# Patient Record
Sex: Male | Born: 1945
Health system: Southern US, Community
[De-identification: ages and names within clinical notes are randomized; demographics above are authoritative.]

## PROBLEM LIST (undated history)

## (undated) DIAGNOSIS — E119 Type 2 diabetes mellitus without complications: Secondary | ICD-10-CM

## (undated) DIAGNOSIS — I639 Cerebral infarction, unspecified: Secondary | ICD-10-CM

## (undated) DIAGNOSIS — C4490 Unspecified malignant neoplasm of skin, unspecified: Secondary | ICD-10-CM

## (undated) DIAGNOSIS — H269 Unspecified cataract: Secondary | ICD-10-CM

## (undated) DIAGNOSIS — H3320 Serous retinal detachment, unspecified eye: Secondary | ICD-10-CM

## (undated) HISTORY — DX: Cerebral infarction, unspecified: I63.9

## (undated) HISTORY — DX: Serous retinal detachment, unspecified eye: H33.20

## (undated) HISTORY — PX: TONSILLECTOMY: SUR1361

## (undated) HISTORY — DX: Unspecified cataract: H26.9

## (undated) HISTORY — PX: OTHER SURGICAL HISTORY: SHX169

## (undated) HISTORY — DX: Unspecified malignant neoplasm of skin, unspecified: C44.90

## (undated) HISTORY — PX: EYE SURGERY: SHX253

## (undated) HISTORY — DX: Type 2 diabetes mellitus without complications: E11.9

## (undated) SURGERY — Surgical Case
Anesthesia: *Unknown

---

## 1998-01-15 ENCOUNTER — Encounter: Admission: RE | Admit: 1998-01-15 | Discharge: 1998-04-15 | Payer: Self-pay | Admitting: Pulmonary Disease

## 2000-09-08 HISTORY — PX: COLONOSCOPY: SHX174

## 2001-01-21 ENCOUNTER — Other Ambulatory Visit: Admission: RE | Admit: 2001-01-21 | Discharge: 2001-01-21 | Payer: Self-pay | Admitting: Gastroenterology

## 2001-01-21 ENCOUNTER — Encounter (INDEPENDENT_AMBULATORY_CARE_PROVIDER_SITE_OTHER): Payer: Self-pay | Admitting: Specialist

## 2001-04-14 ENCOUNTER — Encounter: Payer: Self-pay | Admitting: Emergency Medicine

## 2001-04-14 ENCOUNTER — Emergency Department (HOSPITAL_COMMUNITY): Admission: EM | Admit: 2001-04-14 | Discharge: 2001-04-14 | Payer: Self-pay | Admitting: Emergency Medicine

## 2001-04-16 ENCOUNTER — Ambulatory Visit (HOSPITAL_COMMUNITY): Admission: RE | Admit: 2001-04-16 | Discharge: 2001-04-16 | Payer: Self-pay | Admitting: Urology

## 2001-04-16 ENCOUNTER — Encounter: Payer: Self-pay | Admitting: Urology

## 2001-04-22 ENCOUNTER — Encounter: Payer: Self-pay | Admitting: Urology

## 2001-04-22 ENCOUNTER — Ambulatory Visit (HOSPITAL_COMMUNITY): Admission: RE | Admit: 2001-04-22 | Discharge: 2001-04-22 | Payer: Self-pay | Admitting: Urology

## 2008-01-11 ENCOUNTER — Emergency Department (HOSPITAL_COMMUNITY): Admission: EM | Admit: 2008-01-11 | Discharge: 2008-01-11 | Payer: Self-pay | Admitting: Emergency Medicine

## 2009-09-27 ENCOUNTER — Encounter: Admission: RE | Admit: 2009-09-27 | Discharge: 2009-09-27 | Payer: Self-pay | Admitting: Internal Medicine

## 2009-11-01 ENCOUNTER — Encounter: Payer: Self-pay | Admitting: Internal Medicine

## 2010-09-29 ENCOUNTER — Encounter: Payer: Self-pay | Admitting: Internal Medicine

## 2010-10-08 NOTE — Letter (Signed)
Summary: Pre Visit No Show Letter  [facility]  [address] [place], [place] [address]   Phone: [phone]  Fax: [phone]        November 01, 2009 MRN: [id number]    [facility] [place] [address] [place], [place]  [address]    Dear Mr. Wangerin,   We have been unable to reach you by phone concerning the pre-procedure visit that you missed on 11/01/09. For this reason,your procedure scheduled on 11/15/09 has been cancelled. Our scheduling staff will gladly assist you with rescheduling your appointments at a more convenient time. Please call our office at [phone] between the hours of 8:00am and 5:00pm, press option #2 to reach an appointment scheduler. Please consider updating your contact numbers at this time so that we can reach you by phone in the future with schedule changes or results.    Thank you,    Wyona Almas RN [facility]

## 2011-01-24 NOTE — Op Note (Signed)
[facility]  Patient:    James Blevins, James Blevins                     MRN: [id number] Proc. Date: 04/16/01 Adm. Date:  [id number] Attending:  Ellwood Handler                           Operative Report  DATE OF BIRTH:  23-Mar-1946  REFERRING PHYSICIAN:  ______  Margarette AsalVerl Dicker, M.D.  PREOPERATIVE DIAGNOSES:  A 65 year old diabetic with 7 mm left UPJ stone, 5 mm left renal stone.  POSTOPERATIVE DIAGNOSES:  A 65 year old diabetic with 7 mm left UPJ stone, 5 mm left renal stone.  PROCEDURE:  Cystoscopy, retrograde, left double J stent placement.  ANESTHESIA:  General.  DRAINS:  A 6 French 26 cm double J stent.  DESCRIPTION OF PROCEDURE:  The patient was prepped and draped in the dorsal lithotomy position after institution of an adequate level of general anesthesia. A well lubricated 21 French panendoscope was gently inserted at the urethral meatus, normal urethra and sphincter, nonobstructive prostate. The bladder showed a normal trigone, orifices and urothelium. Right retrograde showed normal course and caliber of the ureter, pelvis, and calices with prompt drainage of 3-5 minutes. Left retrograde showed tightly impacted 7 mm calculus in the region of the left UPJ with smaller 5 mm calculus in the mid pole of the left kidney. Gentle injection of contrast displaced the left UPJ stone into the left mid pole with prompt efflux of concentrated urine at the left ureteral orifice. The guidewire was advanced into the upper pole calices. A 6 French 26 cm double J stent was then advanced over the indwelling guidewire with excellent pigtail formation on guidewire removal. The bladder was drained. The cystoscope was removed and the patient was returned to recovery in satisfactory condition. DD:  04/16/01 TD:  04/16/01 Job: [id number]

## 2011-10-13 ENCOUNTER — Other Ambulatory Visit: Payer: Self-pay | Admitting: Dermatology

## 2012-05-25 ENCOUNTER — Other Ambulatory Visit: Payer: Self-pay | Admitting: Dermatology

## 2013-10-10 ENCOUNTER — Encounter: Payer: Self-pay | Admitting: Internal Medicine

## 2013-10-24 ENCOUNTER — Other Ambulatory Visit: Payer: Self-pay | Admitting: Dermatology

## 2013-11-08 ENCOUNTER — Ambulatory Visit (AMBULATORY_SURGERY_CENTER): Payer: Self-pay | Admitting: *Deleted

## 2013-11-08 VITALS — Ht 72.0 in | Wt 187.8 lb

## 2013-11-08 DIAGNOSIS — Z1211 Encounter for screening for malignant neoplasm of colon: Secondary | ICD-10-CM

## 2013-11-08 MED ORDER — MOVIPREP 100 G PO SOLR: 1.0000 | Freq: Once | ORAL | Status: DC

## 2013-11-08 NOTE — Progress Notes (Signed)
Denies allergies to eggs or soy products. Denies complications with sedation or anesthesia. 

## 2013-11-11 ENCOUNTER — Encounter: Payer: Self-pay | Admitting: Internal Medicine

## 2013-11-17 ENCOUNTER — Encounter: Payer: Self-pay | Admitting: Internal Medicine

## 2013-11-17 ENCOUNTER — Ambulatory Visit (AMBULATORY_SURGERY_CENTER): Payer: Medicare Other | Admitting: Internal Medicine

## 2013-11-17 VITALS — BP 163/96 | HR 72 | Temp 97.9°F | Resp 17 | Ht 72.0 in | Wt 187.0 lb

## 2013-11-17 DIAGNOSIS — D126 Benign neoplasm of colon, unspecified: Secondary | ICD-10-CM

## 2013-11-17 DIAGNOSIS — Z86010 Personal history of colonic polyps: Secondary | ICD-10-CM

## 2013-11-17 DIAGNOSIS — Z1211 Encounter for screening for malignant neoplasm of colon: Secondary | ICD-10-CM

## 2013-11-17 LAB — GLUCOSE, CAPILLARY
GLUCOSE-CAPILLARY: 243 mg/dL — AB (ref 70–99)
GLUCOSE-CAPILLARY: 249 mg/dL — AB (ref 70–99)
Glucose-Capillary: 206 mg/dL — ABNORMAL HIGH (ref 70–99)

## 2013-11-17 MED ORDER — SODIUM CHLORIDE 0.9 % IV SOLN: 500.0000 mL | INTRAVENOUS | Status: DC

## 2013-11-17 NOTE — Patient Instructions (Signed)

## 2013-11-17 NOTE — Progress Notes (Signed)
Procedure ends. To recovery, report given and VSS. 

## 2013-11-17 NOTE — Op Note (Signed)
[facility]  [address] [employer] [address] [place], [address]   COLONOSCOPY PROCEDURE REPORT  PATIENT: James, Blevins  MR#: [id number] BIRTHDATE: 04/30/1946 , 67  yrs. old GENDER: Male ENDOSCOPIST: Eustace Quail, MD REFERRED DU:[id number] Avva, M.D. PROCEDURE DATE:  11/17/2013 PROCEDURE:   Colonoscopy with snare polypectomy x 1 First Screening Colonoscopy - Avg.  risk and is 50 yrs.  old or older - No.  Prior Negative Screening - Now for repeat screening. N/A  History of Adenoma - Now for follow-up colonoscopy & has been > or = to 3 yrs.  Yes hx of adenoma.  Has been 3 or more years since last colonoscopy.  Polyps Removed Today? Yes. ASA CLASS:   Class II INDICATIONS:Patient's personal history of colon polyps. Index 2002 [facility]) small adenomas MEDICATIONS: MAC sedation, administered by CRNA and propofol (Diprivan) 200mg  IV  DESCRIPTION OF PROCEDURE:   After the risks benefits and alternatives of the procedure were thoroughly explained, informed consent was obtained.  A digital rectal exam revealed no abnormalities of the rectum.   The LB [id number]  endoscope was introduced through the anus and advanced to the cecum, which was identified by both the appendix and ileocecal valve. No adverse events experienced.   The quality of the prep was adequate, using MoviPrep  The instrument was then slowly withdrawn as the colon was fully examined.   COLON FINDINGS: A sessile polyp measuring 7 mm in size was found in the ascending colon.  A polypectomy was performed with a cold snare.  The resection was complete and the polyp tissue was completely retrieved.   The colon mucosa was otherwise normal. Retroflexed views revealed internal hemorrhoids. The time to cecum=3 minutes 16 seconds.  Withdrawal time=12 minutes 43 seconds. The scope was withdrawn and the procedure completed. COMPLICATIONS: There were no complications.  ENDOSCOPIC IMPRESSION: 1.   Sessile polyp measuring 7  mm in size was found in the ascending colon; polypectomy was performed with a cold snare 2.   The colon mucosa was otherwise normal  RECOMMENDATIONS: 1. Follow up colonoscopy in 5 years   eSigned:  Eustace Quail, MD 11/17/2013 12:23 PM   cc: [name], MD and The Patient

## 2013-11-17 NOTE — Progress Notes (Signed)
Called to room to assist during endoscopic procedure.  Patient ID and intended procedure confirmed with present staff. Received instructions for my participation in the procedure from the performing physician.  

## 2013-11-18 ENCOUNTER — Telehealth: Payer: Self-pay | Admitting: *Deleted

## 2013-11-18 NOTE — Telephone Encounter (Signed)
Left message that we called for f/u 

## 2013-11-23 ENCOUNTER — Encounter: Payer: Self-pay | Admitting: Internal Medicine

## 2015-08-15 ENCOUNTER — Ambulatory Visit: Payer: Medicare Other | Attending: Neurology

## 2015-08-15 DIAGNOSIS — R41841 Cognitive communication deficit: Secondary | ICD-10-CM | POA: Diagnosis present

## 2015-08-15 NOTE — Therapy (Signed)
[facility] [address] [place], [place], [address] Phone: [phone]   Fax:  [phone]  Speech Language Pathology Evaluation  Patient Details  Name: James Blevins MRN: [id number] Date of Birth: 01/31/1946 Referring Provider: Joyce Copa M.D.  Encounter Date: 08/15/2015      End of Session - 08/15/15 1212    Visit Number 1   Number of Visits 17   Date for SLP Re-Evaluation 10/15/15   Authorization Type blue medicare   SLP Start Time 4   SLP Stop Time  1101   SLP Time Calculation (min) 42 min   Activity Tolerance Patient tolerated treatment well      Past Medical History  Diagnosis Date  . Diabetes ([place])   . Skin cancer   . Cataracts, bilateral   . Retinal detachment left eye, 08/2013  . Stroke [facility])     Past Surgical History  Procedure Laterality Date  . Tonsillectomy  childhood  . Colonoscopy  2002  . Skin cancer removal    . Eye surgery      There were no vitals filed for this visit.  Visit Diagnosis: Cognitive communication deficit      Subjective Assessment - 08/15/15 1029    Subjective "Not anything with my speech." "This last Friday - Friday before Thanksgiving."   Patient is accompained by: Family member            SLP Evaluation [facility] - 08/15/15 1028    SLP Visit Information   SLP Received On 08/15/15   Referring Provider Joyce Copa M.D.   Onset Date 08-03-15   Medical Diagnosis CVA   Prior Functional Status   Cognitive/Linguistic Baseline Within functional limits    Lives With Spouse   Available Support Family   Vocation Full time employment   Cognition   Overall Cognitive Status Impaired/Different from baseline   Area of Impairment Awareness;Memory;Problem solving   Memory Impaired   Memory Impairment Retrieval deficit;Decreased recall of new information;Decreased short term memory   Decreased Short Term Memory Verbal basic  SLP told pt he was SLP, pt recalled SLP  was OT 15 min later   Awareness Impaired   Awareness Impairment Intellectual impairment  intermittent emergent awareness   Problem Solving Impaired   Problem Solving Impairment Verbal basic;Verbal complex;Functional basic  difficulty solving necessary corrections with clock drawing   Behaviors Impulsive   Auditory Comprehension   Overall Auditory Comprehension Appears within functional limits for tasks assessed   Verbal Expression   Overall Verbal Expression Appears within functional limits for tasks assessed   Oral Motor/Sensory Function   Overall Oral Motor/Sensory Function Appears within functional limits for tasks assessed   Motor Speech   Overall Motor Speech Appears within functional limits for tasks assessed   Standardized Assessments   Standardized Assessments  Montreal Cognitive Assessment (MOCA)   Montreal Cognitive Assessment (MOCA)  21/30 (0/6 memory)                         SLP Education - 08/15/15 1211    Education provided Yes   Education Details deficit areas, therapy course, suggestion of OT referral   Person(s) Educated Patient;Spouse   Methods Explanation   Comprehension Verbalized understanding          SLP Short Term Goals - 08/15/15 1214    SLP SHORT TERM GOAL #1   Title pt will tell SLP of 4/4 memory strategies with modified independence  Time 4   Period Weeks   Status New   SLP SHORT TERM GOAL #2   Title pt will demo verbal problem solving skills adequate for 90% success in written simple deductive reasoning tasks   Time 4   Period Weeks   Status New   SLP SHORT TERM GOAL #3   Title pt will tell SLP 3 non-physical deficits   Time 4   Period Weeks   Status New          SLP Long Term Goals - 08/25/2015 1216    SLP LONG TERM GOAL #1   Title pt will demo awareness skills appropriate for anticipatory verbal safety awareness regarding deficit areas   Time 8   Period Weeks   Status New   SLP LONG TERM GOAL #2   Title pt  will utilize memory strategies/techniques twice in two sessions, or reported by famly/pt between three therapy sessions   Time 8   Period Weeks   Status New   SLP LONG TERM GOAL #3   Title pt will demo two weeks of error-free medication administration with modified independence   Time 8   Period Weeks   Status New          Plan - August 25, 2015 1210/12/19    Clinical Impression Statement Pt presents with mild-mod cognitive-linguistic deficits in the areas of memory, awareness, and problem solving. He would benefit from skilled ST to address these deficits to lessen caregiver burden and to return skills to a level he could return to work as a [employer].    Speech Therapy Frequency 2x / week   Duration --  8 weeks   Treatment/Interventions Cognitive reorganization;SLP instruction and feedback;Compensatory strategies;Internal/external aids;Patient/family education;Functional tasks;Cueing hierarchy   Potential to Achieve Goals Good          G-Codes - August 25, 2015 December 18, 1216    Functional Assessment Tool Used noms -5 (40-45% impaired)   Functional Limitations Memory   Memory Current Status [id number]) At least 40 percent but less than 60 percent impaired, limited or restricted   Memory Goal Status [id number]) At least 1 percent but less than 20 percent impaired, limited or restricted      Problem List There are no active problems to display for this patient.   [facility] , [place], [place]  2015/08/25, 12:20 PM  [facility] [address] [place], [place], [address] Phone: [phone]   Fax:  [phone]  Name: James Blevins MRN: [id number] Date of Birth: May 08, 1946

## 2015-08-15 NOTE — Patient Instructions (Signed)
Sit with James Blevins next time the medication box needs a refill and do it together

## 2015-08-17 NOTE — Addendum Note (Signed)
Addended by: Garald Balding B on: 08/17/2015 05:26 PM   Modules accepted: Orders

## 2015-08-21 ENCOUNTER — Ambulatory Visit: Payer: Medicare Other

## 2015-08-21 NOTE — Addendum Note (Signed)
Addended by: Garald Balding B on: 08/21/2015 11:49 AM   Modules accepted: Orders

## 2015-08-24 ENCOUNTER — Ambulatory Visit: Payer: Medicare Other

## 2015-08-28 ENCOUNTER — Ambulatory Visit: Payer: Medicare Other

## 2015-08-28 DIAGNOSIS — R41841 Cognitive communication deficit: Secondary | ICD-10-CM

## 2015-08-28 NOTE — Patient Instructions (Signed)
You will need to take some extra time on the front end, for tasks you have not done before, or with tasks you have not completed in a while.  You will need to double check your work as you are making mistakes you didn't make before. You will find any errors you make, just check your work.

## 2015-08-28 NOTE — Therapy (Signed)
[facility] [address] [place], [place], [address] Phone: [phone]   Fax:  [phone]  Speech Language Pathology Treatment  Patient Details  Name: James Blevins MRN: [id number] Date of Birth: [birth year] Referring Provider: Joyce Copa M.D.  Encounter Date: 08/28/2015      End of Session - 08/28/15 1152    Visit Number 2   Number of Visits 17   Date for SLP Re-Evaluation 10/15/15   Authorization Type blue medicare   Authorization - Visit Number 1   Authorization - Number of Visits 16   SLP Start Time [id number]   SLP Stop Time  1145   SLP Time Calculation (min) 42 min   Activity Tolerance Patient tolerated treatment well      Past Medical History  Diagnosis Date  . Diabetes ([place])   . Skin cancer   . Cataracts, bilateral   . Retinal detachment left eye, 08/2013  . Stroke [facility])     Past Surgical History  Procedure Laterality Date  . Tonsillectomy  childhood  . Colonoscopy  2002  . Skin cancer removal    . Eye surgery      There were no vitals filed for this visit.  Visit Diagnosis: Cognitive communication deficit      Subjective Assessment - 08/28/15 1108    Patient is accompained by: Family member  wife               ADULT SLP TREATMENT - 08/28/15 1108    General Information   Behavior/Cognition Cooperative;Pleasant mood;Alert   Treatment Provided   Treatment provided Cognitive-Linquistic   Pain Assessment   Pain Assessment No/denies pain   Cognitive-Linquistic Treatment   Treatment focused on Cognition   Skilled Treatment Simple organization task to work with pt on cognitive linguistics (categorizating words and holidays). With SLP providing pt 4-5 letter words, change combinations, and simple conversation he exhibited divided attention 80% of the time, however accuracy on holidays task was <50% due to an error in dates. Pt caught errors to improve success to 90% with cue to double  check his work. This task took pt 19 minutes to complete including corrections. Pt did not recall two details of conversation from 5 minutes earlier.   Assessment / Recommendations / Plan   Plan Continue with current plan of care   Progression Toward Goals   Progression toward goals Progressing toward goals          SLP Education - 08/28/15 1150    Education provided Yes   Education Details deficit areas, need to double check tasks   Person(s) Educated Patient;Spouse   Methods Explanation   Comprehension Verbalized understanding          SLP Short Term Goals - 08/28/15 1155    SLP SHORT TERM GOAL #1   Title pt will tell SLP of 4/4 memory strategies with modified independence   Time 4   Period Weeks   Status On-going   SLP SHORT TERM GOAL #2   Title pt will demo verbal problem solving skills adequate for 90% success in written simple deductive reasoning tasks   Time 4   Period Weeks   Status On-going   SLP SHORT TERM GOAL #3   Title pt will tell SLP 3 non-physical deficits   Time 4   Period Weeks   Status On-going          SLP Long Term Goals - 08/28/15 1156    SLP LONG TERM  GOAL #1   Title pt will demo awareness skills appropriate for anticipatory verbal safety awareness regarding deficit areas   Time 8   Period Weeks   Status On-going   SLP LONG TERM GOAL #2   Title pt will utilize memory strategies/techniques twice in two sessions, or reported by famly/pt between three therapy sessions   Time 8   Period Weeks   Status On-going   SLP LONG TERM GOAL #3   Title pt will demo two weeks of error-free medication administration with modified independence   Time 8   Period Weeks   Status On-going          Plan - 08/28/15 1152    Clinical Impression Statement Deficits continue in cognitive-linguistics and pt would continue to benefit from skilled ST in order to return pt to a level he may be able to return to work.   Speech Therapy Frequency 2x / week    Duration --  8 weeks   Treatment/Interventions Cognitive reorganization;SLP instruction and feedback;Compensatory strategies;Internal/external aids;Patient/family education;Functional tasks;Cueing hierarchy   Potential to Achieve Goals Good        Problem List There are no active problems to display for this patient.   [facility] , [place], [place]  08/28/2015, 11:57 AM  [facility] [address] [place], [place], [address] Phone: [phone]   Fax:  [phone]   Name: James Blevins MRN: [id number] Date of Birth: [birth year]

## 2015-08-30 ENCOUNTER — Ambulatory Visit: Payer: Medicare Other

## 2015-08-30 DIAGNOSIS — R41841 Cognitive communication deficit: Secondary | ICD-10-CM | POA: Diagnosis not present

## 2015-08-30 NOTE — Patient Instructions (Signed)
  Please complete the assigned speech therapy homework and return it to your next session.  

## 2015-08-30 NOTE — Therapy (Signed)
[facility] [address] [place], [place], [address] Phone: [phone]   Fax:  [phone]  Speech Language Pathology Treatment  Patient Details  Name: James Blevins MRN: [id number] Date of Birth: [birth year] Referring Provider: Joyce Copa M.D.  Encounter Date: 08/30/2015      End of Session - 08/30/15 1627    Visit Number 3   Number of Visits 17   Date for SLP Re-Evaluation 10/15/15   Authorization Type blue medicare   Authorization - Visit Number 2   Authorization - Number of Visits 16   SLP Start Time [id number]   SLP Stop Time  1615   SLP Time Calculation (min) 42 min   Activity Tolerance Patient tolerated treatment well      Past Medical History  Diagnosis Date  . Diabetes ([place])   . Skin cancer   . Cataracts, bilateral   . Retinal detachment left eye, 08/2013  . Stroke [facility])     Past Surgical History  Procedure Laterality Date  . Tonsillectomy  childhood  . Colonoscopy  2002  . Skin cancer removal    . Eye surgery      There were no vitals filed for this visit.  Visit Diagnosis: Cognitive communication deficit      Subjective Assessment - 08/30/15 1537    Subjective Pt is keeping track of own meds.   Patient is accompained by: --  wife               ADULT SLP TREATMENT - 08/30/15 1539    General Information   Behavior/Cognition Cooperative;Pleasant mood;Alert   Treatment Provided   Treatment provided Cognitive-Linquistic   Pain Assessment   Pain Assessment No/denies pain   Cognitive-Linquistic Treatment   Treatment focused on Cognition   Skilled Treatment Pt brought homework back to SLP, double-checked. Pt cont to have errors on simple task which req'd mod attention to detail. Pt found errors on one of two homework tasks within 60 seconds of checking again. In simple reasoning (garden plots) pt did not require cues. In an alternating attention task, pt was 83% successful with  emergent awareness at 100% with min SLP cues, and consistent extra time.    Assessment / Recommendations / Plan   Plan Continue with current plan of care   Progression Toward Goals   Progression toward goals Progressing toward goals            SLP Short Term Goals - 08/30/15 1628    SLP SHORT TERM GOAL #1   Title pt will tell SLP of 4/4 memory strategies with modified independence   Time 3   Period Weeks   Status On-going   SLP SHORT TERM GOAL #2   Title pt will demo verbal problem solving skills adequate for 90% success in written simple deductive reasoning tasks   Time 3   Period Weeks   Status On-going   SLP SHORT TERM GOAL #3   Title pt will tell SLP 3 non-physical deficits   Time 3   Period Weeks   Status On-going          SLP Long Term Goals - 08/30/15 1628    SLP LONG TERM GOAL #1   Title pt will demo awareness skills appropriate for anticipatory verbal safety awareness regarding deficit areas   Time 7   Period Weeks   Status On-going   SLP LONG TERM GOAL #2   Title pt will utilize memory strategies/techniques twice in two  sessions, or reported by famly/pt between three therapy sessions   Time 7   Period Weeks   Status On-going   SLP LONG TERM GOAL #3   Title pt will demo two weeks of error-free medication administration with modified independence   Time 7   Period Weeks   Status On-going          Plan - 08/30/15 1627    Clinical Impression Statement Deficits continue in cognitive-linguistics and pt would continue to benefit from skilled ST in order to return pt to a level he may be able to return to work.   Speech Therapy Frequency 2x / week   Duration --  7 weeks   Treatment/Interventions Cognitive reorganization;SLP instruction and feedback;Compensatory strategies;Internal/external aids;Patient/family education;Functional tasks;Cueing hierarchy   Potential to Achieve Goals Good        Problem List There are no active problems to display for  this patient.   [facility] , [place], [place] 08/30/2015, 4:29 PM  [facility] [address] [place], [place], [address] Phone: [phone]   Fax:  [phone]   Name: James Blevins MRN: [id number] Date of Birth: 25-Feb-1946

## 2015-09-05 ENCOUNTER — Ambulatory Visit: Payer: Medicare Other

## 2015-09-05 DIAGNOSIS — R41841 Cognitive communication deficit: Secondary | ICD-10-CM

## 2015-09-05 NOTE — Patient Instructions (Signed)
  Please complete the assigned speech therapy homework and return it to your next session.  

## 2015-09-05 NOTE — Therapy (Signed)
[facility] [address] [place], [place], [address] Phone: [phone]   Fax:  [phone]  Speech Language Pathology Treatment  Patient Details  Name: James Blevins MRN: [id number] Date of Birth: May 18, 1946 Referring Provider: Joyce Copa M.D.  Encounter Date: 09/05/2015      End of Session - 09/05/15 1655    Visit Number 4   Number of Visits 17   Date for SLP Re-Evaluation 10/15/15   Authorization Type blue medicare   Authorization Time Period until 10-25-15   Authorization - Visit Number 3   Authorization - Number of Visits 16   SLP Start Time [id number]   SLP Stop Time  1531   SLP Time Calculation (min) 33 min   Activity Tolerance Patient tolerated treatment well      Past Medical History  Diagnosis Date  . Diabetes ([place])   . Skin cancer   . Cataracts, bilateral   . Retinal detachment left eye, 08/2013  . Stroke [facility])     Past Surgical History  Procedure Laterality Date  . Tonsillectomy  childhood  . Colonoscopy  2002  . Skin cancer removal    . Eye surgery      There were no vitals filed for this visit.  Visit Diagnosis: Cognitive communication deficit             ADULT SLP TREATMENT - 09/05/15 1501    General Information   Behavior/Cognition Cooperative;Pleasant mood;Alert   Treatment Provided   Treatment provided Cognitive-Linquistic   Pain Assessment   Pain Assessment No/denies pain   Cognitive-Linquistic Treatment   Treatment focused on Cognition   Skilled Treatment Pt again brought the same corrected homework back (again) with only one error. In other detailed tasks, pt req'd min A rarely to decr. impulsivity/incr emergent awareness. SLP incr'd cognitive difficulty of the task by having pt copmlete deductive reasoning puzzle (simple) with min-mod SLP A occasionally, and then another (same format) that req'd mod SLP A consistently, with all deductive clues. SLP targeted verbal  cognitive skills by having pt tell similarities/differences between two concrete items. Pt was 90% successful but req'd cues occasionally to just state similarities or differences, not both at one time.   Assessment / Recommendations / Plan   Plan Continue with current plan of care   Progression Toward Goals   Progression toward goals Progressing toward goals            SLP Short Term Goals - 09/05/15 1656    SLP SHORT TERM GOAL #1   Title pt will tell SLP of 4/4 memory strategies with modified independence   Time 2   Period Weeks   Status On-going   SLP SHORT TERM GOAL #2   Title pt will demo verbal problem solving skills adequate for 90% success in written simple deductive reasoning tasks   Time 2   Period Weeks   Status On-going   SLP SHORT TERM GOAL #3   Title pt will tell SLP 3 non-physical deficits   Time 2   Period Weeks   Status On-going          SLP Long Term Goals - 09/05/15 1657    SLP LONG TERM GOAL #1   Title pt will demo awareness skills appropriate for anticipatory verbal safety awareness regarding deficit areas   Time 6   Period Weeks   Status On-going   SLP LONG TERM GOAL #2   Title pt will utilize memory strategies/techniques twice in  two sessions, or reported by famly/pt between three therapy sessions   Time 6   Period Weeks   Status On-going   SLP LONG TERM GOAL #3   Title pt will demo two weeks of error-free medication administration with modified independence   Time 6   Period Weeks   Status On-going          Plan - 09/05/15 1656    Clinical Impression Statement Deficits continue in cognitive-linguistics and pt would continue to benefit from skilled ST in order to return pt to a level he may be able to return to work.   Speech Therapy Frequency 2x / week   Duration --  6 weeks   Treatment/Interventions Cognitive reorganization;SLP instruction and feedback;Compensatory strategies;Internal/external aids;Patient/family education;Functional  tasks;Cueing hierarchy   Potential to Achieve Goals Good        Problem List There are no active problems to display for this patient.   [facility] , [place], [place] 09/05/2015, 4:58 PM  [facility] [address] [place], [place], [address] Phone: [phone]   Fax:  [phone]   Name: James Blevins MRN: [id number] Date of Birth: [birth year]

## 2015-09-07 ENCOUNTER — Ambulatory Visit: Payer: Medicare Other

## 2015-09-07 ENCOUNTER — Telehealth: Payer: Self-pay

## 2015-09-07 NOTE — Therapy (Signed)
[facility] [address] [place], [place], [address] Phone: [phone]   Fax:  [phone]  Patient Details  Name: James Blevins MRN: [id number] Date of Birth: 03-Aug-1946 Referring Provider:  Jodi Marble, M.D.  Encounter Date: 09/07/2015   Pt's wife called this office this afternoon, informing office staff that pt wished to cancel remaining appointments. According to patient, his neurologist gave him the option whether or not to continue with ST, and the patient has chosen not to continue.   He will be discharged. Summary to follow.  [facility] , [place], [place] 09/07/2015, 5:30 PM  [facility] [address] [place], [place], [address] Phone: [phone]   Fax:  [phone]

## 2015-09-07 NOTE — Telephone Encounter (Signed)
Spoke with pt's wife who answered phone. Informed  her of pt's no-show. She informed SLP pt saw neurologist yesterday and he stated pt could not drive for 6 months. She told SLP pt would be at his next visit.

## 2015-09-11 ENCOUNTER — Ambulatory Visit: Payer: Medicare Other

## 2015-09-11 NOTE — Therapy (Signed)
[facility] [address] [place], [place], [address] Phone: [phone]   Fax:  [phone]  Patient Details  Name: James Blevins MRN: [id number] Date of Birth: Mar 24, 1946 Referring Provider:  No ref. provider found  Encounter Date: 09/11/2015   Pt's wife called last week and informed front office staff that pt told her that during pt's appointment with neurologist last week, neurologist gave pt the option whether to cont ST was up to the patient and pt has chosen not to continue with ST.  He was seen for three therapy visits. Verbal organization skills have improved over three sesions. His abilities in attention improved, but SLP does not believe higher level attention skills are yet at pt's premorid baseline level. Error awareness in cognitive-linguistic tasks cont to require SLP assistance throughout therapy course. Although pt increased his frequency of checking over his written responses, errors sometimes remained. It is believed that pt's awareness for deficit areas had increased over the three visits mainly due to intermittent "planned failures" by SLP during therapy sessions.  No short term or long term therapy goals were met. However, STGs 1 and 3 were not measured in therapy due to focus on attention and other cognitive-linguistic skills in order to incr pt's awareness of deficits, STG #3 (see above paragraph). Long term goals were not measured due to the patient not returning for therapy.   Pt was discharged due to his request. From a cognitive-linguistic standpoint, it is not recommended pt drive until he undergoes a driving evaluation with a specialist conducting [place] driving evaluations, or with a certified professional (possibly an occupational therapist) providing driving evaluations.   SLP SHORT TERM GOAL #1      Title  pt will tell SLP of 4/4 memory strategies with modified independence     Time  2     Period   Weeks     Status  On-going     SLP SHORT TERM GOAL #2     Title  pt will demo verbal problem solving skills adequate for 90% success in written simple deductive reasoning tasks     Time  2     Period  Weeks     Status  On-going     SLP SHORT TERM GOAL #3     Title  pt will tell SLP 3 non-physical deficits     Time  2     Period  Weeks     Status  On-going                SLP Long Term Goals - 09/05/15 1657     SLP LONG TERM GOAL #1     Title  pt will demo awareness skills appropriate for anticipatory verbal safety awareness regarding deficit areas     Time  6     Period  Weeks     Status  On-going     SLP LONG TERM GOAL #2     Title  pt will utilize memory strategies/techniques twice in two sessions, or reported by famly/pt between three therapy sessions     Time  6     Period  Weeks     Status  On-going     SLP LONG TERM GOAL #3     Title  pt will demo two weeks of error-free medication administration with modified independence     Time  6     Period  Weeks     Status  On-going       [facility] , [place], [place]   09/11/2015, 11:06 AM  [facility] [address] [place], [place], [address] Phone: [phone]   Fax:  [phone]

## 2015-10-24 DIAGNOSIS — H26492 Other secondary cataract, left eye: Secondary | ICD-10-CM | POA: Diagnosis not present

## 2015-10-24 DIAGNOSIS — E113412 Type 2 diabetes mellitus with severe nonproliferative diabetic retinopathy with macular edema, left eye: Secondary | ICD-10-CM | POA: Diagnosis not present

## 2015-10-24 DIAGNOSIS — H43812 Vitreous degeneration, left eye: Secondary | ICD-10-CM | POA: Diagnosis not present

## 2015-10-31 DIAGNOSIS — E11319 Type 2 diabetes mellitus with unspecified diabetic retinopathy without macular edema: Secondary | ICD-10-CM | POA: Diagnosis not present

## 2015-10-31 DIAGNOSIS — E784 Other hyperlipidemia: Secondary | ICD-10-CM | POA: Diagnosis not present

## 2015-10-31 DIAGNOSIS — Z125 Encounter for screening for malignant neoplasm of prostate: Secondary | ICD-10-CM | POA: Diagnosis not present

## 2015-10-31 DIAGNOSIS — I10 Essential (primary) hypertension: Secondary | ICD-10-CM | POA: Diagnosis not present

## 2015-11-21 DIAGNOSIS — R569 Unspecified convulsions: Secondary | ICD-10-CM | POA: Diagnosis not present

## 2015-11-21 DIAGNOSIS — Z1389 Encounter for screening for other disorder: Secondary | ICD-10-CM | POA: Diagnosis not present

## 2015-11-21 DIAGNOSIS — E11319 Type 2 diabetes mellitus with unspecified diabetic retinopathy without macular edema: Secondary | ICD-10-CM | POA: Diagnosis not present

## 2015-11-21 DIAGNOSIS — I638 Other cerebral infarction: Secondary | ICD-10-CM | POA: Diagnosis not present

## 2015-11-21 DIAGNOSIS — R808 Other proteinuria: Secondary | ICD-10-CM | POA: Diagnosis not present

## 2015-11-21 DIAGNOSIS — C439 Malignant melanoma of skin, unspecified: Secondary | ICD-10-CM | POA: Diagnosis not present

## 2015-11-21 DIAGNOSIS — Z1212 Encounter for screening for malignant neoplasm of rectum: Secondary | ICD-10-CM | POA: Diagnosis not present

## 2015-11-21 DIAGNOSIS — I10 Essential (primary) hypertension: Secondary | ICD-10-CM | POA: Diagnosis not present

## 2015-11-21 DIAGNOSIS — E784 Other hyperlipidemia: Secondary | ICD-10-CM | POA: Diagnosis not present

## 2015-11-21 DIAGNOSIS — Z0000 Encounter for general adult medical examination without abnormal findings: Secondary | ICD-10-CM | POA: Diagnosis not present

## 2015-11-21 DIAGNOSIS — H3500 Unspecified background retinopathy: Secondary | ICD-10-CM | POA: Diagnosis not present

## 2015-11-27 ENCOUNTER — Other Ambulatory Visit (HOSPITAL_COMMUNITY): Payer: Self-pay | Admitting: Interventional Radiology

## 2015-11-27 DIAGNOSIS — I651 Occlusion and stenosis of basilar artery: Secondary | ICD-10-CM

## 2015-12-03 DIAGNOSIS — R7989 Other specified abnormal findings of blood chemistry: Secondary | ICD-10-CM | POA: Diagnosis not present

## 2015-12-03 DIAGNOSIS — Z7982 Long term (current) use of aspirin: Secondary | ICD-10-CM | POA: Diagnosis not present

## 2015-12-03 DIAGNOSIS — E785 Hyperlipidemia, unspecified: Secondary | ICD-10-CM | POA: Diagnosis not present

## 2015-12-03 DIAGNOSIS — I10 Essential (primary) hypertension: Secondary | ICD-10-CM | POA: Diagnosis not present

## 2015-12-03 DIAGNOSIS — R930 Abnormal findings on diagnostic imaging of skull and head, not elsewhere classified: Secondary | ICD-10-CM | POA: Diagnosis not present

## 2015-12-03 DIAGNOSIS — Z7902 Long term (current) use of antithrombotics/antiplatelets: Secondary | ICD-10-CM | POA: Diagnosis not present

## 2015-12-03 DIAGNOSIS — E1165 Type 2 diabetes mellitus with hyperglycemia: Secondary | ICD-10-CM | POA: Diagnosis not present

## 2015-12-03 DIAGNOSIS — R739 Hyperglycemia, unspecified: Secondary | ICD-10-CM | POA: Diagnosis not present

## 2015-12-03 DIAGNOSIS — I639 Cerebral infarction, unspecified: Secondary | ICD-10-CM | POA: Diagnosis not present

## 2015-12-03 DIAGNOSIS — R030 Elevated blood-pressure reading, without diagnosis of hypertension: Secondary | ICD-10-CM | POA: Diagnosis not present

## 2015-12-03 DIAGNOSIS — R42 Dizziness and giddiness: Secondary | ICD-10-CM | POA: Diagnosis not present

## 2015-12-03 DIAGNOSIS — G459 Transient cerebral ischemic attack, unspecified: Secondary | ICD-10-CM | POA: Diagnosis not present

## 2015-12-03 DIAGNOSIS — Z8673 Personal history of transient ischemic attack (TIA), and cerebral infarction without residual deficits: Secondary | ICD-10-CM | POA: Diagnosis not present

## 2015-12-03 DIAGNOSIS — R9431 Abnormal electrocardiogram [ECG] [EKG]: Secondary | ICD-10-CM | POA: Diagnosis not present

## 2015-12-03 DIAGNOSIS — Z832 Family history of diseases of the blood and blood-forming organs and certain disorders involving the immune mechanism: Secondary | ICD-10-CM | POA: Diagnosis not present

## 2015-12-04 DIAGNOSIS — I651 Occlusion and stenosis of basilar artery: Secondary | ICD-10-CM | POA: Diagnosis not present

## 2015-12-04 DIAGNOSIS — Z8673 Personal history of transient ischemic attack (TIA), and cerebral infarction without residual deficits: Secondary | ICD-10-CM | POA: Diagnosis not present

## 2015-12-04 DIAGNOSIS — G459 Transient cerebral ischemic attack, unspecified: Secondary | ICD-10-CM | POA: Diagnosis not present

## 2015-12-04 DIAGNOSIS — Z7722 Contact with and (suspected) exposure to environmental tobacco smoke (acute) (chronic): Secondary | ICD-10-CM | POA: Diagnosis not present

## 2015-12-04 DIAGNOSIS — I672 Cerebral atherosclerosis: Secondary | ICD-10-CM | POA: Diagnosis not present

## 2015-12-04 DIAGNOSIS — Z7902 Long term (current) use of antithrombotics/antiplatelets: Secondary | ICD-10-CM | POA: Diagnosis not present

## 2015-12-04 DIAGNOSIS — I10 Essential (primary) hypertension: Secondary | ICD-10-CM | POA: Diagnosis not present

## 2015-12-04 DIAGNOSIS — R2689 Other abnormalities of gait and mobility: Secondary | ICD-10-CM | POA: Diagnosis not present

## 2015-12-04 DIAGNOSIS — R42 Dizziness and giddiness: Secondary | ICD-10-CM | POA: Diagnosis not present

## 2015-12-05 DIAGNOSIS — E113411 Type 2 diabetes mellitus with severe nonproliferative diabetic retinopathy with macular edema, right eye: Secondary | ICD-10-CM | POA: Diagnosis not present

## 2015-12-05 DIAGNOSIS — E113412 Type 2 diabetes mellitus with severe nonproliferative diabetic retinopathy with macular edema, left eye: Secondary | ICD-10-CM | POA: Diagnosis not present

## 2015-12-05 DIAGNOSIS — H26491 Other secondary cataract, right eye: Secondary | ICD-10-CM | POA: Diagnosis not present

## 2015-12-05 DIAGNOSIS — H26492 Other secondary cataract, left eye: Secondary | ICD-10-CM | POA: Diagnosis not present

## 2015-12-10 ENCOUNTER — Ambulatory Visit (HOSPITAL_COMMUNITY): Payer: Medicare Other

## 2015-12-10 DIAGNOSIS — I6990 Unspecified sequelae of unspecified cerebrovascular disease: Secondary | ICD-10-CM | POA: Diagnosis not present

## 2015-12-10 DIAGNOSIS — Z8673 Personal history of transient ischemic attack (TIA), and cerebral infarction without residual deficits: Secondary | ICD-10-CM | POA: Diagnosis not present

## 2015-12-19 DIAGNOSIS — E113412 Type 2 diabetes mellitus with severe nonproliferative diabetic retinopathy with macular edema, left eye: Secondary | ICD-10-CM | POA: Diagnosis not present

## 2015-12-28 ENCOUNTER — Encounter: Payer: Self-pay | Admitting: Hematology and Oncology

## 2015-12-28 ENCOUNTER — Telehealth: Payer: Self-pay | Admitting: Hematology and Oncology

## 2015-12-28 NOTE — Telephone Encounter (Signed)
Pt returned call regarding upcoming appt for 5/8 at 1:30pm, completed in take, verified address and insurance, mailed new packet, in basket referring provider

## 2016-01-03 ENCOUNTER — Other Ambulatory Visit: Payer: Self-pay | Admitting: Internal Medicine

## 2016-01-03 DIAGNOSIS — I639 Cerebral infarction, unspecified: Secondary | ICD-10-CM

## 2016-01-03 DIAGNOSIS — R42 Dizziness and giddiness: Secondary | ICD-10-CM

## 2016-01-03 DIAGNOSIS — I4891 Unspecified atrial fibrillation: Secondary | ICD-10-CM

## 2016-01-07 DIAGNOSIS — C44619 Basal cell carcinoma of skin of left upper limb, including shoulder: Secondary | ICD-10-CM | POA: Diagnosis not present

## 2016-01-07 DIAGNOSIS — D485 Neoplasm of uncertain behavior of skin: Secondary | ICD-10-CM | POA: Diagnosis not present

## 2016-01-07 DIAGNOSIS — Z85820 Personal history of malignant melanoma of skin: Secondary | ICD-10-CM | POA: Diagnosis not present

## 2016-01-07 DIAGNOSIS — C44519 Basal cell carcinoma of skin of other part of trunk: Secondary | ICD-10-CM | POA: Diagnosis not present

## 2016-01-07 DIAGNOSIS — C44319 Basal cell carcinoma of skin of other parts of face: Secondary | ICD-10-CM | POA: Diagnosis not present

## 2016-01-07 DIAGNOSIS — L821 Other seborrheic keratosis: Secondary | ICD-10-CM | POA: Diagnosis not present

## 2016-01-07 DIAGNOSIS — D1801 Hemangioma of skin and subcutaneous tissue: Secondary | ICD-10-CM | POA: Diagnosis not present

## 2016-01-07 DIAGNOSIS — L570 Actinic keratosis: Secondary | ICD-10-CM | POA: Diagnosis not present

## 2016-01-07 DIAGNOSIS — Z85828 Personal history of other malignant neoplasm of skin: Secondary | ICD-10-CM | POA: Diagnosis not present

## 2016-01-14 ENCOUNTER — Encounter: Payer: Self-pay | Admitting: Hematology and Oncology

## 2016-01-14 ENCOUNTER — Ambulatory Visit (HOSPITAL_BASED_OUTPATIENT_CLINIC_OR_DEPARTMENT_OTHER): Payer: Medicare Other | Admitting: Hematology and Oncology

## 2016-01-14 VITALS — BP 140/68 | HR 87 | Temp 98.0°F | Resp 18 | Ht 72.0 in | Wt 186.8 lb

## 2016-01-14 DIAGNOSIS — I10 Essential (primary) hypertension: Secondary | ICD-10-CM | POA: Insufficient documentation

## 2016-01-14 DIAGNOSIS — I639 Cerebral infarction, unspecified: Secondary | ICD-10-CM

## 2016-01-14 DIAGNOSIS — Q2112 Patent foramen ovale: Secondary | ICD-10-CM

## 2016-01-14 DIAGNOSIS — E11319 Type 2 diabetes mellitus with unspecified diabetic retinopathy without macular edema: Secondary | ICD-10-CM | POA: Diagnosis not present

## 2016-01-14 DIAGNOSIS — I63433 Cerebral infarction due to embolism of bilateral posterior cerebral arteries: Secondary | ICD-10-CM

## 2016-01-14 DIAGNOSIS — Z8673 Personal history of transient ischemic attack (TIA), and cerebral infarction without residual deficits: Secondary | ICD-10-CM | POA: Insufficient documentation

## 2016-01-14 DIAGNOSIS — Z794 Long term (current) use of insulin: Secondary | ICD-10-CM

## 2016-01-14 DIAGNOSIS — Q211 Atrial septal defect: Secondary | ICD-10-CM

## 2016-01-14 DIAGNOSIS — Z85828 Personal history of other malignant neoplasm of skin: Secondary | ICD-10-CM

## 2016-01-14 DIAGNOSIS — E113593 Type 2 diabetes mellitus with proliferative diabetic retinopathy without macular edema, bilateral: Secondary | ICD-10-CM

## 2016-01-14 HISTORY — DX: Cerebral infarction, unspecified: I63.9

## 2016-01-14 MED ORDER — APIXABAN 5 MG PO TABS: 5.0000 mg | ORAL_TABLET | Freq: Two times a day (BID) | ORAL | Status: DC

## 2016-01-14 NOTE — Assessment & Plan Note (Signed)
The patient has poorly controlled diabetes. We discussed aggressive risk factor modification with lifestyle modification, exercise and dietary modification. He will need to follow closely with his primary care doctor for diabetic management.

## 2016-01-14 NOTE — Assessment & Plan Note (Signed)
The patient has history of skin cancer. I advocate importance of skin protection, avoidance of excessive sun exposure, using sunscreen and close follow-up with dermatologist. I also recommended vitamin D supplement

## 2016-01-14 NOTE — Assessment & Plan Note (Signed)
The patient was found to have patent foreman ovale but was considered low risk for shunt. He does not need any surgical management now.

## 2016-01-14 NOTE — Assessment & Plan Note (Signed)
I have a long discussion with the patient and family members. His outside MRI report indicated bilateral cerebral involvement, suggestive of either small vessel disease versus chronic embolic stroke. Transesophageal echocardiogram from November 2016 also found patent foreman ovale, although considered low risk without evidence of shunt It is unclear whether the patient may have paroxysmal atrial fibrillation. Cardiology workup is pending. The patient continues to have recurrence of symptoms despite being on dual antiplatelet agents. His recurrent stroke risk is very high. Thrombophilia workup specifically for prothrombin gene mutation was negative. Overall, I do not feel it is strongly necessary to repeat thrombophilia panel. We discussed the risk, benefits, side effects of starting him on new oral anticoagulant agent such Eliquis or Xarelto. Ultimately, they're in agreement to proceed. I will start him Eliquis 5 mg twice a day along with 81 mg aspirin daily. He can discontinue Plavix while on the combination treatment. We discussed the importance of aggressive risk factor modifications. While on Eliquid, the patient would need CBC monitoring twice a year to monitor bleeding and renal function monitoring to ensure that he has adequate creatinine clearance of at least 35 ml/min to remain on Eliquis. I have not made return appointment for the patient to come back and I have addressed all their questions and concerns.

## 2016-01-14 NOTE — Assessment & Plan Note (Signed)
His blood pressure fluctuated widely. I would defer to his primary care for medical management.

## 2016-01-14 NOTE — Progress Notes (Signed)
[facility] CONSULT NOTE  Patient Care Team: [name], MD as PCP - General (Internal Medicine)  CHIEF COMPLAINTS/PURPOSE OF CONSULTATION:  Recurrent stroke, history of skin cancer  HISTORY OF PRESENTING ILLNESS:  James Blevins 70 y.o. male is here because of recurrent stroke. This patient has cardiovascular risk factors including poorly controlled diabetes, diabetic retinopathy, probable hypertension and strong family history of cardiovascular disease. I reviewed his outside records extensively and collaborated the history with the patient and his daughter. He is here today with his wife, his grandson and his daughter who is an excellent historian. According to the patient, he was admitted to the hospital last year after presentation with symptoms of visual disturbance over the left eye, altered mental status, slurring of speech and was admitted to an outside facility on 08/03/2015. At the time, the patient was noted to have bradycardia and significant hypertension He underwent extensive evaluation. I reviewed the outside records including imaging study. The patient also has seizure-like activity at that time. He had MRI angiogram, echocardiogram, transesophageal echocardiogram and EEG as well as multiple consultation. MRI brain dated 08/04/2015 showed multiple small areas of abnormal restricted perfusion involving both cerebral hemisphere and the left cerebellum. There are also probable involvement of the right frontal lobe. CT scan without contrast shows small cortical infarction on the lateral right occipital lobe. MRI angiogram showed no significant stenosis in carotid or vertebral arteries. Echocardiogram show preserved ejection fraction, mild sclerotic aortic valve without significant valvular heart disease. Transesophageal echocardiogram show patent foramen ovale but no high risk anatomy seen. Moderate arteriosclerosis is noted on the descending aorta. The patient  was subsequently discharged home with dual antiplatelet agents. Hemoglobin A1c was at 7.9%. He was started on Lipitor. The patient had no permanent neurological sequelae.  On 12/01/2015, he had recurrence of some of the symptoms including a sensation of dizziness, visual changes and facial drooping that lasted 10 minutes. He has recurrence of symptoms again on 12/03/2015 and he was subsequently admitted to the hospital for further evaluation. CT scan of the head show no significant evidence of stroke. CT angiogram show some stenosis in the basilar artery but not critical. MRI of the brain was unremarkable. MRI angiogram show moderate stenosis in the right supraclinoid ICA but not greater than 50%. Mid basilar stenosis is noted at 50-60%. Echocardiogram again is unremarkable. He was discharged home with medical management.  The patient is not vigilant monitoring his blood sugar. His blood sugar this morning is over 150. Recent repeat hemoglobin A1c was 8%. Blood pressure today is high. His blood pressure has been as high, as systolic blood pressure [id number]  The patient has history of skin cancer and follows closely with dermatologist. He is up-to-date with other screening programs.  His daughter had DVT while on birth control pill, subsequently found to have prothrombin gene mutation. The patient has coagulation study performed recently and prothrombin gene mutation study was negative. He had strong family history of cardiovascular disease in his mother and grandfather. He never smoked.  The patient has diabetic retinopathy and cataract surgery requiring some injection of medication into his eye. He is subsequently referred here to address the question whether he has thrombophilia disorder that would predispose to recurrence of stroke  MEDICAL HISTORY:  Past Medical History  Diagnosis Date  . Diabetes ([place])   . Skin cancer   . Cataracts, bilateral   . Retinal detachment left eye, 08/2013  . Stroke  ([place])   . Stroke due  to embolism ([place]) 01/14/2016    SURGICAL HISTORY: Past Surgical History  Procedure Laterality Date  . Tonsillectomy  childhood  . Colonoscopy  2002  . Skin cancer removal    . Eye surgery      SOCIAL HISTORY: Social History   Social History  . Marital Status: Married    Spouse Name: N/A  . Number of Children: N/A  . Years of Education: N/A   Occupational History  . Not on file.   Social History Main Topics  . Smoking status: Never Smoker   . Smokeless tobacco: Never Used  . Alcohol Use: Yes     Comment: rare  . Drug Use: No  . Sexual Activity: Not on file   Other Topics Concern  . Not on file   Social History Narrative    FAMILY HISTORY: Family History  Problem Relation Age of Onset  . Colon cancer Neg Hx   . Esophageal cancer Neg Hx   . Rectal cancer Neg Hx   . Stomach cancer Neg Hx   . Clotting disorder Mother     heart attack  . Stroke Maternal Grandfather     stroke    ALLERGIES:  has No Known Allergies.  MEDICATIONS:  Current Outpatient Prescriptions  Medication Sig Dispense Refill  . aspirin 81 MG tablet Take 81 mg by mouth daily.    [name] atorvastatin (LIPITOR) 80 MG tablet Take 80 mg by mouth daily.    . clopidogrel (PLAVIX) 75 MG tablet Take 75 mg by mouth daily.    . sitaGLIPtin-metformin (JANUMET) 50-500 MG per tablet Take 1 tablet by mouth 2 (two) times daily with a meal.    . apixaban (ELIQUIS) 5 MG TABS tablet Take 1 tablet (5 mg total) by mouth 2 (two) times daily. 60 tablet 11   No current facility-administered medications for this visit.    REVIEW OF SYSTEMS:   Constitutional: Denies fevers, chills or abnormal night sweats Eyes: Denies blurriness of vision, double vision or watery eyes Ears, nose, mouth, throat, and face: Denies mucositis or sore throat Respiratory: Denies cough, dyspnea or wheezes Cardiovascular: Denies palpitation, chest discomfort or lower extremity swelling Gastrointestinal:  Denies nausea,  heartburn or change in bowel habits Skin: Denies abnormal skin rashes Lymphatics: Denies new lymphadenopathy or easy bruising Neurological:Denies numbness, tingling or new weaknesses Behavioral/Psych: Mood is stable, no new changes  All other systems were reviewed with the patient and are negative.  PHYSICAL EXAMINATION: ECOG PERFORMANCE STATUS: 0 - Asymptomatic  Filed Vitals:   01/14/16 1331  BP: 140/68  Pulse: 87  Temp: 98 F (36.7 C)  Resp: 18   Filed Weights   01/14/16 1331  Weight: 186 lb 12.8 oz (84.732 kg)    GENERAL:alert, no distress and comfortable SKIN: skin color, texture, turgor are normal, no rashes or significant lesions. Noted solar keratosis EYES: normal, conjunctiva are pink and non-injected, sclera clear OROPHARYNX:no exudate, no erythema and lips, buccal mucosa, and tongue normal  NECK: supple, thyroid normal size, non-tender, without nodularity LYMPH:  no palpable lymphadenopathy in the cervical, axillary or inguinal LUNGS: clear to auscultation and percussion with normal breathing effort HEART: regular rate & rhythm and no murmurs and no lower extremity edema ABDOMEN:abdomen soft, non-tender and normal bowel sounds Musculoskeletal:no cyanosis of digits and no clubbing  PSYCH: alert & oriented x 3 with fluent speech NEURO: no focal motor/sensory deficits  LABORATORY DATA:  I have reviewed the data as listed in electronic records ASSESSMENT & PLAN:  Stroke  due to embolism [facility]) I have a long discussion with the patient and family members. His outside MRI report indicated bilateral cerebral involvement, suggestive of either small vessel disease versus chronic embolic stroke. Transesophageal echocardiogram from November 2016 also found patent foreman ovale, although considered low risk without evidence of shunt It is unclear whether the patient may have paroxysmal atrial fibrillation. Cardiology workup is pending. The patient continues to have recurrence  of symptoms despite being on dual antiplatelet agents. His recurrent stroke risk is very high. Thrombophilia workup specifically for prothrombin gene mutation was negative. Overall, I do not feel it is strongly necessary to repeat thrombophilia panel. We discussed the risk, benefits, side effects of starting him on new oral anticoagulant agent such Eliquis or Xarelto. Ultimately, they're in agreement to proceed. I will start him Eliquis 5 mg twice a day along with 81 mg aspirin daily. He can discontinue Plavix while on the combination treatment. We discussed the importance of aggressive risk factor modifications. While on Eliquid, the patient would need CBC monitoring twice a year to monitor bleeding and renal function monitoring to ensure that he has adequate creatinine clearance of at least 35 ml/min to remain on Eliquis. I have not made return appointment for the patient to come back and I have addressed all their questions and concerns.  Type 2 diabetes mellitus with retinopathy of both eyes, without long-term current use of insulin ([place]) The patient has poorly controlled diabetes. We discussed aggressive risk factor modification with lifestyle modification, exercise and dietary modification. He will need to follow closely with his primary care doctor for diabetic management.  Essential hypertension His blood pressure fluctuated widely. I would defer to his primary care for medical management.  Patent foramen ovale The patient was found to have patent foreman ovale but was considered low risk for shunt. He does not need any surgical management now.  History of skin cancer The patient has history of skin cancer. I advocate importance of skin protection, avoidance of excessive sun exposure, using sunscreen and close follow-up with dermatologist. I also recommended vitamin D supplement     All questions were answered. The patient knows to call the clinic with any problems, questions or  concerns. I spent 55 minutes counseling the patient face to face. The total time spent in the appointment was 60 minutes and more than 50% was on counseling.     [facility], [place], MD 01/14/2016 3:52 PM

## 2016-01-15 ENCOUNTER — Ambulatory Visit (INDEPENDENT_AMBULATORY_CARE_PROVIDER_SITE_OTHER): Payer: Medicare Other

## 2016-01-15 DIAGNOSIS — I4891 Unspecified atrial fibrillation: Secondary | ICD-10-CM | POA: Diagnosis not present

## 2016-01-15 DIAGNOSIS — R42 Dizziness and giddiness: Secondary | ICD-10-CM

## 2016-01-15 DIAGNOSIS — I639 Cerebral infarction, unspecified: Secondary | ICD-10-CM | POA: Diagnosis not present

## 2016-01-29 DIAGNOSIS — Z85820 Personal history of malignant melanoma of skin: Secondary | ICD-10-CM | POA: Diagnosis not present

## 2016-01-29 DIAGNOSIS — L821 Other seborrheic keratosis: Secondary | ICD-10-CM | POA: Diagnosis not present

## 2016-01-29 DIAGNOSIS — Z85828 Personal history of other malignant neoplasm of skin: Secondary | ICD-10-CM | POA: Diagnosis not present

## 2016-01-29 DIAGNOSIS — C44319 Basal cell carcinoma of skin of other parts of face: Secondary | ICD-10-CM | POA: Diagnosis not present

## 2016-03-20 DIAGNOSIS — H26491 Other secondary cataract, right eye: Secondary | ICD-10-CM | POA: Diagnosis not present

## 2016-03-20 DIAGNOSIS — E113411 Type 2 diabetes mellitus with severe nonproliferative diabetic retinopathy with macular edema, right eye: Secondary | ICD-10-CM | POA: Diagnosis not present

## 2016-03-20 DIAGNOSIS — E113412 Type 2 diabetes mellitus with severe nonproliferative diabetic retinopathy with macular edema, left eye: Secondary | ICD-10-CM | POA: Diagnosis not present

## 2016-03-20 DIAGNOSIS — H26492 Other secondary cataract, left eye: Secondary | ICD-10-CM | POA: Diagnosis not present

## 2016-03-26 ENCOUNTER — Other Ambulatory Visit: Payer: Self-pay | Admitting: Internal Medicine

## 2016-03-26 ENCOUNTER — Ambulatory Visit
Admission: RE | Admit: 2016-03-26 | Discharge: 2016-03-26 | Disposition: A | Discharge status: Home / Self Care | Payer: Medicare Other | Source: Ambulatory Visit | Attending: Internal Medicine | Admitting: Internal Medicine

## 2016-03-26 ENCOUNTER — Encounter (HOSPITAL_COMMUNITY): Payer: Self-pay | Admitting: *Deleted

## 2016-03-26 ENCOUNTER — Observation Stay (HOSPITAL_COMMUNITY)
Admission: EM | Admit: 2016-03-26 | Discharge: 2016-03-27 | Disposition: A | Discharge status: Home / Self Care | Payer: Medicare Other | Attending: Internal Medicine | Admitting: Internal Medicine

## 2016-03-26 DIAGNOSIS — I951 Orthostatic hypotension: Secondary | ICD-10-CM | POA: Diagnosis not present

## 2016-03-26 DIAGNOSIS — Z8673 Personal history of transient ischemic attack (TIA), and cerebral infarction without residual deficits: Secondary | ICD-10-CM | POA: Diagnosis not present

## 2016-03-26 DIAGNOSIS — E785 Hyperlipidemia, unspecified: Secondary | ICD-10-CM | POA: Diagnosis not present

## 2016-03-26 DIAGNOSIS — Z7901 Long term (current) use of anticoagulants: Secondary | ICD-10-CM | POA: Diagnosis not present

## 2016-03-26 DIAGNOSIS — R262 Difficulty in walking, not elsewhere classified: Secondary | ICD-10-CM | POA: Insufficient documentation

## 2016-03-26 DIAGNOSIS — Q211 Atrial septal defect: Secondary | ICD-10-CM | POA: Diagnosis not present

## 2016-03-26 DIAGNOSIS — I639 Cerebral infarction, unspecified: Secondary | ICD-10-CM | POA: Diagnosis not present

## 2016-03-26 DIAGNOSIS — R2681 Unsteadiness on feet: Secondary | ICD-10-CM | POA: Diagnosis not present

## 2016-03-26 DIAGNOSIS — R4182 Altered mental status, unspecified: Secondary | ICD-10-CM | POA: Diagnosis not present

## 2016-03-26 DIAGNOSIS — R269 Unspecified abnormalities of gait and mobility: Secondary | ICD-10-CM | POA: Diagnosis not present

## 2016-03-26 DIAGNOSIS — H3530 Unspecified macular degeneration: Secondary | ICD-10-CM | POA: Insufficient documentation

## 2016-03-26 DIAGNOSIS — R404 Transient alteration of awareness: Secondary | ICD-10-CM | POA: Diagnosis not present

## 2016-03-26 DIAGNOSIS — N179 Acute kidney failure, unspecified: Secondary | ICD-10-CM | POA: Insufficient documentation

## 2016-03-26 DIAGNOSIS — Z7982 Long term (current) use of aspirin: Secondary | ICD-10-CM | POA: Insufficient documentation

## 2016-03-26 DIAGNOSIS — R112 Nausea with vomiting, unspecified: Secondary | ICD-10-CM | POA: Diagnosis not present

## 2016-03-26 DIAGNOSIS — Q2112 Patent foramen ovale: Secondary | ICD-10-CM

## 2016-03-26 DIAGNOSIS — G451 Carotid artery syndrome (hemispheric): Secondary | ICD-10-CM | POA: Diagnosis not present

## 2016-03-26 DIAGNOSIS — I959 Hypotension, unspecified: Secondary | ICD-10-CM | POA: Insufficient documentation

## 2016-03-26 DIAGNOSIS — E113593 Type 2 diabetes mellitus with proliferative diabetic retinopathy without macular edema, bilateral: Secondary | ICD-10-CM

## 2016-03-26 DIAGNOSIS — H3500 Unspecified background retinopathy: Secondary | ICD-10-CM | POA: Diagnosis not present

## 2016-03-26 DIAGNOSIS — E1165 Type 2 diabetes mellitus with hyperglycemia: Secondary | ICD-10-CM | POA: Diagnosis not present

## 2016-03-26 DIAGNOSIS — I651 Occlusion and stenosis of basilar artery: Secondary | ICD-10-CM | POA: Insufficient documentation

## 2016-03-26 DIAGNOSIS — E860 Dehydration: Secondary | ICD-10-CM | POA: Insufficient documentation

## 2016-03-26 DIAGNOSIS — R55 Syncope and collapse: Secondary | ICD-10-CM | POA: Diagnosis not present

## 2016-03-26 DIAGNOSIS — R42 Dizziness and giddiness: Secondary | ICD-10-CM | POA: Diagnosis not present

## 2016-03-26 DIAGNOSIS — E1142 Type 2 diabetes mellitus with diabetic polyneuropathy: Secondary | ICD-10-CM | POA: Diagnosis not present

## 2016-03-26 DIAGNOSIS — E11319 Type 2 diabetes mellitus with unspecified diabetic retinopathy without macular edema: Secondary | ICD-10-CM | POA: Diagnosis not present

## 2016-03-26 DIAGNOSIS — Z6824 Body mass index (BMI) 24.0-24.9, adult: Secondary | ICD-10-CM | POA: Diagnosis not present

## 2016-03-26 DIAGNOSIS — Z7902 Long term (current) use of antithrombotics/antiplatelets: Secondary | ICD-10-CM | POA: Insufficient documentation

## 2016-03-26 DIAGNOSIS — Z85828 Personal history of other malignant neoplasm of skin: Secondary | ICD-10-CM | POA: Insufficient documentation

## 2016-03-26 DIAGNOSIS — Z7984 Long term (current) use of oral hypoglycemic drugs: Secondary | ICD-10-CM | POA: Diagnosis not present

## 2016-03-26 DIAGNOSIS — Z79899 Other long term (current) drug therapy: Secondary | ICD-10-CM | POA: Diagnosis not present

## 2016-03-26 LAB — COMPREHENSIVE METABOLIC PANEL
ALK PHOS: 93 U/L (ref 38–126)
ALT: 26 U/L (ref 17–63)
ANION GAP: 10 (ref 5–15)
AST: 23 U/L (ref 15–41)
Albumin: 4.2 g/dL (ref 3.5–5.0)
BUN: 21 mg/dL — ABNORMAL HIGH (ref 6–20)
CALCIUM: 9.3 mg/dL (ref 8.9–10.3)
CO2: 24 mmol/L (ref 22–32)
CREATININE: 1.39 mg/dL — AB (ref 0.61–1.24)
Chloride: 103 mmol/L (ref 101–111)
GFR, EST AFRICAN AMERICAN: 57 mL/min — AB (ref >60)
GFR, EST NON AFRICAN AMERICAN: 49 mL/min — AB (ref >60)
Glucose, Bld: 199 mg/dL — ABNORMAL HIGH (ref 65–99)
Potassium: 4.1 mmol/L (ref 3.5–5.1)
SODIUM: 137 mmol/L (ref 135–145)
TOTAL PROTEIN: 6.6 g/dL (ref 6.5–8.1)
Total Bilirubin: 1.2 mg/dL (ref 0.3–1.2)

## 2016-03-26 LAB — CBC
HCT: 46 % (ref 39.0–52.0)
HEMOGLOBIN: 15.9 g/dL (ref 13.0–17.0)
MCH: 30.9 pg (ref 26.0–34.0)
MCHC: 34.6 g/dL (ref 30.0–36.0)
MCV: 89.5 fL (ref 78.0–100.0)
PLATELETS: 187 10*3/uL (ref 150–400)
RBC: 5.14 MIL/uL (ref 4.22–5.81)
RDW: 12.7 % (ref 11.5–15.5)
WBC: 9.7 10*3/uL (ref 4.0–10.5)

## 2016-03-26 LAB — GLUCOSE, CAPILLARY: GLUCOSE-CAPILLARY: 228 mg/dL — AB (ref 65–99)

## 2016-03-26 LAB — TROPONIN I: Troponin I: <0.03 ng/mL (ref <0.03)

## 2016-03-26 LAB — CBG MONITORING, ED: GLUCOSE-CAPILLARY: 193 mg/dL — AB (ref 65–99)

## 2016-03-26 LAB — LIPASE, BLOOD: LIPASE: 27 U/L (ref 11–51)

## 2016-03-26 MED ORDER — ENOXAPARIN SODIUM 40 MG/0.4ML ~~LOC~~ SOLN
40.0000 mg | SUBCUTANEOUS | Status: DC
  Administered 2016-03-26: 40 mg via SUBCUTANEOUS
  Filled 2016-03-26: qty 0.4

## 2016-03-26 MED ORDER — ONDANSETRON 4 MG PO TBDP
ORAL_TABLET | ORAL | Status: AC
  Filled 2016-03-26: qty 1

## 2016-03-26 MED ORDER — ASPIRIN EC 81 MG PO TBEC
81.0000 mg | DELAYED_RELEASE_TABLET | Freq: Every day | ORAL | Status: DC
  Filled 2016-03-26: qty 1

## 2016-03-26 MED ORDER — ONDANSETRON 4 MG PO TBDP
4.0000 mg | ORAL_TABLET | Freq: Once | ORAL | Status: AC | PRN
  Administered 2016-03-26: 4 mg via ORAL

## 2016-03-26 MED ORDER — SODIUM CHLORIDE 0.9 % IV BOLUS (SEPSIS)
1000.0000 mL | INTRAVENOUS | Status: AC
  Administered 2016-03-26: 1000 mL via INTRAVENOUS

## 2016-03-26 MED ORDER — IBUPROFEN 200 MG PO TABS: 400.0000 mg | ORAL_TABLET | Freq: Four times a day (QID) | ORAL | Status: DC | PRN

## 2016-03-26 MED ORDER — ONDANSETRON HCL 4 MG PO TABS: 4.0000 mg | ORAL_TABLET | Freq: Four times a day (QID) | ORAL | Status: DC | PRN

## 2016-03-26 MED ORDER — PANTOPRAZOLE SODIUM 40 MG PO TBEC
40.0000 mg | DELAYED_RELEASE_TABLET | Freq: Every day | ORAL | Status: DC
  Filled 2016-03-26: qty 1

## 2016-03-26 MED ORDER — ATORVASTATIN CALCIUM 80 MG PO TABS
80.0000 mg | ORAL_TABLET | Freq: Every day | ORAL | Status: DC
  Filled 2016-03-26: qty 1

## 2016-03-26 MED ORDER — CLOPIDOGREL BISULFATE 75 MG PO TABS
75.0000 mg | ORAL_TABLET | Freq: Every day | ORAL | Status: DC
  Administered 2016-03-27: 75 mg via ORAL
  Filled 2016-03-26: qty 1

## 2016-03-26 MED ORDER — SODIUM CHLORIDE 0.9 % IV SOLN
INTRAVENOUS | Status: DC
  Administered 2016-03-26: 22:00:00 via INTRAVENOUS

## 2016-03-26 MED ORDER — INSULIN ASPART 100 UNIT/ML ~~LOC~~ SOLN
0.0000 [IU] | Freq: Three times a day (TID) | SUBCUTANEOUS | Status: DC
  Administered 2016-03-27: 3 [IU] via SUBCUTANEOUS
  Administered 2016-03-27: 1 [IU] via SUBCUTANEOUS

## 2016-03-26 MED ORDER — SODIUM CHLORIDE 0.9% FLUSH
3.0000 mL | Freq: Two times a day (BID) | INTRAVENOUS | Status: DC
  Administered 2016-03-26 – 2016-03-27 (×2): 3 mL via INTRAVENOUS

## 2016-03-26 MED ORDER — ALBUTEROL SULFATE (2.5 MG/3ML) 0.083% IN NEBU: 2.5000 mg | INHALATION_SOLUTION | RESPIRATORY_TRACT | Status: DC | PRN

## 2016-03-26 MED ORDER — ONDANSETRON HCL 4 MG/2ML IJ SOLN
4.0000 mg | Freq: Four times a day (QID) | INTRAMUSCULAR | Status: DC | PRN
Start: 2016-03-26 — End: 2016-03-27

## 2016-03-26 NOTE — ED Notes (Signed)
Per EMS: pt coming from [facility] for head CT which was negative. While there pt had two episode of n/v and near syncope. Pt was orthostatic, no fluids given. Whenever pt changes position pt has n/v. Pt denies pain

## 2016-03-26 NOTE — ED Provider Notes (Signed)
CSN: [id number]     Arrival date & time 03/26/16  1804 History   First MD Initiated Contact with Patient 03/26/16 1843     Chief Complaint  Patient presents with  . Altered Mental Status  . Nausea  . Emesis     (Consider location/radiation/quality/duration/timing/severity/associated sxs/prior Treatment) HPI Patient has had unsteady gait onset 3 days ago. Today he was sent for outpatient CT scan by ordered his primary care physician Dr. Jarold Song at [facility]. At 4 PM today he suffered near syncopal event which was preceded by lightheadedness. He still feels somewhat unsteady. No treatment prior to coming here he also vomited one time today although he is no longer nauseated.  makes symptoms better or worse. Past Medical History  Diagnosis Date  . Diabetes ([place])   . Skin cancer   . Cataracts, bilateral   . Retinal detachment left eye, 08/2013  . Stroke ([place])   . Stroke due to embolism ([place]) 01/14/2016   Past Surgical History  Procedure Laterality Date  . Tonsillectomy  childhood  . Colonoscopy  2002  . Skin cancer removal    . Eye surgery     Family History  Problem Relation Age of Onset  . Colon cancer Neg Hx   . Esophageal cancer Neg Hx   . Rectal cancer Neg Hx   . Stomach cancer Neg Hx   . Clotting disorder Mother     heart attack  . Stroke Maternal Grandfather     stroke   Social History  Substance Use Topics  . Smoking status: Never Smoker   . Smokeless tobacco: Never Used  . Alcohol Use: Yes     Comment: rare    Review of Systems  Musculoskeletal: Positive for gait problem.       Unsteady gait  Allergic/Immunologic: Positive for immunocompromised state.       Diabetic  All other systems reviewed and are negative.     Allergies  Review of patient's allergies indicates no known allergies.  Home Medications   Prior to Admission medications   Medication Sig Start Date End Date Taking? Authorizing Provider  apixaban (ELIQUIS) 5 MG TABS  tablet Take 1 tablet (5 mg total) by mouth 2 (two) times daily. 01/14/16   Heath Lark, MD  aspirin 81 MG tablet Take 81 mg by mouth daily.    Historical Provider, MD  atorvastatin (LIPITOR) 80 MG tablet Take 80 mg by mouth daily.    Historical Provider, MD  clopidogrel (PLAVIX) 75 MG tablet Take 75 mg by mouth daily.    Historical Provider, MD  sitaGLIPtin-metformin (JANUMET) 50-500 MG per tablet Take 1 tablet by mouth 2 (two) times daily with a meal.    Historical Provider, MD   BP 140/91 mmHg  Pulse 78  Temp(Src) 98.4 F (36.9 C) (Oral)  Resp 14  SpO2 99% Physical Exam  Constitutional: He is oriented to person, place, and time. He appears well-developed and well-nourished.  HENT:  Head: Normocephalic and atraumatic.  Eyes: Conjunctivae are normal. Pupils are equal, round, and reactive to light.  Neck: Neck supple. No tracheal deviation present. No thyromegaly present.  Cardiovascular: Normal rate and regular rhythm.   No murmur heard. Pulmonary/Chest: Effort normal and breath sounds normal.  Abdominal: Soft. Bowel sounds are normal. He exhibits no distension. There is no tenderness.  Musculoskeletal: Normal range of motion. He exhibits no edema or tenderness.  Neurological: He is alert and oriented to person, place, and time. He has normal reflexes.  Coordination normal.  Gait normal pronator drift normal. Finger to nose normal  Skin: Skin is warm and dry. No rash noted.  Psychiatric: He has a normal mood and affect.  Nursing note and vitals reviewed.   ED Course  Procedures (including critical care time) Labs Review Labs Reviewed  CBG MONITORING, ED - Abnormal; Notable for the following:    Glucose-Capillary 193 (*)    All other components within normal limits  CBC  COMPREHENSIVE METABOLIC PANEL  LIPASE, BLOOD  URINALYSIS, ROUTINE W REFLEX MICROSCOPIC (NOT AT [facility])    Imaging Review Ct Head Wo Contrast  03/26/2016  CLINICAL DATA:  Dizziness with unsteady gait.  History of  strokes. EXAM: CT HEAD WITHOUT CONTRAST TECHNIQUE: Contiguous axial images were obtained from the base of the skull through the vertex without intravenous contrast. COMPARISON:  None. FINDINGS: There is no evidence for acute hemorrhage, hydrocephalus, mass lesion, or abnormal extra-axial fluid collection. No definite CT evidence for acute infarction. Small focus of encephalomalacia the posterior right parietal lobe is compatible with old infarct. Diffuse loss of parenchymal volume is consistent with atrophy. The visualized paranasal sinuses and mastoid air cells are clear. IMPRESSION: 1. No acute intracranial abnormality. 2. Atrophy with chronic small vessel white matter ischemic demyelination. Electronically Signed   By: Misty Stanley M.D.   On: 03/26/2016 16:59   I have personally reviewed and evaluated these images and lab results as part of my medical decision-making.   EKG Interpretation   Date/Time:  Wednesday March 26 2016 18:12:54 EDT Ventricular Rate:  63 PR Interval:  128 QRS Duration: 86 QT Interval:  416 QTC Calculation: 425 R Axis:   77 Text Interpretation:  Normal sinus rhythm Nonspecific ST abnormality  Abnormal ECG No significant change since last tracing Confirmed by  Winfred Leeds  MD, Natalin Bible [phone]) on 03/26/2016 7:05:24 PM     Patient found to be orthostatic. With blood pressure going from 140s to 110's from lying supine to standing.] Results for orders placed or performed during the hospital encounter of 03/26/16  Comprehensive metabolic panel  Result Value Ref Range   Sodium 137 135 - 145 mmol/L   Potassium 4.1 3.5 - 5.1 mmol/L   Chloride 103 101 - 111 mmol/L   CO2 24 22 - 32 mmol/L   Glucose, Bld 199 (H) 65 - 99 mg/dL   BUN 21 (H) 6 - 20 mg/dL   Creatinine, Ser 1.39 (H) 0.61 - 1.24 mg/dL   Calcium 9.3 8.9 - 10.3 mg/dL   Total Protein 6.6 6.5 - 8.1 g/dL   Albumin 4.2 3.5 - 5.0 g/dL   AST 23 15 - 41 U/L   ALT 26 17 - 63 U/L   Alkaline Phosphatase 93 38 - 126 U/L    Total Bilirubin 1.2 0.3 - 1.2 mg/dL   GFR calc non Af Amer 49 (L) >60 mL/min   GFR calc Af Amer 57 (L) >60 mL/min   Anion gap 10 5 - 15  CBC  Result Value Ref Range   WBC 9.7 4.0 - 10.5 K/uL   RBC 5.14 4.22 - 5.81 MIL/uL   Hemoglobin 15.9 13.0 - 17.0 g/dL   HCT 46.0 39.0 - 52.0 %   MCV 89.5 78.0 - 100.0 fL   MCH 30.9 26.0 - 34.0 pg   MCHC 34.6 30.0 - 36.0 g/dL   RDW 12.7 11.5 - 15.5 %   Platelets 187 150 - 400 K/uL  Lipase, blood  Result Value Ref Range   Lipase 27  11 - 51 U/L  CBG monitoring, ED  Result Value Ref Range   Glucose-Capillary 193 (H) 65 - 99 mg/dL   Ct Head Wo Contrast  03/26/2016  CLINICAL DATA:  Dizziness with unsteady gait.  History of strokes. EXAM: CT HEAD WITHOUT CONTRAST TECHNIQUE: Contiguous axial images were obtained from the base of the skull through the vertex without intravenous contrast. COMPARISON:  None. FINDINGS: There is no evidence for acute hemorrhage, hydrocephalus, mass lesion, or abnormal extra-axial fluid collection. No definite CT evidence for acute infarction. Small focus of encephalomalacia the posterior right parietal lobe is compatible with old infarct. Diffuse loss of parenchymal volume is consistent with atrophy. The visualized paranasal sinuses and mastoid air cells are clear. IMPRESSION: 1. No acute intracranial abnormality. 2. Atrophy with chronic small vessel white matter ischemic demyelination. Electronically Signed   By: Misty Stanley M.D.   On: 03/26/2016 16:59    MDM  Lightheadedness and near syncope consistent with orthostasis. Intravenous fluids ordered. I consulted Dr. Tamala Julian who arrange for 23 hour observation for near syncope, vomiting and gait. I've also consulted neurology who will see patient in hospital to make further recommendations. Final diagnoses:  None   Diagnosis #1 near syncope #2 nausea and vomiting #3 orthostasis #4 gait abnormality #5 renal insufficiency     Orlie Dakin, MD 03/26/16 2044

## 2016-03-26 NOTE — Consult Note (Signed)
Neurology Consult Note  Reason for Consultation: recommendations for testing/management of known basilar artery stenosis  Requesting provider: Orlie Dakin, MD  CC: dizziness  HPI: This is a 70 year old right-handed man presented to the emergency department from an outside radiology center following a near syncopal event. History is obtained directly from the patient who is an excellent historian. His daughter is present at the bedside as well and she offers corroboration and additional information as necessary.  The patient has a history of spells of various semiologies. The unifying feature of the majority of the spells was a sense of unsteadiness. He describes feeling off balance and as if he would veer to one side. Some of the spells were associated with a sense of almost passing out. He had a couple of episodes where he had facial droop and/or other focal deficits. Full details regarding some of his episodes and prior admissions and workup are detailed below. In brief, he experienced ischemic infarctions in both occipital lobes and the cerebellum in November 2016. Workup at that time revealed stenosis of the midportion of the basilar artery. He has undergone extensive workup for his strokes with no embolic source ever identified. He continued to have episodes of unsteadiness and gait instability over the following months leading to additional admissions and evaluation. However, he states that he has not had any more episodes since April 2017 when he stopped receiving injections for his macular degeneration. He decided to stop these because he was told that they increase the risk of stroke. He feels that since he stopped the injections, he has been doing quite well and his spells have resolved. He has continued taking aspirin and Plavix. He was seen by a hematologist who suggested that he could start Eliquis due to recurrent events with family history of prothrombin gene mutation in his daughter. The  patient's daughter reports that they were given a prescription for Eliquis and were told to think about starting medication. However, they decided not to initiate this and have continued with antiplatelet therapies instead.  He continued to do well until 03/23/16. He reports that on that day, he slept much more than usual and just felt like he had no energy. This continued into 03/24/16. On 7/18, he recalls feeling unsteady while he was driving, noting that he had to concentrate much harder than usual to keep his car in his lane. On 03/26/16, he reports that he went to see his primary care provider and they ordered a CT scan for evaluation of his symptoms. He states it all day long, he was feeling poorly and describes feeling unsteady and somewhat dizzy. Mostly this was appreciated when he was upright and he noticed that if he laid down symptoms resolved. He states that when he was walking into the radiology Center to get his CT scan, he had to stop because he felt dizzy and almost as if he may pass out. He was able to get inside and had to sit down. He broke out into a sweat and continued to feel lightheaded. When he finally laid him down again symptoms improved. He was sent to the emergency department for further evaluation. In the emergency department, he was noted to be orthostatic. Blood pressure lying down was 154/69 but on standing dropped to 119/74. In addition, labs indicated an elevation in his creatinine. He was admitted for observation and rehydration. The patient admits that he has not been eating or drinking as much as he probably should be over the past few  days. He did not experience any focal deficits during his episodes today.  Review of outside records:  Dr. Heath Blevins, hematologist-oncologist, [facility], 01/13/26: He was referred to hematology because of his recurrent stroke and concern for possible hypercoagulable state. His is reported that his daughter had a DVT while taking  birth control pills and was later found to have a prothrombin gene mutation. The patient's prothrombin gene mutation study was negative. Dr. Alvy Blevins discussed the possibility of initiating anticoagulation with the patient. Eliquis was recommended and was to be taken in conjunction with aspirin 81 mg daily with discontinuation of clopidogrel. The note indicates that the patient and his family were agreeable to anticoagulation at the time.  Dr. Ernest Blevins, [facility] Neurology, 12/10/15: Patient reported to this neurologist that he was hospitalized in March 2017 reporting at least 6 episodes where he felt unsteady and lightheaded. He has known stenosis of the basilar artery and workup at the time did not indicate evidence of new infarction. He was evaluated by both interventional radiology and endovascular neurosurgery who felt that medical management was indicated in his case. Documented neurologic examination revealed no deficit. Dr. Owens Blevins recommended continuing aspirin 81 mg daily and Plavix 75 mg as well as Lipitor 80 mg daily. He recommended cardiology referral to evaluate for occult arrhythmia but the patient declined. Dr. Owens Blevins recommended that he maintain a blood pressure around 140/90 to hopefully help mitigate hypoperfusion of his brainstem in the setting of his basilar stenosis.  Review of discharge summary from [facility] in [place], [place], from 12/04/15 was reviewed. He was admitted from 12/03/15-12/04/15. He presented with after an episode of slurred speech, right facial droop, and ataxic gait that lasted about 45 minutes. He apparently had 6 similar episodes since his discharge from the hospital in November 2016. He was admitted for evaluation which included a transthoracic echocardiogram which showed normal ventricular function and no valvular pathology. MRA of the neck showed moderate stenosis of the mid-basilar artery. MRI of the brain showed no evidence of  acute ischemic infarction with "sequela of prior CVA." CT angiogram of the head and neck showed approximately 50% stenosis in the mid-basilar artery with mention that the distal basilar artery is "relatively small caliber." Also noted was "some degree of stenosis" in the right carotid siphon but this could not be quantified because of dense plaque calcification in that region. He was seen in consultation by endovascular neurosurgery. They felt that he was on appropriate antiplatelet therapy with both aspirin and Plavix. They recommended that if his episodes were considered to be true TIAs in the setting of aspirin and Plavix adherence, consideration would be given to full anticoagulation. No intervention was recommended. He was also seen in consultation by interventional radiology and they recommended continuation of medical management treatment as well. Specifically, they felt that the basilar artery did not appear to be severely stenotic and was very small, making it extremely high risk for intervention. He was to follow-up with an interventional radiologist in [place] after discharge home.  Review of discharge summary from [facility] in [place], date of admission 08/03/15 through 08/06/15: He was admitted after presenting with a syncopal episode associated with bradycardia. His [place] details report that he had been out with family and was sitting on a bench with his grandchildren when he stated he did not feel well and then stooped over and became poorly responsive. A healthcare professional was at the  scene and noted the patient had a slow irregular pulse. After a couple of minutes his level of consciousness reportedly improved though he seemed confused. He then apparently had several additional similar episodes lasting approximately 30 seconds each, some of which were associated with bradycardia. He was taken to the emergency department where he had a  witnessed episode this time associated with right arm weakness and slurred speech which then progressed to inability to speak. His heart rate was documented to be normal during this particular episode. The speech changes and weakness lasted several minutes and then rapidly improved back to his baseline. Neurologic consultation was obtained and they demonstrated no abnormalities on his neurologic exam. Imaging revealed a possible acute embolic stroke in the right occipital area and he was admitted for evaluation. He had an MRI scan of the brain which showed "multiple small areas of abnormal restricted diffusion involving the posterior aspect of both cerebral hemispheres predominantly involving occipital lobes; there is a small area in the superior left cerebellum." He then underwent MRA of the brain and neck was noted by the radiologist to be suboptimal though suggested stenosis of the mid basilar artery and the supraclinoid portion of the right internal carotid artery. CT angiogram of the neck and head from 08/06/15 was described as "stenosis of the mid basilar artery and stenosis of the supraclinoid right internal carotid artery." Transesophageal echocardiogram during that admission showed a PFO with mild LVH and moderate atheromatous debris in the descending aorta. An prolonged EEG recording (approximately 17 hours) was obtained during the visit and showed normal background with focal slowing in the left temporal region but no clinical or electrographic seizures. He was evaluated by neurology who recommended he will antiplatelet therapy with aspirin and Plavix due to his intracranial stenoses. He was to follow up with his outpatient neurologist as well as with neuropsychiatry and speech pathology. Cardiology consultation during that visit recommended an elective stress imaging study because of his risk factors for coronary artery disease and apparent abnormalities on EKG during that visit, to be done as an  outpatient. They considered a 30 day event monitor but this was deferred as they felt that his strokes were explained by his basilar artery stenosis. He was discharged on aspirin 81 mg daily, clopidogrel 75 mg daily, and atorvastatin 80 mg daily for secondary stroke prevention.  PMH:  Past Medical History  Diagnosis Date  . Diabetes ([place])   . Skin cancer   . Cataracts, bilateral   . Retinal detachment left eye, 08/2013  . Stroke ([place])   . Stroke due to embolism ([place]) 01/14/2016    PSH:  Past Surgical History  Procedure Laterality Date  . Tonsillectomy  childhood  . Colonoscopy  2002  . Skin cancer removal    . Eye surgery      Family history: Family History  Problem Relation Age of Onset  . Colon cancer Neg Hx   . Esophageal cancer Neg Hx   . Rectal cancer Neg Hx   . Stomach cancer Neg Hx   . Clotting disorder Mother     heart attack  . Stroke Maternal Grandfather     stroke    Social history:  Social History   Social History  . Marital Status: Married    Spouse Name: N/A  . Number of Children: N/A  . Years of Education: N/A   Occupational History  . Not on file.   Social History Main Topics  . Smoking status: Never Smoker   .  Smokeless tobacco: Never Used  . Alcohol Use: Yes     Comment: rare  . Drug Use: No  . Sexual Activity: Not on file   Other Topics Concern  . Not on file   Social History Narrative    Current outpatient meds: Current Meds  Medication Sig  . aspirin 81 MG tablet Take 81 mg by mouth daily.  [name] atorvastatin (LIPITOR) 80 MG tablet Take 80 mg by mouth daily.  . clopidogrel (PLAVIX) 75 MG tablet Take 75 mg by mouth daily.  [name] ibuprofen (ADVIL,MOTRIN) 200 MG tablet Take 400 mg by mouth every 6 (six) hours as needed for headache, mild pain or moderate pain.  . sitaGLIPtin-metformin (JANUMET) 50-500 MG per tablet Take 1 tablet by mouth 2 (two) times daily with a meal.    Current inpatient meds:  No current facility-administered  medications for this encounter.   Current Outpatient Prescriptions  Medication Sig Dispense Refill  . aspirin 81 MG tablet Take 81 mg by mouth daily.    [name] atorvastatin (LIPITOR) 80 MG tablet Take 80 mg by mouth daily.    . clopidogrel (PLAVIX) 75 MG tablet Take 75 mg by mouth daily.    [name] ibuprofen (ADVIL,MOTRIN) 200 MG tablet Take 400 mg by mouth every 6 (six) hours as needed for headache, mild pain or moderate pain.    . sitaGLIPtin-metformin (JANUMET) 50-500 MG per tablet Take 1 tablet by mouth 2 (two) times daily with a meal.    . apixaban (ELIQUIS) 5 MG TABS tablet Take 1 tablet (5 mg total) by mouth 2 (two) times daily. 60 tablet 11    Allergies: No Known Allergies  ROS: As per HPI. A full 14-point review of systems was performed and is otherwise unremarkable.  PE:  BP 161/72 mmHg  Pulse 69  Temp(Src) 98.4 F (36.9 C) (Oral)  Resp 13  SpO2 98%  General: WDWN in no acute distress. AAO x4. Speech clear, no dysarthria. No aphasia. Follows commands briskly. Affect is bright with congruent mood. Comportment is normal.  HEENT: Normocephalic.Sclerae anicteric. No conjunctival injection.  Neuro:  CN: Pupils are equal and round. They are symmetrically reactive from 3-->2 mm. Visual fields are full to confrontation. EOMI with some breakup of smooth pursuits, no nystagmus. No reported diplopia. Facial sensation is intact to light touch. Face is symmetric at rest with normal strength and mobility. Hearing is intact to conversational voice. Palate elevates symmetrically and uvula is midline. Voice is normal in tone, pitch and quality. Bilateral SCM and trapezii are 5/5. Tongue is midline with normal bulk and mobility.  Motor: Normal bulk, tone, and strength. No tremor or other abnormal movements. No drift.  Sensation: Intact to light touch. He has patchy decrease in pinprick that is non-specific and that does not clearly conform to an anatomic distribution. He has absent vibration in both great  toes with reduced vibration at both ankles.  Joint position is abnormal in both great toes and he requires large amplitude deflections of the toes to detect movement.  DTRs: 2+, symmetric at the biceps, 1+ triceps and knees, absent ankles. Toes downgoing bilaterally. No pathologic reflexes.  Coordination: Finger-to-nose and heel-to-shin are slightly unsteady on the left but he has no overt dysmetria. They are normal on the right. Finger taps are normal in amplitude and speed, no decrement.   Labs:  Lab Results  Component Value Date   WBC 9.7 03/26/2016   HGB 15.9 03/26/2016   HCT 46.0 03/26/2016   PLT 187  03/26/2016   GLUCOSE 199* 03/26/2016   ALT 26 03/26/2016   AST 23 03/26/2016   NA 137 03/26/2016   K 4.1 03/26/2016   CL 103 03/26/2016   CREATININE 1.39* 03/26/2016   BUN 21* 03/26/2016   CO2 24 03/26/2016    Imaging:  I have personally and independently reviewed the CT scan of the head without contrast from 03/26/16. This shows evidence of prior infarctions in both occipital lobes. No acute abnormality is noted.  Assessment and Plan:  1. Near syncope: He was orthostatic on presentation and appears to be dehydrated based on laboratory findings and history. I suspect this is the etiology of his symptoms leading up to this presentation. He may be at slightly greater risk of syncopal events from relatively smaller drops in blood pressure because of his pre-existing basilar artery stenosis. He is currently being rehydrated. I do not think there is any need for any additional neuroimaging or other testing at this time. He was encouraged to be certain that he remains adequately hydrated, particularly when he is working outside during these hot summer months. He has been told in the past that he should try to keep his systolic blood pressure around 140 to help mitigate against brainstem hypoperfusion in the setting of basilar artery stenosis. I agree with this recommendation.  2. Basilar artery  stenosis: This is a chronic problem. He has previously been evaluated by interventional vascular radiology as well as endovascular neurosurgery. All recommendations have been for continued medical management. Generally, intracranial stenoses are managed with antiplatelet therapy. Anticoagulation has been associated with increased risk of subarachnoid hemorrhage when used to treat intracranial arterial stenosis and is generally avoided and considered a last resort in patients who have continued TIAs and strokes despite antiplatelet therapy and aggressive risk factor control. I would therefore recommend continued antiplatelet therapy. In addition, current recommendations suggest using aspirin and clopidogrel as dual antiplatelet therapy in this setting for 90 days and then transitioning to monotherapy with aspirin due to the increased risk of symptomatic hemorrhages on dual antiplatelet therapy. It is interesting that his episodes seem to have stopped when he quit taking injections (presumably an anti-VEGF agent) for his macular degeneration, though cerebrovascular events are a known complication of this therapy. That said, risk versus benefit must be weighed in de-escalating his therapy and I will defer this to his outpatient providers. Continue with current management for now with aspirin and Plavix as weel as ongoing aggressive risk factor modification with tight control of blood glucose and lipids. As previously noted, his hypertension shouldn't be too aggressively controlled as this can predispose him to hyperperfusion of his brainstem because of his basilar artery stenosis. A systolic blood pressure [id number] should be targeted.  3. 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 He was not aware of these deficits and both he and his daughter report he has never been told  her has peripheral neuropathy. This is most likely related to his underlying diabetes. I discussed with him the importance of these findings, specifically the loss of proprioception in his feet. This places him at additional risk for falls, particularly in the setting of reduced ambient lighting or on uneven surfaces. This may be compounded by his vision impairment from macular degeneration. Ensure tight glucose control to minimize additional nerve injury. He was advised to use caution in high risk settings, specifically if he  closes his eyes while standing and moving, if he is trying to ambulate in low levels of light, or if he is ambulating on uneven surfaces.  This was discussed at length with the patient and his daughter. They are in agreement with the plan as stated. There were given the opportunity to ask any questions and these were addressed to their satisfaction. Thank you for the opportunity to participate in Mr. James Blevins care. Please don't hesitate to call with any questions or concerns. At this time, I have no additional recommendations and will sign off.

## 2016-03-26 NOTE — ED Notes (Signed)
Pt states that around 1345 he was leaving for his CT appt and noticed increased unsteadiness. Pt states he has had unsteadiness since Monday morning and made the PCP appt for today. States his PCP sent him for a CT brain. Upon arrival to room pt was initially very diaphoretic and lethargic. Pt now alert and states he is feeling much better.

## 2016-03-26 NOTE — ED Notes (Signed)
Pt reports n/v and dizziness. Pt states he did not have dizziness prior to going to [employer], appointment was 1600. Pt has hx of stroke. Pt has deficits from previous stroke. Pt denies any new symptoms.

## 2016-03-26 NOTE — H&P (Signed)
History and Physical    James Blevins [id number] DOB: 07-17-1946 DOA: 03/26/2016  Referring MD/NP/PA: Dr. Hoover Brunette PCP: Tivis Ringer, MD  Patient coming from: [facility] imaging   Chief Complaint: Unsteady gait  HPI: James Blevins is a 70 y.o. male with medical history significant of diabetes mellitus type 2(uncontrolled), CVA, and macular degeneration; who presents with complaints of unsteady gait for the last 3 days. History is provided in collaboration with the patient and family members at bedside including two daughters,  and wife He was sent by his primary to have outpatient CT scan at around 4 PM, but following the scan patient reported having a near syncopal episode. Denies having any loss of consciousness and symptoms were preceded by lightheadedness and feeling off balance. Changes in position seemed to make symptoms worse. Subsequently, he had one episode of nausea with vomiting and dry heaves. Family questioned if symptoms were secondary to possible stroke. They note that over the last few days but these have decreased appetite.    Patient's daughter explains that he had a stroke back in November of 2016, in which he suffered confusion and slurred speech. At that time he was seen at [facility] in [place], [place] and was noted that he had 50% stenosis of the basilar artery with good collaterals. He had been started on atorvastatin, Plavix, and aspirin.  She describes that they had initially possible seizure-like activity, but no seizure activity was seen on EEG. Patient was found to have signs of a patent foramen ovale. Patient also wore a 30 day heart monitor, but there was a question if the patient wore this properly as it was reported to be inconclusive.  Patient was noted to have a couple episodes with similar symptoms including confusion, slurred speech, and possible weakness up until about  February of 2017 when he patient receiving macular degeneration  injections because of an association with increase risks of stroke. He was noted to have subsequent symptoms of stroke at the end of March in 2017, where he underwent full workup including echocardiogram, MRA, MRI, and  CT angiogram of the head and neck. There are no new findings noted. He followed up with Dr. Elson Areas of hematology in May, and his symptoms were felt to be directly related to his uncontrolled diabetes. She gave him the option to be started on Eliquis, but patient never started this medication.  ED Course: Upon admission into the emergency department patient was evaluated and seen to be afebrile with positive orthostatic vital signs. Blood pressure laying flat was 154/69 with a pulse of 75, and  blood pressure standing was 119/74 with a pulse 82. Lab work was relatively unremarkable except for BUN 21, creatinine 1.39, and glucose 199. CT imaging of the head showed no acute abnormalities. Review of records from 12/03/2015 creatinine at that time was noted to be 0.68. Neurology was consulted by the ED physician to get recommendations on need of further workup and/or medication adjustments. [facility] called to admit.    Review of Systems: As per HPI otherwise 10 point review of systems negative.   Past Medical History  Diagnosis Date  . Diabetes ([place])   . Skin cancer   . Cataracts, bilateral   . Retinal detachment left eye, 08/2013  . Stroke ([place])   . Stroke due to embolism ([place]) 01/14/2016    Past Surgical History  Procedure Laterality Date  . Tonsillectomy  childhood  . Colonoscopy  2002  . Skin cancer removal    .  Eye surgery       reports that he has never smoked. He has never used smokeless tobacco. He reports that he drinks alcohol. He reports that he does not use illicit drugs.  No Known Allergies  Family History  Problem Relation Age of Onset  . Colon cancer Neg Hx   . Esophageal cancer Neg Hx   . Rectal cancer Neg Hx   . Stomach cancer Neg Hx   . Clotting disorder Mother      heart attack  . Stroke Maternal Grandfather     stroke    Prior to Admission medications   Medication Sig Start Date End Date Taking? Authorizing Provider  apixaban (ELIQUIS) 5 MG TABS tablet Take 1 tablet (5 mg total) by mouth 2 (two) times daily. 01/14/16   Heath Lark, MD  aspirin 81 MG tablet Take 81 mg by mouth daily.    Historical Provider, MD  atorvastatin (LIPITOR) 80 MG tablet Take 80 mg by mouth daily.    Historical Provider, MD  clopidogrel (PLAVIX) 75 MG tablet Take 75 mg by mouth daily.    Historical Provider, MD  sitaGLIPtin-metformin (JANUMET) 50-500 MG per tablet Take 1 tablet by mouth 2 (two) times daily with a meal.    Historical Provider, MD    Physical Exam: Constitutional: Elderly male who appears NAD, calm, comfortable Filed Vitals:   03/26/16 1837 03/26/16 1842 03/26/16 1845 03/26/16 1900  BP: 125/61 140/91  161/72  Pulse: 72 78  69  Temp:      TempSrc:      Resp: 18  14 13   SpO2: 99% 99%  98%   Eyes: PERRL, lids and conjunctivae normal ENMT: Mucous membranes are moist. Posterior pharynx clear of any exudate or lesions.Normal dentition.  Neck: normal, supple, no masses, no thyromegaly Respiratory: clear to auscultation bilaterally, no wheezing, no crackles. Normal respiratory effort. No accessory muscle use.  Cardiovascular: Regular rate and rhythm, no murmurs / rubs / gallops. No extremity edema. 2+ pedal pulses. No carotid bruits.  Abdomen: no tenderness, no masses palpated. No hepatosplenomegaly. Bowel sounds positive.  Musculoskeletal: no clubbing / cyanosis. No joint deformity upper and lower extremities. Good ROM, no contractures. Normal muscle tone.  Skin: no rashes, lesions, ulcers. No induration Neurologic: CN 2-12 grossly intact. Sensation abnormal of the bilateral lower extremity, DTR normal. Strength 4+/5 in all 4.  Psychiatric: Normal judgment and insight. Alert and oriented x 3. Normal mood.     Labs on Admission: I have personally reviewed  following labs and imaging studies  CBC:  Recent Labs Lab 03/26/16 1816  WBC 9.7  HGB 15.9  HCT 46.0  MCV 89.5  PLT 123XX123   Basic Metabolic Panel:  Recent Labs Lab 03/26/16 1816  NA 137  K 4.1  CL 103  CO2 24  GLUCOSE 199*  BUN 21*  CREATININE 1.39*  CALCIUM 9.3   GFR: CrCl cannot be calculated (Unknown ideal weight.). Liver Function Tests:  Recent Labs Lab 03/26/16 1816  AST 23  ALT 26  ALKPHOS 93  BILITOT 1.2  PROT 6.6  ALBUMIN 4.2    Recent Labs Lab 03/26/16 1816  LIPASE 27   No results for input(s): AMMONIA in the last 168 hours. Coagulation Profile: No results for input(s): INR, PROTIME in the last 168 hours. Cardiac Enzymes: No results for input(s): CKTOTAL, CKMB, CKMBINDEX, TROPONINI in the last 168 hours. BNP (last 3 results) No results for input(s): PROBNP in the last 8760 hours. HbA1C: No results for  input(s): HGBA1C in the last 72 hours. CBG:  Recent Labs Lab 03/26/16 1817  GLUCAP 193*   Lipid Profile: No results for input(s): CHOL, HDL, LDLCALC, TRIG, CHOLHDL, LDLDIRECT in the last 72 hours. Thyroid Function Tests: No results for input(s): TSH, T4TOTAL, FREET4, T3FREE, THYROIDAB in the last 72 hours. Anemia Panel: No results for input(s): VITAMINB12, FOLATE, FERRITIN, TIBC, IRON, RETICCTPCT in the last 72 hours. Urine analysis: No results found for: COLORURINE, APPEARANCEUR, LABSPEC, PHURINE, GLUCOSEU, HGBUR, BILIRUBINUR, KETONESUR, PROTEINUR, UROBILINOGEN, NITRITE, LEUKOCYTESUR Sepsis Labs: No results found for this or any previous visit (from the past 240 hour(s)).   Radiological Exams on Admission: Ct Head Wo Contrast  03/26/2016  CLINICAL DATA:  Dizziness with unsteady gait.  History of strokes. EXAM: CT HEAD WITHOUT CONTRAST TECHNIQUE: Contiguous axial images were obtained from the base of the skull through the vertex without intravenous contrast. COMPARISON:  None. FINDINGS: There is no evidence for acute hemorrhage,  hydrocephalus, mass lesion, or abnormal extra-axial fluid collection. No definite CT evidence for acute infarction. Small focus of encephalomalacia the posterior right parietal lobe is compatible with old infarct. Diffuse loss of parenchymal volume is consistent with atrophy. The visualized paranasal sinuses and mastoid air cells are clear. IMPRESSION: 1. No acute intracranial abnormality. 2. Atrophy with chronic small vessel white matter ischemic demyelination. Electronically Signed   By: Misty Stanley M.D.   On: 03/26/2016 16:59    EKG: Independently reviewed. Normal sinus rhythm with nonspecific T-wave abnormalities  Assessment/Plan Near syncope with orthostatic hypotension: Patient with near syncope after his CT scan today. Found to have positive orthostatic vital signs on admission. CT imaging of the brain negative for any acute abnormalities. Suspect secondary to dehydration given acute kidney injury. - Admit to a telemetry bed - Recheck orthostatics in a.m.  Acute kidney injury secondary to dehydration:Acute.  Patient's baseline creatinine was noted to be 0.68 back on 12/03/2015. On presentation creatinine elevated at 1.39 with a BUN of 21. Patient endorses decreased po intake. - IV fluids normal saline  - recheck BMP in am  Nausea and vomiting: Now resolved patient had one episode following near syncopal episode. - Follow-up urinalysis - Zofran prn N/V - Check troponin  Abnormal gait: Acutely worsen over the last 3 days. - Physical therapy to eval and treat - Check vitamin B12 in am   History of CVA/basilar artery stenosis: Patient was noted to have a stroke back in November 2016 without residual symptoms. Patient subsequently had strokelike symptoms for which he was again worked up in 11/2015. - Plavix and ASA - Question if patient was evaluated for need of closure of PFO - Follow up neurology recommendations  Diabetes mellitus type 2, Uncontrolled with peripheral neuropathy: Last  hemoglobin A1c was noted to be 8.4 today per family. - Held Janumet - CBGs every before meals and at bedtime with sensitive sliding scale insulin - Patient may benefit from diabetic education in a.m.   Hyperlipidemia -  continue Liptor  DVT prophylaxis: lovenox Code Status: Full  Family Communication:  Discussed plan with the patient and family members present at bedside. Disposition Plan: Possible discharge home in 1-2 days Consults called: Neurology Admission status: Telemetry observation  Norval Morton MD [facility] Pager [phone]  If 7PM-7AM, please contact night-coverage [email or web address] Password [facility]  03/26/2016, 7:17 PM

## 2016-03-27 ENCOUNTER — Encounter (HOSPITAL_COMMUNITY): Payer: Self-pay | Admitting: *Deleted

## 2016-03-27 DIAGNOSIS — I951 Orthostatic hypotension: Secondary | ICD-10-CM | POA: Diagnosis not present

## 2016-03-27 DIAGNOSIS — E860 Dehydration: Secondary | ICD-10-CM | POA: Diagnosis present

## 2016-03-27 DIAGNOSIS — N179 Acute kidney failure, unspecified: Secondary | ICD-10-CM | POA: Diagnosis not present

## 2016-03-27 DIAGNOSIS — R55 Syncope and collapse: Secondary | ICD-10-CM | POA: Diagnosis not present

## 2016-03-27 LAB — URINALYSIS, ROUTINE W REFLEX MICROSCOPIC
Bilirubin Urine: NEGATIVE
Hgb urine dipstick: NEGATIVE
KETONES UR: 15 mg/dL — AB
LEUKOCYTES UA: NEGATIVE
NITRITE: NEGATIVE
PROTEIN: 30 mg/dL — AB
Specific Gravity, Urine: 1.028 (ref 1.005–1.030)
pH: 5 (ref 5.0–8.0)

## 2016-03-27 LAB — BASIC METABOLIC PANEL
ANION GAP: 6 (ref 5–15)
BUN: 25 mg/dL — ABNORMAL HIGH (ref 6–20)
CALCIUM: 8.7 mg/dL — AB (ref 8.9–10.3)
CO2: 28 mmol/L (ref 22–32)
Chloride: 103 mmol/L (ref 101–111)
Creatinine, Ser: 1.1 mg/dL (ref 0.61–1.24)
GLUCOSE: 178 mg/dL — AB (ref 65–99)
POTASSIUM: 4.1 mmol/L (ref 3.5–5.1)
Sodium: 137 mmol/L (ref 135–145)

## 2016-03-27 LAB — VITAMIN B12: VITAMIN B 12: 119 pg/mL — AB (ref 180–914)

## 2016-03-27 LAB — CBC
HEMATOCRIT: 38.7 % — AB (ref 39.0–52.0)
HEMOGLOBIN: 13.3 g/dL (ref 13.0–17.0)
MCH: 30.5 pg (ref 26.0–34.0)
MCHC: 34.4 g/dL (ref 30.0–36.0)
MCV: 88.8 fL (ref 78.0–100.0)
Platelets: 143 10*3/uL — ABNORMAL LOW (ref 150–400)
RBC: 4.36 MIL/uL (ref 4.22–5.81)
RDW: 12.8 % (ref 11.5–15.5)
WBC: 5.9 10*3/uL (ref 4.0–10.5)

## 2016-03-27 LAB — URINE MICROSCOPIC-ADD ON: RBC / HPF: NONE SEEN RBC/hpf (ref 0–5)

## 2016-03-27 LAB — GLUCOSE, CAPILLARY
GLUCOSE-CAPILLARY: 182 mg/dL — AB (ref 65–99)
Glucose-Capillary: 146 mg/dL — ABNORMAL HIGH (ref 65–99)
Glucose-Capillary: 233 mg/dL — ABNORMAL HIGH (ref 65–99)

## 2016-03-27 LAB — TSH: TSH: 3.112 u[IU]/mL (ref 0.350–4.500)

## 2016-03-27 NOTE — Care Management Obs Status (Signed)
MEDICARE OBSERVATION STATUS NOTIFICATION   Patient Details  Name: James Blevins MRN: [id number] Date of Birth: 1946/03/21   Medicare Observation Status Notification Given:  Yes    Zenon Mayo, RN 03/27/2016, 4:18 PM

## 2016-03-27 NOTE — Care Management Note (Signed)
Case Management Note  Patient Details  Name: LEMAR LYGA MRN: [id number] Date of Birth: Sep 25, 1945  Subjective/Objective:      Patient is from home with spouse, indep pta, no needs.              Action/Plan:   Expected Discharge Date:                  Expected Discharge Plan:  Home/Self Care  In-House Referral:     Discharge planning Services  CM Consult  Post Acute Care Choice:    Choice offered to:     DME Arranged:    DME Agency:     HH Arranged:    HH Agency:     Status of Service:  Completed, signed off  If discussed at [employer] of Stay Meetings, dates discussed:    Additional Comments:  Zenon Mayo, RN 03/27/2016, 4:27 PM

## 2016-03-27 NOTE — Progress Notes (Signed)
Inpatient Diabetes Program Recommendations  AACE/ADA: New Consensus Statement on Inpatient Glycemic Control (2015)  Target Ranges:  Prepandial:   less than 140 mg/dL      Peak postprandial:   less than 180 mg/dL (1-2 hours)      Critically ill patients:  140 - 180 mg/dL   Results for CROWLEY, KRASNER (MRN [id number]) as of 03/27/2016 13:48  Ref. Range 03/26/2016 18:17 03/26/2016 21:47 03/27/2016 06:39 03/27/2016 11:32  Glucose-Capillary Latest Ref Range: 65-99 mg/dL 193 (H) 228 (H) 146 (H) 233 (H)    Review of Glycemic Control  Diabetes history: DM 2 Outpatient Diabetes medications: Janumet 50-500 qd Current orders for Inpatient glycemic control: Novolog correction 0-9 units tid with meals  Inpatient Diabetes Program Recommendations:  Noted elevated CBGs. Please consider A1c to assess prehospital glycemic control and add hs Novolog correction of 0-5 units. Ordered Living Well with Diabetes book for patient and family per request noted.  Thank you, Nani Gasser. Kem Hensen, RN, MSN, CDE Inpatient Glycemic Control Team Team Pager [phone] (8am-5pm) 03/27/2016 1:51 PM

## 2016-03-27 NOTE — Discharge Summary (Signed)
Physician Discharge Summary  James Blevins [id number] DOB: October 01, 1945 DOA: 03/26/2016  PCP: Tivis Ringer, MD  Admit date: 03/26/2016 Discharge date: 03/27/2016  Admitted From: (Home, ) Disposition:  (Home )  Recommendations for Outpatient Follow-up:  1. Follow up with PCP in 1-2 weeks 2. Please obtain BMP/CBC in one week 3. Please follow up with neurology as needed.     Discharge Condition:stable. Diet recommendation: Heart Healthy   Brief/Interim Summary:  James Blevins is a 70 y.o. male with medical history significant of diabetes mellitus type 2(uncontrolled), CVA, and macular degeneration; who presents with complaints of unsteady gait for the last 3 days.  Discharge Diagnoses:  Principal Problem:   Near syncope Active Problems:   Type 2 diabetes mellitus with retinopathy of both eyes, without long-term current use of insulin (HCC)   Patent foramen ovale   Orthostatic hypotension   Dehydration   AKI (acute kidney injury) ([place]) Near syncope with orthostatic hypotension: Patient with near syncope after his CT scan today. Found to have positive orthostatic vital signs on admission. CT imaging of the brain negative for any acute abnormalities. Suspect secondary to dehydration given acute kidney injury. Repeat orthostatics improved.  Discharge home with pt education   Acute kidney injury secondary to dehydration:Acute.  Patient's baseline creatinine was noted to be 0.68 back on 12/03/2015. On presentation creatinine elevated at 1.39 with a BUN of 21. Patient endorses decreased po intake. IV hydration and repeat renal parameters normalized.   Nausea and vomiting: Now resolved patient had one episode following near syncopal episode. Probably vagal respose from orthostatic hypotension.   Abnormal gait: Acutely worsen over the last 3 days. - Physical therapy to eval and treat   History of CVA/basilar artery stenosis: Patient was noted to have a stroke back in  November 2016 without residual symptoms. Patient subsequently had strokelike symptoms for which he was again worked up in 11/2015. - Plavix and ASA - neurology consulted and recommendations given.  Outpatient neurology follow upo.   Diabetes mellitus type 2, Uncontrolled with peripheral neuropathy: Last hemoglobin A1c was noted to be 8.4 today per family. Resume home meds.   Needs tighter control of cbg's.  Hyperlipidemia -  continue Liptor    Discharge Instructions  Discharge Instructions    Diet general    Complete by:  As directed      Discharge instructions    Complete by:  As directed   Follow up with PCP in one week.            Medication List    STOP taking these medications        apixaban 5 MG Tabs tablet  Commonly known as:  ELIQUIS      TAKE these medications        aspirin 81 MG tablet  Take 81 mg by mouth daily.     atorvastatin 80 MG tablet  Commonly known as:  LIPITOR  Take 80 mg by mouth daily.     clopidogrel 75 MG tablet  Commonly known as:  PLAVIX  Take 75 mg by mouth daily.     ibuprofen 200 MG tablet  Commonly known as:  ADVIL,MOTRIN  Take 400 mg by mouth every 6 (six) hours as needed for headache, mild pain or moderate pain.     sitaGLIPtin-metformin 50-500 MG tablet  Commonly known as:  JANUMET  Take 1 tablet by mouth 2 (two) times daily with a meal.  Follow-up Information    Follow up with Tivis Ringer, MD. Schedule an appointment as soon as possible for a visit in 1 week.   Specialty:  Internal Medicine   Contact information:   [address] [place] [address] [phone]      No Known Allergies  Consultations: none  Procedures/Studies: Ct Head Wo Contrast  03/26/2016  CLINICAL DATA:  Dizziness with unsteady gait.  History of strokes. EXAM: CT HEAD WITHOUT CONTRAST TECHNIQUE: Contiguous axial images were obtained from the base of the skull through the vertex without intravenous contrast.  COMPARISON:  None. FINDINGS: There is no evidence for acute hemorrhage, hydrocephalus, mass lesion, or abnormal extra-axial fluid collection. No definite CT evidence for acute infarction. Small focus of encephalomalacia the posterior right parietal lobe is compatible with old infarct. Diffuse loss of parenchymal volume is consistent with atrophy. The visualized paranasal sinuses and mastoid air cells are clear. IMPRESSION: 1. No acute intracranial abnormality. 2. Atrophy with chronic small vessel white matter ischemic demyelination. Electronically Signed   By: Misty Stanley M.D.   On: 03/26/2016 16:59    none   Subjective: No new complaints.   Discharge Exam: Filed Vitals:   03/27/16 1152 03/27/16 1555  BP: 139/70 141/76  Pulse: 68 68  Temp: 98.4 F (36.9 C) 97.9 F (36.6 C)  Resp: 18 18   Filed Vitals:   03/27/16 0422 03/27/16 0640 03/27/16 1152 03/27/16 1555  BP:  157/78 139/70 141/76  Pulse:  75 68 68  Temp:  98 F (36.7 C) 98.4 F (36.9 C) 97.9 F (36.6 C)  TempSrc:  Oral Oral Oral  Resp:  16 18 18   Height:      Weight: 82.555 kg (182 lb)     SpO2:  95% 96% 98%    General: Pt is alert, awake, not in acute distress Cardiovascular: RRR, S1/S2 +, no rubs, no gallops Respiratory: CTA bilaterally, no wheezing, no rhonchi Abdominal: Soft, NT, ND, bowel sounds + Extremities: no edema, no cyanosis    The results of significant diagnostics from this hospitalization (including imaging, microbiology, ancillary and laboratory) are listed below for reference.     Microbiology: No results found for this or any previous visit (from the past 240 hour(s)).   Labs: BNP (last 3 results) No results for input(s): BNP in the last 8760 hours. Basic Metabolic Panel:  Recent Labs Lab 03/26/16 1816 03/27/16 0338  NA 137 137  K 4.1 4.1  CL 103 103  CO2 24 28  GLUCOSE 199* 178*  BUN 21* 25*  CREATININE 1.39* 1.10  CALCIUM 9.3 8.7*   Liver Function Tests:  Recent Labs Lab  03/26/16 1816  AST 23  ALT 26  ALKPHOS 93  BILITOT 1.2  PROT 6.6  ALBUMIN 4.2    Recent Labs Lab 03/26/16 1816  LIPASE 27   No results for input(s): AMMONIA in the last 168 hours. CBC:  Recent Labs Lab 03/26/16 1816 03/27/16 0338  WBC 9.7 5.9  HGB 15.9 13.3  HCT 46.0 38.7*  MCV 89.5 88.8  PLT 187 143*   Cardiac Enzymes:  Recent Labs Lab 03/26/16 2118  TROPONINI <0.03   BNP: Invalid input(s): POCBNP CBG:  Recent Labs Lab 03/26/16 1817 03/26/16 2147 03/27/16 0639 03/27/16 1132  GLUCAP 193* 228* 146* 233*   D-Dimer No results for input(s): DDIMER in the last 72 hours. Hgb A1c No results for input(s): HGBA1C in the last 72 hours. Lipid Profile No results for input(s): CHOL, HDL, LDLCALC, TRIG,  CHOLHDL, LDLDIRECT in the last 72 hours. Thyroid function studies  Recent Labs  03/27/16 0338  TSH 3.112   Anemia work up  Recent Labs  03/27/16 0338  VITAMINB12 119*   Urinalysis    Component Value Date/Time   COLORURINE YELLOW 03/27/2016 0428   APPEARANCEUR CLOUDY* 03/27/2016 0428   LABSPEC 1.028 03/27/2016 0428   PHURINE 5.0 03/27/2016 0428   GLUCOSEU >1000* 03/27/2016 0428   HGBUR NEGATIVE 03/27/2016 0428   BILIRUBINUR NEGATIVE 03/27/2016 0428   KETONESUR 15* 03/27/2016 0428   PROTEINUR 30* 03/27/2016 0428   NITRITE NEGATIVE 03/27/2016 0428   LEUKOCYTESUR NEGATIVE 03/27/2016 0428   Sepsis Labs Invalid input(s): PROCALCITONIN,  WBC,  LACTICIDVEN Microbiology No results found for this or any previous visit (from the past 240 hour(s)).   Time coordinating discharge: Over 30 minutes  SIGNED:   Hosie Poisson, MD  [facility] 03/27/2016, 4:09 PM Pager [phone]   If 7PM-7AM, please contact night-coverage [email or web address] Password [email or web address]

## 2016-03-27 NOTE — Evaluation (Addendum)
Physical Therapy One Time Evaluation Patient Details Name: James Blevins MRN: [id number] DOB: 1946/05/01 Today's Date: 03/27/2016   History of Present Illness  70 y.o. male with medical history significant of diabetes mellitus type 2(uncontrolled), CVA, and macular degeneration and was sent by his primary to have outpatient CT scan but following the scan patient reported having a near syncopal episode; found to have positive orthostatic vitals  Clinical Impression  Patient evaluated by Physical Therapy with no further acute PT needs identified. All education has been completed and the patient has no further questions.  Pt mobilizing well and denies any symptoms during ambulation.  Pt educated to cease activity if dizzy occurs until resolved.  Per chart review, pt with some peripheral neuropathy so also reviewed pt checking his feet daily.  No unsteadiness or LOB observed with mobility today. See below for any follow-up Physical Therapy or equipment needs. PT is signing off. Thank you for this referral.     Follow Up Recommendations No PT follow up    Equipment Recommendations  None recommended by PT    Recommendations for Other Services       Precautions / Restrictions Precautions Precautions: Fall      Mobility  Bed Mobility Overal bed mobility: Modified Independent             General bed mobility comments: HOB elevated  Transfers Overall transfer level: Needs assistance Equipment used: None Transfers: Sit to/from Stand Sit to Stand: Min guard;Supervision            Ambulation/Gait Ambulation/Gait assistance: Min guard;Supervision Ambulation Distance (Feet): 400 Feet Assistive device: None Gait Pattern/deviations: WFL(Within Functional Limits)     General Gait Details: pushed IV pole but not required for support, no unsteadiness or LOB observed, pt denies dizziness  Stairs            Wheelchair Mobility    Modified Rankin (Stroke Patients Only)       Balance Overall balance assessment:  (denies fall hx)                                           Pertinent Vitals/Pain Pain Assessment: No/denies pain    Home Living Family/patient expects to be discharged to:: Private residence Living Arrangements: Spouse/significant other   Type of Home: House       Home Layout: Able to live on main level with bedroom/bathroom Home Equipment: None      Prior Function Level of Independence: Independent               Hand Dominance        Extremity/Trunk Assessment               Lower Extremity Assessment: Overall WFL for tasks assessed         Communication   Communication: No difficulties  Cognition Arousal/Alertness: Awake/alert Behavior During Therapy: WFL for tasks assessed/performed Overall Cognitive Status: Within Functional Limits for tasks assessed                      General Comments      Exercises        Assessment/Plan    PT Assessment Patent does not need any further PT services  PT Diagnosis Difficulty walking   PT Problem List    PT Treatment Interventions     PT Goals (Current goals can  be found in the Care Plan section) Acute Rehab PT Goals PT Goal Formulation: All assessment and education complete, DC therapy    Frequency     Barriers to discharge        Co-evaluation               End of Session Equipment Utilized During Treatment: Gait belt Activity Tolerance: Patient tolerated treatment well Patient left: in bed;with call bell/phone within reach;with family/visitor present      Functional Assessment Tool Used: clinical judgement Functional Limitation: Mobility: Walking and moving around Mobility: Walking and Moving Around Current Status [id number]): At least 1 percent but less than 20 percent impaired, limited or restricted Mobility: Walking and Moving Around Goal Status [phone]): 0 percent impaired, limited or restricted Mobility:  Walking and Moving Around Discharge Status [phone]): 0 percent impaired, limited or restricted    Time: 1325-1336 PT Time Calculation (min) (ACUTE ONLY): 11 min   Charges:   PT Evaluation $PT Eval Low Complexity: 1 Procedure     PT G Codes:   PT G-Codes **NOT FOR INPATIENT CLASS** Functional Assessment Tool Used: clinical judgement Functional Limitation: Mobility: Walking and moving around Mobility: Walking and Moving Around Current Status [id number]): At least 1 percent but less than 20 percent impaired, limited or restricted Mobility: Walking and Moving Around Goal Status [phone]): 0 percent impaired, limited or restricted Mobility: Walking and Moving Around Discharge Status [phone]): 0 percent impaired, limited or restricted    Oswald Pott,KATHrine E 03/27/2016, 2:31 PM Carmelia Bake, PT, DPT 03/27/2016 Pager: [phone]

## 2016-03-27 NOTE — Progress Notes (Signed)
Orders received for pt discharge.  Discharge summary printed and reviewed with pt.  Explained medication regimen, and pt had no further questions at this time.  IV removed and site remains clean, dry, intact.  Telemetry removed.  Pt in stable condition and awaiting transport. 

## 2016-03-27 NOTE — Progress Notes (Signed)
Per physician instructions, spent 30-45 min explaining orthostatic hypotension and dehydration to pt and wife.  Explained the causes and treatments for both orthostatic hypotension and dehydration, and when to seek medical advice, and when to come to the ER.  Educated pt and wife on the importance of checking his blood sugar a minimum of 2x per day.  They had no further questions at this time.

## 2016-04-01 DIAGNOSIS — I639 Cerebral infarction, unspecified: Secondary | ICD-10-CM | POA: Diagnosis not present

## 2016-04-01 DIAGNOSIS — R2681 Unsteadiness on feet: Secondary | ICD-10-CM | POA: Diagnosis not present

## 2016-04-01 DIAGNOSIS — N289 Disorder of kidney and ureter, unspecified: Secondary | ICD-10-CM | POA: Diagnosis not present

## 2016-04-01 DIAGNOSIS — R55 Syncope and collapse: Secondary | ICD-10-CM | POA: Diagnosis not present

## 2016-04-01 DIAGNOSIS — E1165 Type 2 diabetes mellitus with hyperglycemia: Secondary | ICD-10-CM | POA: Diagnosis not present

## 2016-04-07 DIAGNOSIS — N2889 Other specified disorders of kidney and ureter: Secondary | ICD-10-CM | POA: Diagnosis not present

## 2016-04-07 DIAGNOSIS — E1165 Type 2 diabetes mellitus with hyperglycemia: Secondary | ICD-10-CM | POA: Diagnosis not present

## 2016-04-12 ENCOUNTER — Observation Stay (HOSPITAL_COMMUNITY)
Admission: EM | Admit: 2016-04-12 | Discharge: 2016-04-14 | Disposition: A | Discharge status: Home / Self Care | Payer: Medicare Other | Attending: Internal Medicine | Admitting: Internal Medicine

## 2016-04-12 ENCOUNTER — Emergency Department (HOSPITAL_COMMUNITY): Payer: Medicare Other

## 2016-04-12 ENCOUNTER — Encounter (HOSPITAL_COMMUNITY): Payer: Self-pay | Admitting: *Deleted

## 2016-04-12 ENCOUNTER — Observation Stay (HOSPITAL_COMMUNITY): Payer: Medicare Other

## 2016-04-12 DIAGNOSIS — Z79899 Other long term (current) drug therapy: Secondary | ICD-10-CM | POA: Diagnosis not present

## 2016-04-12 DIAGNOSIS — I10 Essential (primary) hypertension: Secondary | ICD-10-CM | POA: Diagnosis not present

## 2016-04-12 DIAGNOSIS — R4781 Slurred speech: Secondary | ICD-10-CM

## 2016-04-12 DIAGNOSIS — R404 Transient alteration of awareness: Secondary | ICD-10-CM | POA: Diagnosis not present

## 2016-04-12 DIAGNOSIS — E113593 Type 2 diabetes mellitus with proliferative diabetic retinopathy without macular edema, bilateral: Secondary | ICD-10-CM | POA: Diagnosis not present

## 2016-04-12 DIAGNOSIS — D696 Thrombocytopenia, unspecified: Secondary | ICD-10-CM | POA: Diagnosis not present

## 2016-04-12 DIAGNOSIS — Z7982 Long term (current) use of aspirin: Secondary | ICD-10-CM | POA: Insufficient documentation

## 2016-04-12 DIAGNOSIS — E538 Deficiency of other specified B group vitamins: Secondary | ICD-10-CM | POA: Diagnosis not present

## 2016-04-12 DIAGNOSIS — Z8673 Personal history of transient ischemic attack (TIA), and cerebral infarction without residual deficits: Secondary | ICD-10-CM | POA: Insufficient documentation

## 2016-04-12 DIAGNOSIS — Z7984 Long term (current) use of oral hypoglycemic drugs: Secondary | ICD-10-CM | POA: Diagnosis not present

## 2016-04-12 DIAGNOSIS — R55 Syncope and collapse: Secondary | ICD-10-CM | POA: Diagnosis present

## 2016-04-12 DIAGNOSIS — Z7902 Long term (current) use of antithrombotics/antiplatelets: Secondary | ICD-10-CM | POA: Insufficient documentation

## 2016-04-12 DIAGNOSIS — G459 Transient cerebral ischemic attack, unspecified: Principal | ICD-10-CM | POA: Diagnosis present

## 2016-04-12 DIAGNOSIS — E785 Hyperlipidemia, unspecified: Secondary | ICD-10-CM | POA: Diagnosis not present

## 2016-04-12 DIAGNOSIS — E860 Dehydration: Secondary | ICD-10-CM | POA: Diagnosis not present

## 2016-04-12 DIAGNOSIS — E1136 Type 2 diabetes mellitus with diabetic cataract: Secondary | ICD-10-CM | POA: Insufficient documentation

## 2016-04-12 DIAGNOSIS — R29818 Other symptoms and signs involving the nervous system: Secondary | ICD-10-CM | POA: Diagnosis not present

## 2016-04-12 DIAGNOSIS — E11319 Type 2 diabetes mellitus with unspecified diabetic retinopathy without macular edema: Secondary | ICD-10-CM | POA: Insufficient documentation

## 2016-04-12 LAB — DIFFERENTIAL
BASOS PCT: 0 %
Basophils Absolute: 0 10*3/uL (ref 0.0–0.1)
EOS ABS: 0 10*3/uL (ref 0.0–0.7)
Eosinophils Relative: 1 %
Lymphocytes Relative: 12 %
Lymphs Abs: 0.9 10*3/uL (ref 0.7–4.0)
MONO ABS: 0.5 10*3/uL (ref 0.1–1.0)
MONOS PCT: 7 %
NEUTROS ABS: 6 10*3/uL (ref 1.7–7.7)
Neutrophils Relative %: 80 %

## 2016-04-12 LAB — CBC WITH DIFFERENTIAL/PLATELET
BASOS PCT: 0 %
Basophils Absolute: 0 10*3/uL (ref 0.0–0.1)
Eosinophils Absolute: 0.1 10*3/uL (ref 0.0–0.7)
Eosinophils Relative: 2 %
HEMATOCRIT: 42.9 % (ref 39.0–52.0)
Hemoglobin: 14.7 g/dL (ref 13.0–17.0)
Lymphocytes Relative: 12 %
Lymphs Abs: 0.8 10*3/uL (ref 0.7–4.0)
MCH: 30.8 pg (ref 26.0–34.0)
MCHC: 34.3 g/dL (ref 30.0–36.0)
MCV: 89.9 fL (ref 78.0–100.0)
MONO ABS: 0.5 10*3/uL (ref 0.1–1.0)
MONOS PCT: 8 %
NEUTROS PCT: 78 %
Neutro Abs: 5.4 10*3/uL (ref 1.7–7.7)
Platelets: 147 10*3/uL — ABNORMAL LOW (ref 150–400)
RBC: 4.77 MIL/uL (ref 4.22–5.81)
RDW: 12.7 % (ref 11.5–15.5)
WBC: 6.9 10*3/uL (ref 4.0–10.5)

## 2016-04-12 LAB — I-STAT CHEM 8, ED
BUN: 19 mg/dL (ref 6–20)
CALCIUM ION: 1.08 mmol/L — AB (ref 1.12–1.23)
CHLORIDE: 103 mmol/L (ref 101–111)
CREATININE: 0.8 mg/dL (ref 0.61–1.24)
GLUCOSE: 164 mg/dL — AB (ref 65–99)
HCT: 40 % (ref 39.0–52.0)
HEMOGLOBIN: 13.6 g/dL (ref 13.0–17.0)
POTASSIUM: 4.3 mmol/L (ref 3.5–5.1)
Sodium: 142 mmol/L (ref 135–145)
TCO2: 26 mmol/L (ref 0–100)

## 2016-04-12 LAB — COMPREHENSIVE METABOLIC PANEL
ALK PHOS: 73 U/L (ref 38–126)
ALT: 20 U/L (ref 17–63)
ALT: 21 U/L (ref 17–63)
ANION GAP: 6 (ref 5–15)
AST: 24 U/L (ref 15–41)
AST: 21 U/L (ref 15–41)
Albumin: 3.8 g/dL (ref 3.5–5.0)
Albumin: 3.6 g/dL (ref 3.5–5.0)
Alkaline Phosphatase: 70 U/L (ref 38–126)
Anion gap: 8 (ref 5–15)
BILIRUBIN TOTAL: 1 mg/dL (ref 0.3–1.2)
BUN: 17 mg/dL (ref 6–20)
BUN: 17 mg/dL (ref 6–20)
CALCIUM: 9.2 mg/dL (ref 8.9–10.3)
CALCIUM: 8.8 mg/dL — AB (ref 8.9–10.3)
CHLORIDE: 105 mmol/L (ref 101–111)
CHLORIDE: 107 mmol/L (ref 101–111)
CO2: 26 mmol/L (ref 22–32)
CO2: 27 mmol/L (ref 22–32)
CREATININE: 1.06 mg/dL (ref 0.61–1.24)
Creatinine, Ser: 0.9 mg/dL (ref 0.61–1.24)
Glucose, Bld: 209 mg/dL — ABNORMAL HIGH (ref 65–99)
Glucose, Bld: 166 mg/dL — ABNORMAL HIGH (ref 65–99)
POTASSIUM: 4.4 mmol/L (ref 3.5–5.1)
Potassium: 4.3 mmol/L (ref 3.5–5.1)
SODIUM: 140 mmol/L (ref 135–145)
Sodium: 139 mmol/L (ref 135–145)
TOTAL PROTEIN: 6.1 g/dL — AB (ref 6.5–8.1)
TOTAL PROTEIN: 6 g/dL — AB (ref 6.5–8.1)
Total Bilirubin: 0.9 mg/dL (ref 0.3–1.2)

## 2016-04-12 LAB — I-STAT TROPONIN, ED
Troponin i, poc: 0 ng/mL (ref 0.00–0.08)
Troponin i, poc: 0 ng/mL (ref 0.00–0.08)

## 2016-04-12 LAB — CBC
HEMATOCRIT: 42.3 % (ref 39.0–52.0)
Hemoglobin: 14.3 g/dL (ref 13.0–17.0)
MCH: 30.3 pg (ref 26.0–34.0)
MCHC: 33.8 g/dL (ref 30.0–36.0)
MCV: 89.6 fL (ref 78.0–100.0)
PLATELETS: 149 10*3/uL — AB (ref 150–400)
RBC: 4.72 MIL/uL (ref 4.22–5.81)
RDW: 12.8 % (ref 11.5–15.5)
WBC: 7.5 10*3/uL (ref 4.0–10.5)

## 2016-04-12 LAB — CK: Total CK: 90 U/L (ref 49–397)

## 2016-04-12 LAB — ETHANOL

## 2016-04-12 LAB — APTT: APTT: 26 s (ref 24–36)

## 2016-04-12 LAB — GLUCOSE, CAPILLARY: GLUCOSE-CAPILLARY: 144 mg/dL — AB (ref 65–99)

## 2016-04-12 LAB — PROTIME-INR
INR: 1.1
PROTHROMBIN TIME: 14.3 s (ref 11.4–15.2)

## 2016-04-12 IMAGING — CT CT HEAD CODE STROKE
3 of 4 series · 18 of 47 positions shown, 21 images · non-contrast
Comparison: [DATE]

CLINICAL DATA: Code stroke. New onset slurred speech and right arm
weakness.

EXAM:
CT HEAD WITHOUT CONTRAST
TECHNIQUE: Contiguous axial images were obtained from the base of the skull
through the vertex without intravenous contrast.

[Series 201: head w/o, idose (1) · axial · non-contrast · 0.41mm/px · z∈[+68,+193]mm · 12 of 31 slices shown, 15 images]
[im 3/31  brain]
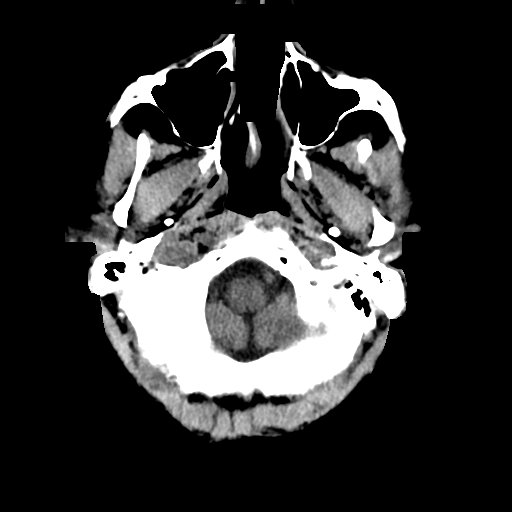
[im 3/31  bone]
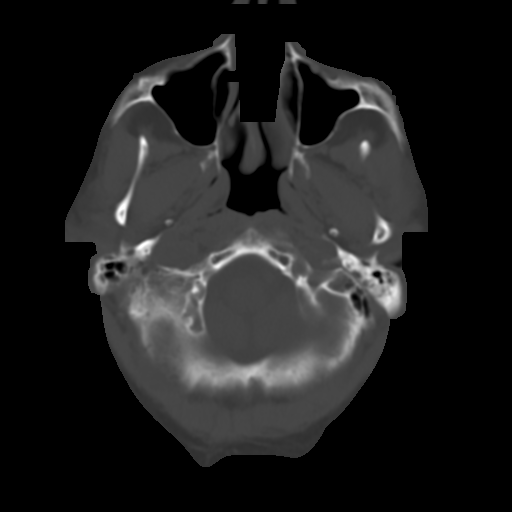
[im 5/31  brain]
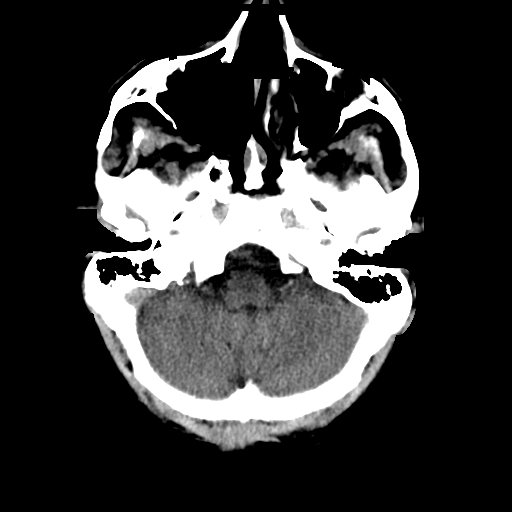
[im 7/31  brain]
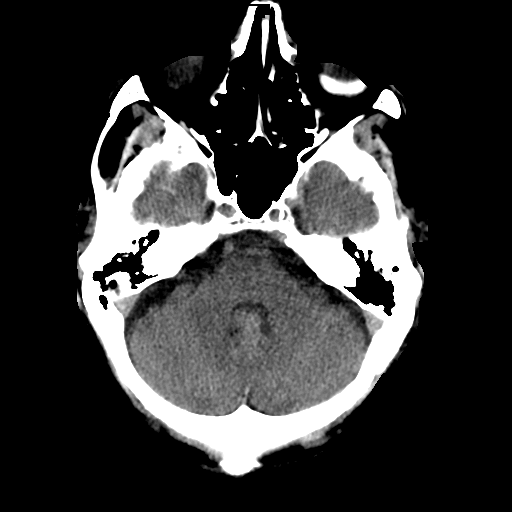
[im 9/31  brain]
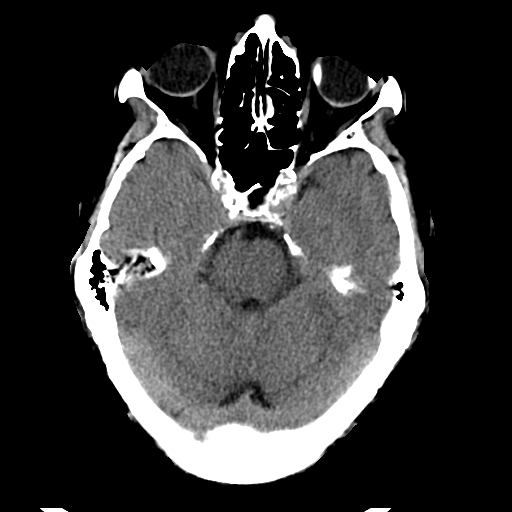
[im 11/31  brain]
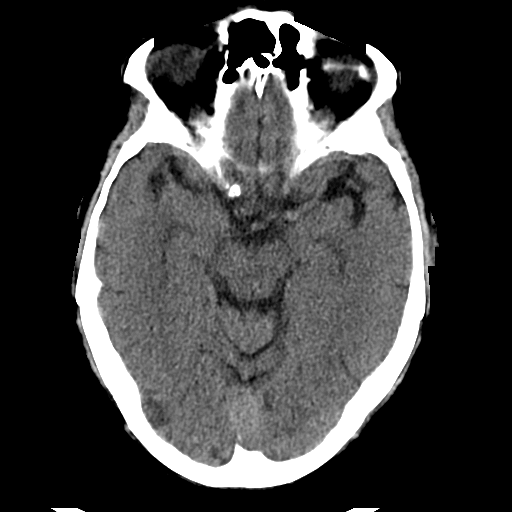
[im 11/31  bone]
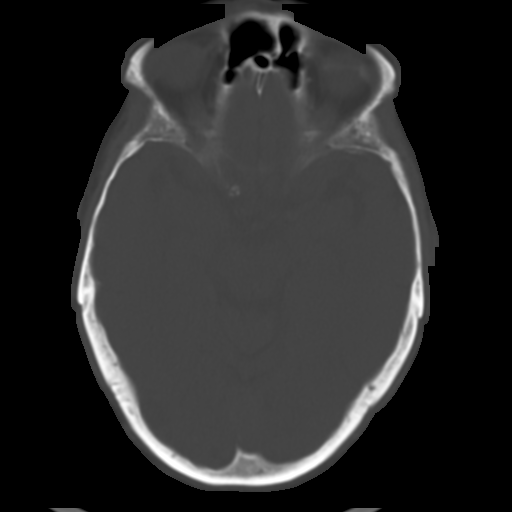
[im 13/31  brain]
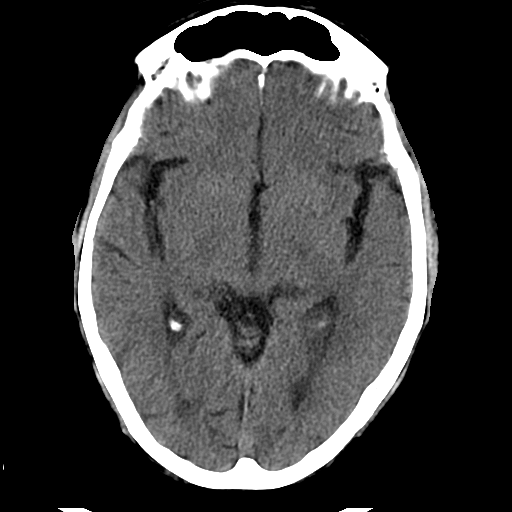
[im 18/31  brain]
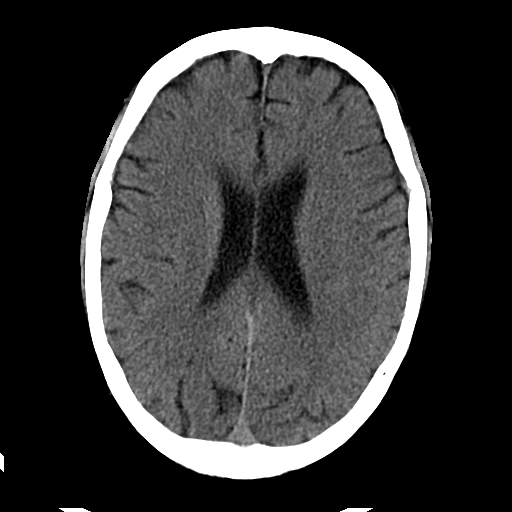
[im 20/31  brain]
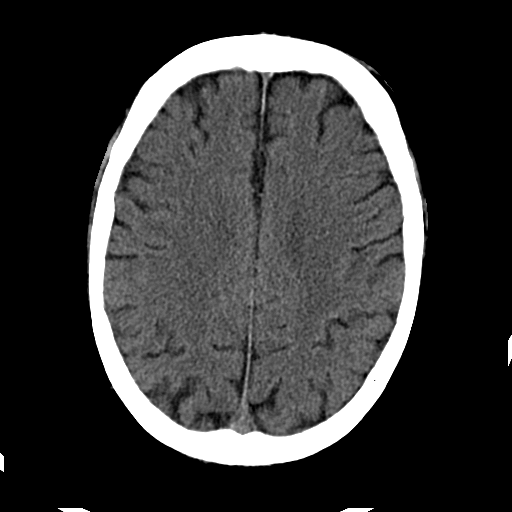
[im 22/31  brain]
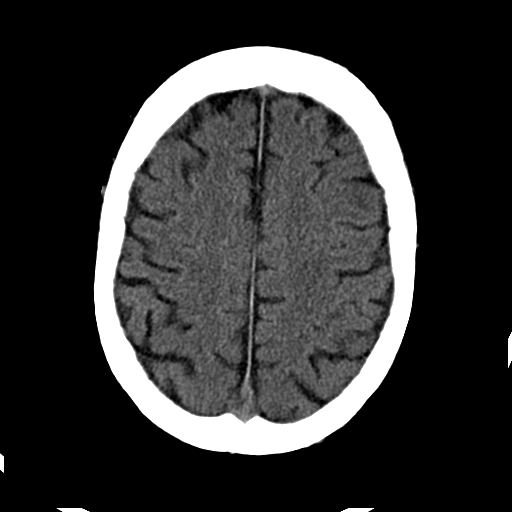
[im 22/31  bone]
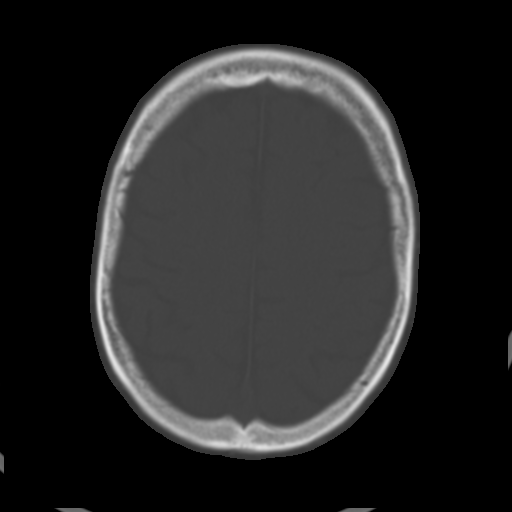
[im 24/31  brain]
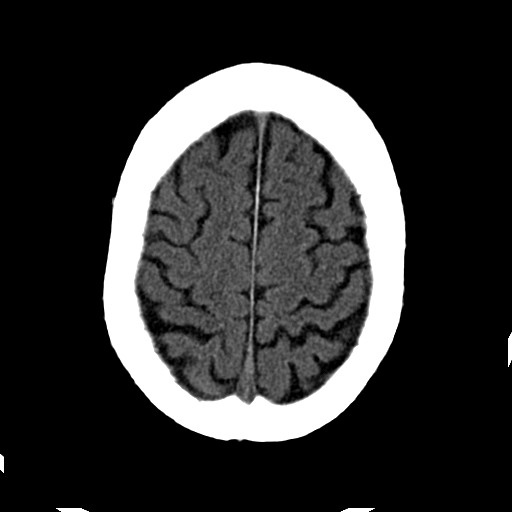
[im 26/31  brain]
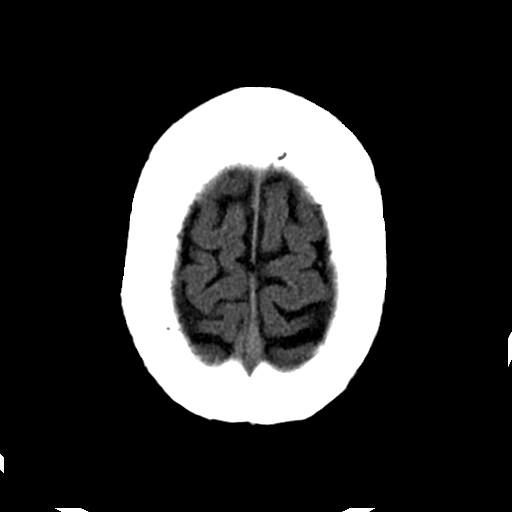
[im 28/31  brain]
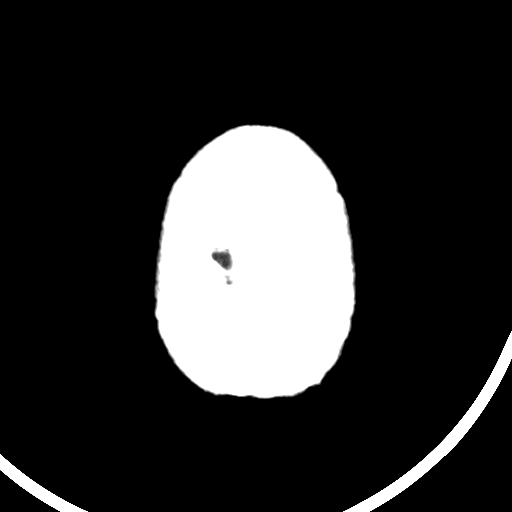

[Series 203: coronal st, idose (1) · coronal · 0.40mm/px · 3 of 68 slices shown]
[im 23/68  brain]
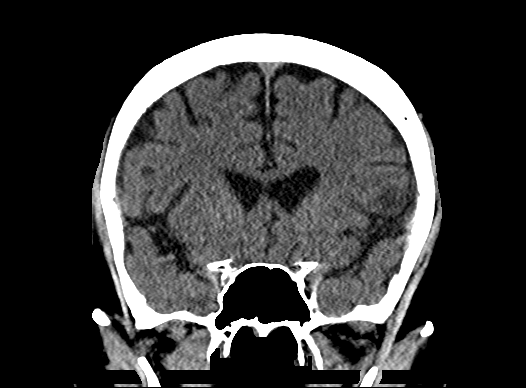
[im 30/68  brain]
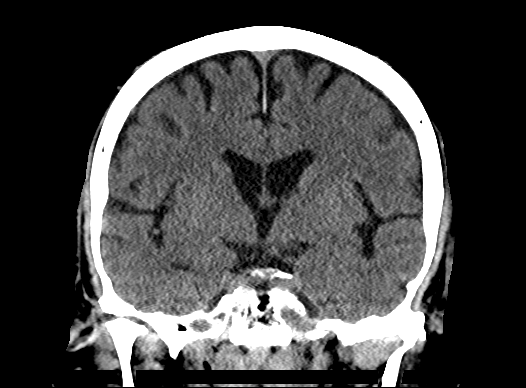
[im 38/68  brain]
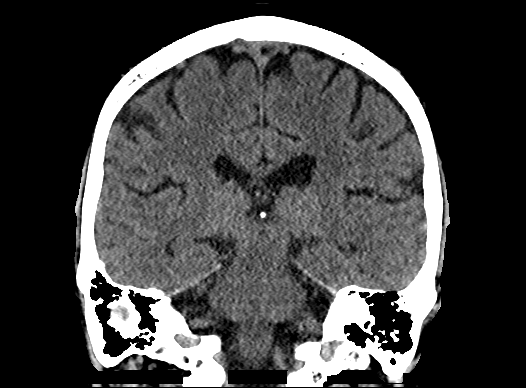

[Series 204: sagittal st, idose (1) · sagittal · 0.40mm/px · 3 of 67 slices shown]
[im 23/67  brain]
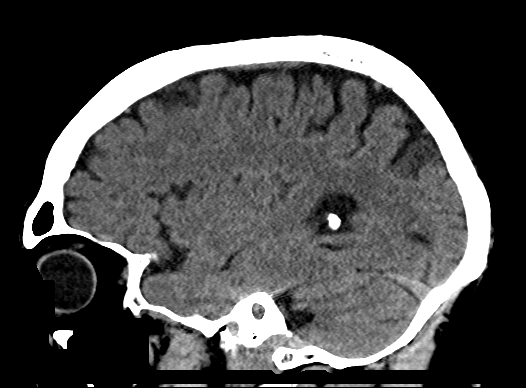
[im 34/67  brain]
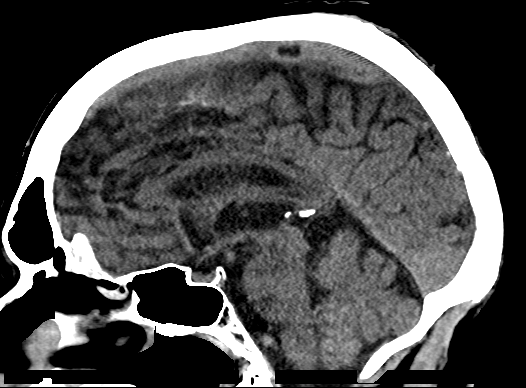
[im 45/67  brain]
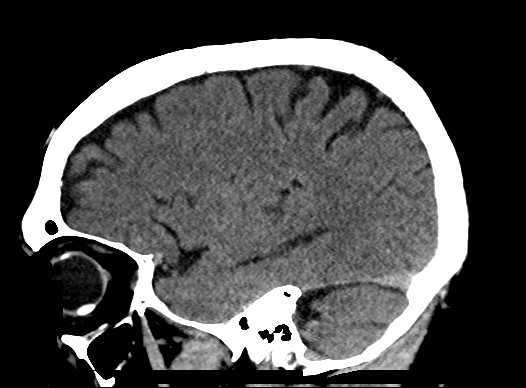

[18 of 47 positions shown; findings below may reference images not displayed]

FINDINGS: There is no evidence of acute cortical infarct, intracranial
hemorrhage, mass, midline shift, or extra-axial fluid collection.
Mild cerebral atrophy is within normal limits for age. A small
chronic right occipital cortical infarct is unchanged.

Prior bilateral cataract extraction and left scleral banding are
again noted. There is calcified atherosclerosis at the skullbase.
The visualized paranasal sinuses and mastoid air cells are clear. No
acute osseous abnormality is identified.

ASPECTS ([place] Stroke Program Early CT Score,
[URL]

- Ganglionic level infarction (caudate, lentiform nuclei, internal
capsule, insula, M1-M3 cortex): 7

- Supraganglionic infarction (M4-M6 cortex): 3

Total score (0-10 with 10 being normal): 10
IMPRESSION: 1. No evidence of acute intracranial abnormality.
2. ASPECTS score 10.
3. Chronic right occipital infarct.
These results were called by telephone at the time of interpretation
on [DATE] at [DATE] to Dr. GANAN, who verbally acknowledged these
results.

## 2016-04-12 MED ORDER — SODIUM CHLORIDE 0.9 % IV SOLN
INTRAVENOUS | Status: DC
  Administered 2016-04-12 – 2016-04-14 (×3): via INTRAVENOUS

## 2016-04-12 MED ORDER — INSULIN ASPART 100 UNIT/ML ~~LOC~~ SOLN: 0.0000 [IU] | Freq: Every day | SUBCUTANEOUS | Status: DC

## 2016-04-12 MED ORDER — ATORVASTATIN CALCIUM 80 MG PO TABS
80.0000 mg | ORAL_TABLET | Freq: Every day | ORAL | Status: DC
  Administered 2016-04-13 – 2016-04-14 (×2): 80 mg via ORAL
  Filled 2016-04-12 (×2): qty 1

## 2016-04-12 MED ORDER — ACETAMINOPHEN 325 MG PO TABS
650.0000 mg | ORAL_TABLET | ORAL | Status: DC | PRN
Start: 2016-04-12 — End: 2016-04-14

## 2016-04-12 MED ORDER — ASPIRIN 300 MG RE SUPP: 300.0000 mg | Freq: Every day | RECTAL | Status: DC

## 2016-04-12 MED ORDER — CLOPIDOGREL BISULFATE 75 MG PO TABS
75.0000 mg | ORAL_TABLET | Freq: Every day | ORAL | Status: DC
  Administered 2016-04-13 – 2016-04-14 (×2): 75 mg via ORAL
  Filled 2016-04-12 (×2): qty 1

## 2016-04-12 MED ORDER — SODIUM CHLORIDE 0.9 % IV BOLUS (SEPSIS)
1000.0000 mL | Freq: Once | INTRAVENOUS | Status: AC
  Administered 2016-04-12: 1000 mL via INTRAVENOUS

## 2016-04-12 MED ORDER — HEPARIN SODIUM (PORCINE) 5000 UNIT/ML IJ SOLN
5000.0000 [IU] | Freq: Three times a day (TID) | INTRAMUSCULAR | Status: DC
  Filled 2016-04-12: qty 1

## 2016-04-12 MED ORDER — INSULIN ASPART 100 UNIT/ML ~~LOC~~ SOLN
0.0000 [IU] | Freq: Three times a day (TID) | SUBCUTANEOUS | Status: DC
  Administered 2016-04-13 (×2): 2 [IU] via SUBCUTANEOUS
  Administered 2016-04-13: 1 [IU] via SUBCUTANEOUS
  Administered 2016-04-14 (×2): 2 [IU] via SUBCUTANEOUS
  Administered 2016-04-14: 1 [IU] via SUBCUTANEOUS

## 2016-04-12 MED ORDER — ACETAMINOPHEN 650 MG RE SUPP
650.0000 mg | RECTAL | Status: DC | PRN
Start: 2016-04-12 — End: 2016-04-14

## 2016-04-12 MED ORDER — ASPIRIN 81 MG PO TABS: 81.0000 mg | ORAL_TABLET | Freq: Every day | ORAL | Status: DC

## 2016-04-12 MED ORDER — SODIUM CHLORIDE 0.9 % IV SOLN
INTRAVENOUS | Status: DC
Start: 2016-04-12 — End: 2016-04-12
  Administered 2016-04-12: 19:00:00 via INTRAVENOUS

## 2016-04-12 MED ORDER — ASPIRIN EC 325 MG PO TBEC
325.0000 mg | DELAYED_RELEASE_TABLET | Freq: Once | ORAL | Status: AC
  Administered 2016-04-13: 325 mg via ORAL

## 2016-04-12 MED ORDER — STROKE: EARLY STAGES OF RECOVERY BOOK
Freq: Once | Status: AC
  Administered 2016-04-12: 20:00:00

## 2016-04-12 MED ORDER — ASPIRIN 325 MG PO TABS
325.0000 mg | ORAL_TABLET | Freq: Every day | ORAL | Status: DC
  Administered 2016-04-13 – 2016-04-14 (×2): 325 mg via ORAL
  Filled 2016-04-12 (×2): qty 1

## 2016-04-12 NOTE — ED Triage Notes (Signed)
PT reports he passed out after he mowed his yard today. Pt reports he went into his house and his wife had him sit down  And he then passed out. Pt wife called EMS . Pt passed out a second time that was seen by EMS staff.

## 2016-04-12 NOTE — Consult Note (Addendum)
Neurology Consultation Reason for Consult: Right-sided weakness Referring Physician: Rai, R  CC: Right-sided weakness  History is obtained from: Patient  HPI: James Blevins is a 70 y.o. male with a history of previous stroke as well as TIAs who presents with recurrent episodes of right-sided weakness and dysarthria. This morning around 10:30 AM, he passed out, his wife called EMS and subsequently passed on the second time. No clear shaking or seizure-like activity. He developed right-sided weakness and slurred speech while he was waiting in the emergency department. He was taken for a stat CT of his head, and by the time he returned he return to baseline. A code stroke was called, however since he had returned to baseline this was changed to TIA alert.  He was doing well until approximately 1 hour ago (around 10 PM) when he began having slurred speech again. He did not notice any right arm weakness this time. His daughter also felt like he was possibly having some trouble getting his words out, though he did not have any difficulty understanding people. This lasted for less than 10 minutes, then he returned to baseline and subsequently a few minutes later had another episode also lasting a little bit less than 10 minutes. This has currently completely resolved.  He also complains of pain mildly unsteady over the past week intermittently.  LKW: 10:30 AM, though currently completely back to baseline. tpa given?: no, resolve symptoms   ROS: A 14 point ROS was performed and is negative except as noted in the HPI.  Past Medical History:  Diagnosis Date  . Cataracts, bilateral   . Diabetes ([place])   . Retinal detachment left eye, 08/2013  . Skin cancer   . Stroke ([place])   . Stroke due to embolism ([place]) 01/14/2016     Family History  Problem Relation Age of Onset  . Clotting disorder Mother     heart attack  . Stroke Maternal Grandfather     stroke  . Colon cancer Neg Hx   . Esophageal  cancer Neg Hx   . Rectal cancer Neg Hx   . Stomach cancer Neg Hx      Social History:  reports that he has never smoked. He has never used smokeless tobacco. He reports that he drinks alcohol. He reports that he does not use drugs.   Exam: Current vital signs: BP (!) 156/74 (BP Location: Right Arm)   Pulse 64   Temp 98.2 F (36.8 C) (Oral)   Resp 18   Ht 5\' 11"  (1.803 m)   Wt 83.1 kg (183 lb 4.8 oz)   SpO2 96%   BMI 25.57 kg/m  Vital signs in last 24 hours: Temp:  [97.8 F (36.6 C)-98.2 F (36.8 C)] 98.2 F (36.8 C) (08/05 1930) Pulse Rate:  [61-79] 64 (08/05 2036) Resp:  [7-27] 18 (08/05 2036) BP: (91-174)/(62-96) 156/74 (08/05 2036) SpO2:  [96 %-100 %] 96 % (08/05 2036) Weight:  [80.3 kg (177 lb)-83.1 kg (183 lb 4.8 oz)] 83.1 kg (183 lb 4.8 oz) (08/05 1930)   Physical Exam  Constitutional: Appears well-developed and well-nourished.  Psych: Affect appropriate to situation Eyes: No scleral injection HENT: No OP obstrucion Head: Normocephalic.  Cardiovascular: Normal rate and regular rhythm.  Respiratory: Effort normal and breath sounds normal to anterior ascultation GI: Soft.  No distension. There is no tenderness.  Skin: WDI  Neuro: Mental Status: Patient is awake, alert, oriented to person, place, month, year, and situation. Patient is able to give  a clear and coherent history. No signs of aphasia or neglect Cranial Nerves: II: Visual Fields are full. Pupils are equal, round, and reactive to light.   III,IV, VI: EOMI without ptosis or diploplia.  V: Facial sensation is symmetric to temperature VII: Facial movement is symmetric.  VIII: hearing is intact to voice X: Uvula elevates symmetrically XI: Shoulder shrug is symmetric. XII: tongue is midline without atrophy or fasciculations.  Motor: Tone is normal. Bulk is normal. 5/5 strength was present in all four extremities.  Sensory: Sensation is symmetric to light touch and temperature in the arms and  legs. Cerebellar: FNF and HKS are intact bilaterally    I have reviewed labs in epic and the results pertinent to this consultation are: CMP-unremarkable other than mildly elevated glucose  I have reviewed the images obtained: MRI brain-no acute infarct, MRA head-multiple areas of intracranial stenosis, though these are much worse on the right than left.   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 One other consideration would be for more proximal stenosis as a cause of his deficits and therefore he will need some imaging of his neck vessels as well. The mid basilar artery also appears stenotic.   MRA can overestimate degree of stenosis, and since I wanted to be a CT angiogram of the neck, I would favor performing a CTA of the head as well to get a better look at the vessels.  This is a syndrome with a very high likelihood of converting to ischemic infarct. If his symptoms do become persistent, then he would be a candidate for IV TPA. It is unclear what the best choice of antithrombotic therapy is in this situation, but I would favor continuing dual antiplatelet therapy and assisting blood pressure with intravenous fluids at this time.  Recommendations: 1. HgbA1c, fasting lipid panel 2. CT angiogram head and neck 3. Frequent neuro checks 4. Echocardiogram 5. Carotid dopplers are not needed given CTA 6. Prophylactic therapy-dual antiplatelet therapy with aspirin and Plavix, he was on baby aspirin at home and so I will give him a stable dose of full dose aspirin now 7. Risk factor modification 8. Telemetry monitoring 9. please page stroke NP  Or  PA  Or MD  M-F from 8am -4 pm starting 8/6 as this patient will be followed by the stroke team at this point.   You can look them up on [email or web address]      Roland Rack,  MD [facility] [phone]  If 7pm- 7am, please page neurology on call as listed in [place].

## 2016-04-12 NOTE — ED Provider Notes (Signed)
[facility] DEPT Provider Note   CSN: [id number] Arrival date & time: 04/12/16  1237  First Provider Contact:  First MD Initiated Contact with Patient 04/12/16 1300        History   Chief Complaint Chief Complaint  Patient presents with  . Loss of Consciousness    HPI James Blevins is a 70 y.o. male.  HPI patient states this morning he walked his usual 1-1/4 miles that he normally does. He states he had to go to work at 2 PM today so he mowed the grass around 10 AM which is later than he normally does it. He rides a riding lawnmower and states he only been mowing about 30 minutes when he started feeling bad. He states he felt like he was "losing it and thought he might pass out.[name] He went back into the house states his gait is staggering. He was sitting on a bench in the house and his wife states he was sweating profusely and then his head started not eating and he passed out. She states he had some drooling and he got extremely pale. They live 4 houses down from the EMS station and they arrived shortly after he passed out. She thought he was unconscious for about 10 minutes. He denies headache, chest pain, shortness of breath. He states yesterday he had some dizziness but not today.  Patient states 08/03/2015 he had 4 strokes and was admitted to the hospital at [facility]. For 1 week. He was followed up by a neurologist in [place]. He states he had no focal deficit due to location of his strokes. He states in April he had an episode of dizziness and headache while watching the [employer] and had to leave the game and go to his family's house. He relates 2 days later he had difficulty speaking and he went back to the [facility] and was readmitted. He states since then he has felt fine until 3 weeks ago. He states he was seen in our ED on July 17 with complaints of headache and was admitted and was diagnosed with dehydration.  PCP Dr Dagmar Hait  Past  Medical History:  Diagnosis Date  . Cataracts, bilateral   . Diabetes ([place])   . Retinal detachment left eye, 08/2013  . Skin cancer   . Stroke ([place])   . Stroke due to embolism ([place]) 01/14/2016    Patient Active Problem List   Diagnosis Date Noted  . TIA (transient ischemic attack) 04/12/2016  . Orthostatic hypotension 03/27/2016  . Dehydration 03/27/2016  . AKI (acute kidney injury) ([place]) 03/27/2016  . Near syncope 03/26/2016  . Stroke due to embolism ([place]) 01/14/2016  . Type 2 diabetes mellitus with retinopathy of both eyes, without long-term current use of insulin ([place]) 01/14/2016  . Essential hypertension 01/14/2016  . Patent foramen ovale 01/14/2016  . History of skin cancer 01/14/2016    Past Surgical History:  Procedure Laterality Date  . COLONOSCOPY  2002  . EYE SURGERY    . skin cancer removal    . TONSILLECTOMY  childhood       Home Medications    Prior to Admission medications   Medication Sig Start Date End Date Taking? Authorizing Provider  aspirin 81 MG tablet Take 81 mg by mouth daily.    Historical Provider, MD  atorvastatin (LIPITOR) 80 MG tablet Take 80 mg by mouth daily.    Historical Provider, MD  clopidogrel (PLAVIX) 75 MG tablet Take 75 mg by  mouth daily.    Historical Provider, MD  ibuprofen (ADVIL,MOTRIN) 200 MG tablet Take 400 mg by mouth every 6 (six) hours as needed for headache, mild pain or moderate pain.    Historical Provider, MD  sitaGLIPtin-metformin (JANUMET) 50-500 MG per tablet Take 1 tablet by mouth 2 (two) times daily with a meal.    Historical Provider, MD    Family History Family History  Problem Relation Age of Onset  . Clotting disorder Mother     heart attack  . Stroke Maternal Grandfather     stroke  . Colon cancer Neg Hx   . Esophageal cancer Neg Hx   . Rectal cancer Neg Hx   . Stomach cancer Neg Hx     Social History Social History  Substance Use Topics  . Smoking status: Never Smoker  . Smokeless tobacco: Never  Used  . 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   Review of Systems Review of Systems  All other systems reviewed and are negative.    Physical Exam Updated Vital Signs BP 138/70 (BP Location: Right Arm)   Pulse 66   Temp 97.8 F (36.6 C) (Oral)   Resp 20   Ht 5\' 11"  (1.803 m)   Wt 177 lb (80.3 kg)   SpO2 98%   BMI 24.69 kg/m   Vital signs normal    Physical Exam  Constitutional: He is oriented to person, place, and time. He appears well-developed and well-nourished.  Non-toxic appearance. He does not appear ill. No distress.  HENT:  Head: Normocephalic and atraumatic.  Right Ear: External ear normal.  Left Ear: External ear normal.  Nose: Nose normal. No mucosal edema or rhinorrhea.  Mouth/Throat: Oropharynx is clear and moist and mucous membranes are normal. No dental abscesses or uvula swelling.  Eyes: Conjunctivae and EOM are normal. Pupils are equal, round, and reactive to light.  Neck: Normal range of motion and full passive range of motion without pain. Neck supple.  Cardiovascular: Normal rate, regular rhythm and normal heart sounds.  Exam reveals no gallop and no friction rub.   No murmur heard. Pulmonary/Chest: Effort normal and breath sounds normal. No respiratory distress. He has no wheezes. He has no rhonchi. He has no rales. He exhibits no tenderness and no crepitus.  Abdominal: Soft. Normal appearance and bowel sounds are normal. He exhibits no distension. There is no tenderness. There is no rebound and no guarding.  Musculoskeletal: Normal range of motion. He exhibits no edema or tenderness.  Moves all extremities well.   Neurological: He is alert and oriented to person, place, and time. He has normal strength. No cranial nerve deficit.  Grips equal, no focal motor deficit noted, finger to nose is intact bilaterally    Skin: Skin is warm, dry and intact. No rash noted. No erythema. No pallor.  Psychiatric: He has a normal mood and affect. His speech is normal and behavior is normal. His mood appears not anxious.  Nursing note and vitals reviewed.    ED Treatments / Results  Labs (all labs ordered are listed, but only abnormal results are displayed)  Results for orders placed or performed during the hospital encounter of 04/12/16  Comprehensive metabolic panel  Result Value Ref Range   Sodium 139 135 - 145 mmol/L   Potassium 4.3 3.5 - 5.1 mmol/L  Chloride 105 101 - 111 mmol/L   CO2 26 22 - 32 mmol/L   Glucose, Bld 209 (H) 65 - 99 mg/dL   BUN 17 6 - 20 mg/dL   Creatinine, Ser 1.06 0.61 - 1.24 mg/dL   Calcium 9.2 8.9 - 10.3 mg/dL   Total Protein 6.1 (L) 6.5 - 8.1 g/dL   Albumin 3.8 3.5 - 5.0 g/dL   AST 24 15 - 41 U/L   ALT 20 17 - 63 U/L   Alkaline Phosphatase 73 38 - 126 U/L   Total Bilirubin 1.0 0.3 - 1.2 mg/dL   GFR calc non Af Amer >60 >60 mL/min   GFR calc Af Amer >60 >60 mL/min   Anion gap 8 5 - 15  CBC with Differential  Result Value Ref Range   WBC 6.9 4.0 - 10.5 K/uL   RBC 4.77 4.22 - 5.81 MIL/uL   Hemoglobin 14.7 13.0 - 17.0 g/dL   HCT 42.9 39.0 - 52.0 %   MCV 89.9 78.0 - 100.0 fL   MCH 30.8 26.0 - 34.0 pg   MCHC 34.3 30.0 - 36.0 g/dL   RDW 12.7 11.5 - 15.5 %   Platelets 147 (L) 150 - 400 K/uL   Neutrophils Relative % 78 %   Neutro Abs 5.4 1.7 - 7.7 K/uL   Lymphocytes Relative 12 %   Lymphs Abs 0.8 0.7 - 4.0 K/uL   Monocytes Relative 8 %   Monocytes Absolute 0.5 0.1 - 1.0 K/uL   Eosinophils Relative 2 %   Eosinophils Absolute 0.1 0.0 - 0.7 K/uL   Basophils Relative 0 %   Basophils Absolute 0.0 0.0 - 0.1 K/uL  CK  Result Value Ref Range   Total CK 90 49 - 397 U/L  CBC  Result Value Ref Range   WBC 7.5 4.0 - 10.5 K/uL   RBC 4.72 4.22 - 5.81 MIL/uL   Hemoglobin 14.3 13.0 - 17.0 g/dL   HCT 42.3 39.0 - 52.0 %   MCV 89.6 78.0 - 100.0 fL   MCH 30.3 26.0 - 34.0 pg    MCHC 33.8 30.0 - 36.0 g/dL   RDW 12.8 11.5 - 15.5 %   Platelets 149 (L) 150 - 400 K/uL  Differential  Result Value Ref Range   Neutrophils Relative % 80 %   Neutro Abs 6.0 1.7 - 7.7 K/uL   Lymphocytes Relative 12 %   Lymphs Abs 0.9 0.7 - 4.0 K/uL   Monocytes Relative 7 %   Monocytes Absolute 0.5 0.1 - 1.0 K/uL   Eosinophils Relative 1 %   Eosinophils Absolute 0.0 0.0 - 0.7 K/uL   Basophils Relative 0 %   Basophils Absolute 0.0 0.0 - 0.1 K/uL  I-stat troponin, ED  Result Value Ref Range   Troponin i, poc 0.00 0.00 - 0.08 ng/mL   Comment 3          I-stat troponin, ED  Result Value Ref Range   Troponin i, poc 0.00 0.00 - 0.08 ng/mL   Comment 3          I-Stat Chem 8, ED  (not at [facility])  Result Value Ref Range   Sodium 142 135 - 145 mmol/L   Potassium 4.3 3.5 - 5.1 mmol/L   Chloride 103 101 - 111 mmol/L   BUN 19 6 - 20 mg/dL   Creatinine, Ser 0.80 0.61 - 1.24 mg/dL   Glucose, Bld 164 (H) 65 - 99 mg/dL   Calcium, Ion 1.08 (L)  1.12 - 1.23 mmol/L   TCO2 26 0 - 100 mmol/L   Hemoglobin 13.6 13.0 - 17.0 g/dL   HCT 40.0 39.0 - 52.0 %   Laboratory interpretation all normal except for hyperglycemia      EKG  EKG Interpretation  Date/Time:  Saturday April 12 2016 12:40:06 EDT Ventricular Rate:  67 PR Interval:    QRS Duration: 101 QT Interval:  392 QTC Calculation: 414 R Axis:   71 Text Interpretation:  Sinus rhythm Minimal ST depression, inferior leads No significant change since last tracing 26 Mar 2016 Confirmed by Laurenashley Viar  MD-I, Connell Bognar ([id number]) on 04/12/2016 12:57:57 PM       Radiology Ct Head Code Stroke W/o Cm  Result Date: 04/12/2016 CLINICAL DATA:  Code stroke. New onset slurred speech and right arm weakness. EXAM: CT HEAD WITHOUT CONTRAST TECHNIQUE: Contiguous axial images were obtained from the base of the skull through the vertex without intravenous contrast. COMPARISON:  03/26/2016 FINDINGS: There is no evidence of acute cortical infarct, intracranial  hemorrhage, mass, midline shift, or extra-axial fluid collection. Mild cerebral atrophy is within normal limits for age. A small chronic right occipital cortical infarct is unchanged. Prior bilateral cataract extraction and left scleral banding are again noted. There is calcified atherosclerosis at the skullbase. The visualized paranasal sinuses and mastoid air cells are clear. No acute osseous abnormality is identified. ASPECTS [facility] Stroke Program Early CT Score, http://www.aspectsinstroke.com) - Ganglionic level infarction (caudate, lentiform nuclei, internal capsule, insula, M1-M3 cortex): 7 - Supraganglionic infarction (M4-M6 cortex): 3 Total score (0-10 with 10 being normal): 10 IMPRESSION: 1. No evidence of acute intracranial abnormality. 2. ASPECTS score 10. 3. Chronic right occipital infarct. These results were called by telephone at the time of interpretation on 04/12/2016 at 4:13 pm to Dr. Shon Hale, who verbally acknowledged these results. Electronically Signed   By: Logan Bores M.D.   On: 04/12/2016 16:15    Procedures Procedures (including critical care time)  Medications Ordered in ED Medications  sodium chloride 0.9 % bolus 1,000 mL (0 mLs Intravenous Stopped 04/12/16 1514)     Initial Impression / Assessment and Plan / ED Course  I have reviewed the triage vital signs and the nursing notes.  Pertinent labs & imaging results that were available during my care of the patient were reviewed by me and considered in my medical decision making (see chart for details).  Clinical Course   Patient was given IV fluids due to his diaphoresis and syncope at home.  Patient had delta troponins done due to his syncope.  15:50 PM pt now has pronator drift on the RUE, slurred speech,, right facial droop Code Stroke called. CT of the head was ordered.  16:00 patient returned from CT of head, he is now back to normal.   16:44 PM Dr Tana Coast, hospitalist will admit to tele, observation, she does not  want him to go to MRI yet. Still waiting For the neurologist to evaluate the patient for the code stroke, the rapid response nurse has evaluated the patient.  16:54 Per rapid response nurse, Dr Shon Hale did not get the Code Stroke page. He will see patient.   Final Clinical Impressions(s) / ED Diagnoses   Final diagnoses:  Syncope, unspecified syncope type  Transient cerebral ischemia, unspecified transient cerebral ischemia type   Plan admission  Rolland Porter, MD, Barbette Or, MD 04/12/16 [phone]

## 2016-04-12 NOTE — ED Notes (Signed)
Pt to MRI at this time.

## 2016-04-12 NOTE — ED Notes (Signed)
Pt back from CT at this time 

## 2016-04-12 NOTE — H&P (Signed)
History and Physical        Hospital Admission Note Date: 04/12/2016  Patient name: James Blevins Medical record number: [id number] Date of birth: Sep 17, 1945 Age: 70 y.o. Gender: male  PCP: Tivis Ringer, MD   Referring physician: Dr Tomi Bamberger  Patient coming from: home    Chief Complaint:  Pased out at home, subsequently had slurred speech and right-sided weakness in the ER  HPI: Patient is a 70 year old male with history of type 2 diabetes mellitus, recurrent TIA hypertension, hyperlipidemia presented to ED after he passed out at home. History was obtained from the patient and his wife, son in the room. Patient reported that he was mowing his yard today around 10:30 AM, went back into the house when his wife found him staggering, clammy and diaphoretic. He sat down and then subsequently passed out. Patient's wife called the EMS, subsequently he passed out a second time that was seen by EMS staff. Patient's BP was in 130s (which per patient is lower for him). He denied any chest pain, palpitations prior to the episode. No seizure-like activity was seen by the wife or EMS staff. While waiting in ED, around 3:50 PM, patient was noticed to have right upper extremity weakness, right facial droop and slurred speech. This was witnessed by the ED staff. Code stroke was called and patient underwent CT head. Once he returned from the CT head after 10 minutes, he was back to his baseline. Rapid response was called and neurology was consulted. [facility] service was requested for admission. Per patient he had similar presentation in November 2016 when he had multiple strokes and syncopal episode and was admitted in [place] for one week. Patient was recently discharged from the hospital on 7/20 with near syncopal episode and was found to be orthostatic, dehydration and acute kidney  injury. ED work-up/course:  CT head negative for acute CVA, CBC, BMET unremarkable. Troponin less than 0.0, EKG showed normal sinus rhythm, no acute ST-T wave changes suggestive of ischemia.  Review of Systems: Positives marked in 'bold' Constitutional: Denies fever, chills, diaphoresis, poor appetite and fatigue.  HEENT: Denies photophobia, eye pain, redness, hearing loss, ear pain, congestion, sore throat, rhinorrhea, sneezing, mouth sores, trouble swallowing, neck pain, neck stiffness and tinnitus.   Respiratory: Denies SOB, DOE, cough, chest tightness,  and wheezing.   Cardiovascular: Denies chest pain, palpitations and leg swelling.  Gastrointestinal: Denies nausea, vomiting, abdominal pain, diarrhea, constipation, blood in stool and abdominal distention.  Genitourinary: Denies dysuria, urgency, frequency, hematuria, flank pain and difficulty urinating.  Musculoskeletal: Denies myalgias, back pain, joint swelling, arthralgias and gait problem.  Skin: Denies pallor, rash and wound.  Neurological: Please see history of present illness   Hematological: Denies adenopathy. Easy bruising, personal or family bleeding history  Psychiatric/Behavioral: Denies suicidal ideation, mood changes, confusion, nervousness, sleep disturbance and agitation  Past Medical History: Past Medical History:  Diagnosis Date  . Cataracts, bilateral   . Diabetes ([place])   . Retinal detachment left eye, 08/2013  . Skin cancer   . Stroke ([place])   . Stroke due to embolism ([place]) 01/14/2016    Past Surgical History:  Procedure Laterality Date  . COLONOSCOPY  2002  . EYE SURGERY    . skin cancer removal    . TONSILLECTOMY  childhood    Medications: Prior to Admission medications   Medication Sig Start Date End Date Taking? Authorizing Provider  aspirin 81 MG tablet Take 81 mg by mouth daily.   Yes Historical Provider, MD  atorvastatin (LIPITOR) 80 MG tablet Take 80 mg by mouth daily.   Yes Historical Provider,  MD  clopidogrel (PLAVIX) 75 MG tablet Take 75 mg by mouth daily.   Yes Historical Provider, MD  sitaGLIPtin-metformin (JANUMET) 50-1000 MG tablet Take 1 tablet by mouth 2 (two) times daily with a meal.   Yes Historical Provider, MD    Allergies:  No Known Allergies  Social History:  reports that he has never smoked. He has never used smokeless tobacco. He reports that he drinks alcohol. He reports that he does not use drugs.He currently lives at home with his wife and is functional with his ADLs.  Family History: Family History  Problem Relation Age of Onset  . Clotting disorder Mother     heart attack  . Stroke Maternal Grandfather     stroke  . Colon cancer Neg Hx   . Esophageal cancer Neg Hx   . Rectal cancer Neg Hx   . Stomach cancer Neg Hx     Physical Exam: Blood pressure 91/73, pulse 72, temperature 97.8 F (36.6 C), temperature source Oral, resp. rate 19, height 5\' 11"  (1.803 m), weight 80.3 kg (177 lb), SpO2 99 %. General: Alert, awake, oriented x3, in no acute distress. HEENT: normocephalic, atraumatic, anicteric sclera, pink conjunctiva, pupils equal and reactive to light and accomodation, oropharynx clear Neck: supple, no masses or lymphadenopathy, no goiter, no bruits  Heart: Regular rate and rhythm, without murmurs, rubs or gallops. Lungs: Clear to auscultation bilaterally, no wheezing, rales or rhonchi. Abdomen: Soft, nontender, nondistended, positive bowel sounds, no masses. Extremities: No clubbing, cyanosis or edema with positive pedal pulses. Neuro: Grossly intact, no focal neurological deficits, strength 5/5 upper and lower extremities bilaterally Psych: alert and oriented x 3, normal mood and affect Skin: no rashes or lesions, warm and dry   LABS on Admission:  Basic Metabolic Panel:  Recent Labs Lab 04/12/16 1319 04/12/16 1602 04/12/16 1614  NA 139 140 142  K 4.3 4.4 4.3  CL 105 107 103  CO2 26 27  --   GLUCOSE 209* 166* 164*  BUN 17 17 19     CREATININE 1.06 0.90 0.80  CALCIUM 9.2 8.8*  --    Liver Function Tests:  Recent Labs Lab 04/12/16 1319 04/12/16 1602  AST 24 21  ALT 20 21  ALKPHOS 73 70  BILITOT 1.0 0.9  PROT 6.1* 6.0*  ALBUMIN 3.8 3.6   No results for input(s): LIPASE, AMYLASE in the last 168 hours. No results for input(s): AMMONIA in the last 168 hours. CBC:  Recent Labs Lab 04/12/16 1319 04/12/16 1602 04/12/16 1614  WBC 6.9 7.5  --   NEUTROABS 5.4 6.0  --   HGB 14.7 14.3 13.6  HCT 42.9 42.3 40.0  MCV 89.9 89.6  --   PLT 147* 149*  --    Cardiac Enzymes:  Recent Labs Lab 04/12/16 1319  CKTOTAL 90   BNP: Invalid input(s): POCBNP CBG: No results for input(s): GLUCAP in the last 168 hours.  Radiological Exams on Admission:  No results found.  *I have personally reviewed the images above*  EKG: Independently reviewed.Rate 67, QTC 414 Normal sinus rhythm,  no acute ST-T wave changes just above ischemia   Assessment/Plan Principal Problem:   TIA (transient ischemic attack) With prior history of CVA/basal artery stenosis, CVA in November 2016 without any residual deficits. Symptoms have resolved, not a TPA candidate - neurological deficits were witnessed in ED, code stroke was called, awaiting neurology recommendations - Continue IV fluids, continue aspirin and Plavix, MRI of the brain, MRA ordered - Obtain 2-D echo, carotid Dopplers, hemoglobin A1c, lipid panel - NPO, swallow evaluation  Active Problems:   Type 2 diabetes mellitus with retinopathy of both eyes, without long-term current use of insulin (HCC) - Obtain hemoglobin A1c, place on sliding scale insulin while inpatient. Hold Janumet    Essential hypertension -  will place on IV fluids    Dehydration, Syncope - Admit on telemetry, no arrhythmias noted on the EKG, obtain 2-D echo for further workup - Serial troponins x3  Hyperlipidemia - Obtain lipid panel, continue statin  DVT prophylaxis: Heparin subcutaneous  CODE  STATUS: Full code discussed with the patient  Consults called: Neurology has been consulted by EDP  Family Communication: Admission, patients condition and plan of care including tests being ordered have been discussed with the patient and  Son, wife who indicates understanding and agree with the plan and Code Status  Admission status: Observation telemetry  Disposition plan: Further plan will depend as patient's clinical course evolves and further radiologic and laboratory data become available. Likely home when workup is complete   Time Spent on Admission: 4mins   Ahnesti Townsend M.D. [facility] 04/12/2016, 5:45 PM Pager: [phone]  If 7PM-7AM, please contact night-coverage [email or web address] Password [email or web address]

## 2016-04-13 ENCOUNTER — Encounter (HOSPITAL_COMMUNITY): Payer: Medicare Other

## 2016-04-13 ENCOUNTER — Observation Stay (HOSPITAL_BASED_OUTPATIENT_CLINIC_OR_DEPARTMENT_OTHER): Payer: Medicare Other

## 2016-04-13 ENCOUNTER — Encounter (HOSPITAL_COMMUNITY): Payer: Self-pay | Admitting: Internal Medicine

## 2016-04-13 ENCOUNTER — Observation Stay (HOSPITAL_COMMUNITY): Payer: Medicare Other

## 2016-04-13 DIAGNOSIS — I6521 Occlusion and stenosis of right carotid artery: Secondary | ICD-10-CM | POA: Diagnosis not present

## 2016-04-13 DIAGNOSIS — G459 Transient cerebral ischemic attack, unspecified: Secondary | ICD-10-CM

## 2016-04-13 DIAGNOSIS — E113593 Type 2 diabetes mellitus with proliferative diabetic retinopathy without macular edema, bilateral: Secondary | ICD-10-CM | POA: Diagnosis not present

## 2016-04-13 DIAGNOSIS — I10 Essential (primary) hypertension: Secondary | ICD-10-CM | POA: Diagnosis not present

## 2016-04-13 DIAGNOSIS — E860 Dehydration: Secondary | ICD-10-CM | POA: Diagnosis not present

## 2016-04-13 DIAGNOSIS — R55 Syncope and collapse: Secondary | ICD-10-CM | POA: Diagnosis not present

## 2016-04-13 LAB — URINALYSIS, ROUTINE W REFLEX MICROSCOPIC
Bilirubin Urine: NEGATIVE
Glucose, UA: 500 mg/dL — AB
Hgb urine dipstick: NEGATIVE
Ketones, ur: 15 mg/dL — AB
LEUKOCYTES UA: NEGATIVE
NITRITE: NEGATIVE
PH: 7 (ref 5.0–8.0)
Protein, ur: NEGATIVE mg/dL
SPECIFIC GRAVITY, URINE: 1.025 (ref 1.005–1.030)

## 2016-04-13 LAB — RAPID URINE DRUG SCREEN, HOSP PERFORMED
AMPHETAMINES: NOT DETECTED
Barbiturates: NOT DETECTED
Benzodiazepines: NOT DETECTED
Cocaine: NOT DETECTED
OPIATES: NOT DETECTED
Tetrahydrocannabinol: NOT DETECTED

## 2016-04-13 LAB — ECHOCARDIOGRAM COMPLETE
HEIGHTINCHES: 71 in
WEIGHTICAEL: 2932.8 [oz_av]

## 2016-04-13 LAB — LIPID PANEL
Cholesterol: 97 mg/dL (ref 0–200)
HDL: 26 mg/dL — AB (ref >40)
LDL CALC: 55 mg/dL (ref 0–99)
Total CHOL/HDL Ratio: 3.7 RATIO
Triglycerides: 80 mg/dL (ref <150)
VLDL: 16 mg/dL (ref 0–40)

## 2016-04-13 LAB — GLUCOSE, CAPILLARY
GLUCOSE-CAPILLARY: 137 mg/dL — AB (ref 65–99)
GLUCOSE-CAPILLARY: 163 mg/dL — AB (ref 65–99)
Glucose-Capillary: 145 mg/dL — ABNORMAL HIGH (ref 65–99)
Glucose-Capillary: 154 mg/dL — ABNORMAL HIGH (ref 65–99)

## 2016-04-13 MED ORDER — PERFLUTREN LIPID MICROSPHERE
1.0000 mL | INTRAVENOUS | Status: AC | PRN
  Administered 2016-04-13: 2 mL via INTRAVENOUS
  Filled 2016-04-13: qty 10

## 2016-04-13 MED ORDER — SODIUM CHLORIDE 0.9 % IV BOLUS (SEPSIS)
500.0000 mL | Freq: Once | INTRAVENOUS | Status: AC
  Administered 2016-04-12: 500 mL via INTRAVENOUS

## 2016-04-13 MED ORDER — HEPARIN SODIUM (PORCINE) 5000 UNIT/ML IJ SOLN
5000.0000 [IU] | Freq: Three times a day (TID) | INTRAMUSCULAR | Status: DC
  Administered 2016-04-13 – 2016-04-14 (×3): 5000 [IU] via SUBCUTANEOUS
  Filled 2016-04-13 (×3): qty 1

## 2016-04-13 MED ORDER — IOPAMIDOL (ISOVUE-370) INJECTION 76%
INTRAVENOUS | Status: AC
  Administered 2016-04-13: 50 mL
  Filled 2016-04-13: qty 50

## 2016-04-13 NOTE — Progress Notes (Signed)
  Echocardiogram 2D Echocardiogram has been performed with definity.  Aggie Cosier 04/13/2016, 12:59 PM

## 2016-04-13 NOTE — Progress Notes (Signed)
Occupational Therapy Discharge Patient Details Name: James Blevins MRN: [id number] DOB: 06/22/1946 Today's Date: 04/13/2016 Time:  -     Patient discharged from OT services secondary to OT screen ( spoke with PT Ashly.  Please see latest therapy progress note for current level of functioning and progress toward goals.    Progress and discharge plan discussed with patient and/or caregiver: Patient/Caregiver agrees with plan  GO     James Blevins   OTR/L Pager: [phone] Office: [phone] .  04/13/2016, 8:58 AM

## 2016-04-13 NOTE — Progress Notes (Signed)
STROKE TEAM PROGRESS NOTE   HISTORY OF PRESENT ILLNESS (per record) James Blevins is a 70 y.o. male with a history of previous stroke as well as TIAs who presents with recurrent episodes of right-sided weakness and dysarthria. This morning around 10:30 AM, he passed out, his wife called EMS and subsequently passed on the second time. No clear shaking or seizure-like activity. He developed right-sided weakness and slurred speech while he was waiting in the emergency department. He was taken for a stat CT of his head, and by the time he returned he was back at baseline. A code stroke was called, however since he had returned to baseline this was changed to TIA alert.    SUBJECTIVE (INTERVAL HISTORY) No acute events overnight. Has not had any more attacks since last night    OBJECTIVE Temp:  [97.8 F (36.6 C)-98.2 F (36.8 C)] 98.2 F (36.8 C) (08/05 1930) Pulse Rate:  [61-79] 71 (08/06 0400) Cardiac Rhythm: Normal sinus rhythm (08/06 0700) Resp:  [7-27] 20 (08/06 0000) BP: (91-174)/(62-96) 158/85 (08/06 0400) SpO2:  [96 %-100 %] 96 % (08/05 2036) Weight:  [80.3 kg (177 lb)-83.1 kg (183 lb 4.8 oz)] 83.1 kg (183 lb 4.8 oz) (08/05 1930)  CBC:  Recent Labs Lab 04/12/16 1319 04/12/16 1602 04/12/16 1614  WBC 6.9 7.5  --   NEUTROABS 5.4 6.0  --   HGB 14.7 14.3 13.6  HCT 42.9 42.3 40.0  MCV 89.9 89.6  --   PLT 147* 149*  --     Basic Metabolic Panel:  Recent Labs Lab 04/12/16 1319 04/12/16 1602 04/12/16 1614  NA 139 140 142  K 4.3 4.4 4.3  CL 105 107 103  CO2 26 27  --   GLUCOSE 209* 166* 164*  BUN 17 17 19   CREATININE 1.06 0.90 0.80  CALCIUM 9.2 8.8*  --     Lipid Panel:    Component Value Date/Time   CHOL 97 04/13/2016 0735   TRIG 80 04/13/2016 0735   HDL 26 (L) 04/13/2016 0735   CHOLHDL 3.7 04/13/2016 0735   VLDL 16 04/13/2016 0735   LDLCALC 55 04/13/2016 0735   HgbA1c: No results found for: HGBA1C Urine Drug Screen:    Component Value Date/Time   LABOPIA  NONE DETECTED 04/13/2016 0027   COCAINSCRNUR NONE DETECTED 04/13/2016 0027   LABBENZ NONE DETECTED 04/13/2016 0027   AMPHETMU NONE DETECTED 04/13/2016 0027   THCU NONE DETECTED 04/13/2016 0027   LABBARB NONE DETECTED 04/13/2016 0027      IMAGING   Ct Angio Neck and Neck W Or Wo Contrast 04/13/2016  CTA NECK:  Mild atherosclerosis without hemodynamically significant stenosis or acute vascular process.   CTA HEAD:   No emergent large vessel occlusion. Multifocal moderate and severe stenosis of the anterior and posterior circulation including severe RIGHT supraclinoid internal carotid artery and severe mid basilar artery.    Mr Jodene Nam Head Wo Contrast 04/12/2016 1. No acute infarct.  2. Remote bilateral occipital lobe infarcts.  3. Mild atrophy and white matter disease is otherwise within normal limits for age.  4. Moderate to high-grade stenoses of the proximal M1 and A1 segments.  5. Severe mid basilar stenosis.  6. Moderate segmental narrowing within the posterior cerebral arteries bilaterally.    Ct Head Code Stroke W/o Cm 04/12/2016 1. No evidence of acute intracranial abnormality.  2. ASPECTS score 10.  3. Chronic right occipital infarct.      PHYSICAL EXAM  Physical Exam  Constitutional: Appears  well-developed and well-nourished.  Psych: Affect appropriate to situation Eyes: No scleral injection HENT: No OP obstrucion Head: Normocephalic.  Cardiovascular: Normal rate and regular rhythm.  Respiratory: Effort normal and breath sounds normal to anterior ascultation GI: Soft.  No distension. There is no tenderness.  Skin: WDI  Neuro: Mental Status: Patient is awake, alert, oriented to person, place, month, year, and situation. Patient is able to give a clear and coherent history. No signs of aphasia or neglect Cranial Nerves: II: Visual Fields are full. Pupils are equal, round, and reactive to light.   III,IV, VI: EOMI without ptosis or diploplia.  V: Facial  sensation is symmetric to temperature VII: Facial movement is symmetric.  VIII: hearing is intact to voice X: Uvula elevates symmetrically XI: Shoulder shrug is symmetric. XII: tongue is midline without atrophy or fasciculations.  Motor: Tone is normal. Bulk is normal. 5/5 strength was present in all four extremities.  Sensory: Sensation is symmetric to light touch and temperature in the arms and legs. Cerebellar: FNF and HKS are intact bilaterally     ASSESSMENT/PLAN James Blevins is a 70 y.o. male with history of previous strokes and diabetes mellitus presenting with right hemiparesis and dysarthria. He did not receive IV t-PA due to resolution of deficits.  Possible TIA:  Dominant   Resultant - resolution of deficits.  MRI  No acute infarct. Remote bilateral occipital lobe infarcts.  MRA  Moderate to high-grade stenoses of the proximal M1 and A1 segments. Severe mid basilar stenosis.   Carotid Doppler - refer to CTA of the neck.  CTA neck - Multifocal moderate and severe stenosis of the anterior and posterior circulation including  severe RIGHT supraclinoid internal carotid artery and severe mid basilar artery.   2D Echo - pending  LDL 55  HgbA1c pending  VTE prophylaxis - subcutaneous heparin  Diet Carb Modified Fluid consistency: Thin; Room service appropriate? Yes  aspirin 81 mg daily and clopidogrel 75 mg daily prior to admission, now on aspirin 325 mg daily and clopidogrel 75 mg daily  Patient counseled to be compliant with his antithrombotic medications  Ongoing aggressive stroke risk factor management  Therapy recommendations: No further physical or occupational therapy recommended  Disposition: Pending  Hypertension  Mildly high blood pressures  Permissive hypertension (OK if < 220/120) but gradually normalize in 5-7 days  Long-term BP goal normotensive  Hyperlipidemia  Home meds: Lipitor 80 mg daily resumed in hospital  LDL 55, goal <  70  Continue statin at discharge  Diabetes  HgbA1c pending, goal < 7.0  Uncontrolled  Other Stroke Risk Factors  Advanced age  ETOH use, advised to drink no more than 1 - 2 drink(s) a day  Hx stroke/TIA  Family hx stroke (maternal grandmother)   Other Active Problems  Mild thrombocytopenia   PLAN  Continue dual antiplatelet therapy for 3 months then Plavix alone.  ? Candidate for R ICA stent. Discuss with Dr Leonie Man.  Hospital day # 0  - Continue dual antiplatelet therapy - may need to be discussed with interventional     To contact Stroke Continuity provider, please refer to [email or web address] After hours, contact General Neurology

## 2016-04-13 NOTE — Progress Notes (Signed)
PROGRESS NOTE    James Blevins  [id number] DOB: [birth year] DOA: 04/12/2016 PCP: Tivis Ringer, MD   Outpatient Specialists:     Brief Narrative:  70 year old male with history of type 2 diabetes mellitus, recurrent TIA hypertension, hyperlipidemia presented to ED after he passed out at home. History was obtained from the patient and his wife, son in the room. Patient reported that he was mowing his yard today around 10:30 AM, went back into the house when his wife found him staggering, clammy and diaphoretic. He sat down and then subsequently passed out. Patient's wife called the EMS, subsequently he passed out a second time that was seen by EMS staff. Patient's BP was in 130s (which per patient is lower for him). In the ED, around 3:50 PM, patient was noticed to have right upper extremity weakness, right facial droop and slurred speech. This was witnessed by the ED staff. Code stroke was called and patient underwent CT head. Once he returned from the CT head after 10 minutes, he was back to his baseline.  Assessment & Plan:   Principal Problem:   TIA (transient ischemic attack) Active Problems:   Type 2 diabetes mellitus with retinopathy of both eyes, without long-term current use of insulin (HCC)   Essential hypertension   Dehydration   Syncope   TIA (transient ischemic attack)  Symptoms have resolved so not a TPA candidate - neurology consult -ASA (full) and plavix -MRI negative for new CVA -CTA of neck/head shows: Multifocal moderate and severe stenosis of the anterior and posterior circulation including severe RIGHT supraclinoid internal carotid artery and severe mid basilar artery. -HgbA1C pending -FLP: LDL 55- on statin    Type 2 diabetes mellitus with retinopathy of both eyes, without long-term current use of insulin (HCC) - Obtain hemoglobin A1c, place on sliding scale insulin while inpatient. Hold Janumet     Dehydration, Syncope - tele, echo - CE  negative -IVF  Hyperlipidemia -LDL: 55   DVT prophylaxis:  SQ Heparin  Code Status: Full Code   Family Communication: patient  Disposition Plan:     Consultants:   neurology  Procedures:        Subjective: No current issues Did have several overnight periods of word finding difficulties  Objective: Vitals:   04/12/16 2200 04/13/16 0000 04/13/16 0200 04/13/16 0400  BP: (!) 159/76 (!) 155/84 (!) 154/68 (!) 158/85  Pulse: 72 69 72 71  Resp: 16 20    Temp:      TempSrc:      SpO2:      Weight:      Height:        Intake/Output Summary (Last 24 hours) at 04/13/16 1208 Last data filed at 04/13/16 0100  Gross per 24 hour  Intake              500 ml  Output              450 ml  Net               50 ml   Filed Weights   04/12/16 1243 04/12/16 1930  Weight: 80.3 kg (177 lb) 83.1 kg (183 lb 4.8 oz)    Examination:  General exam: Appears calm and comfortable  Respiratory system: Clear to auscultation. Respiratory effort normal. Cardiovascular system: S1 & S2 heard, RRR. No JVD, murmurs, rubs, gallops or clicks. No pedal edema. Gastrointestinal system: Abdomen is nondistended, soft and nontender. No organomegaly or masses felt. Normal bowel sounds  heard. Central nervous system: Alert and oriented. No focal neurological deficits. Extremities: Symmetric 5 x 5 power. Skin: No rashes, lesions or ulcers Psychiatry: Judgement and insight appear normal. Mood & affect appropriate.     Data Reviewed: I have personally reviewed following labs and imaging studies  CBC:  Recent Labs Lab 04/12/16 1319 04/12/16 1602 04/12/16 1614  WBC 6.9 7.5  --   NEUTROABS 5.4 6.0  --   HGB 14.7 14.3 13.6  HCT 42.9 42.3 40.0  MCV 89.9 89.6  --   PLT 147* 149*  --    Basic Metabolic Panel:  Recent Labs Lab 04/12/16 1319 04/12/16 1602 04/12/16 1614  NA 139 140 142  K 4.3 4.4 4.3  CL 105 107 103  CO2 26 27  --   GLUCOSE 209* 166* 164*  BUN 17 17 19     CREATININE 1.06 0.90 0.80  CALCIUM 9.2 8.8*  --    GFR: Estimated Creatinine Clearance: 88.9 mL/min (by C-G formula based on SCr of 0.8 mg/dL). Liver Function Tests:  Recent Labs Lab 04/12/16 1319 04/12/16 1602  AST 24 21  ALT 20 21  ALKPHOS 73 70  BILITOT 1.0 0.9  PROT 6.1* 6.0*  ALBUMIN 3.8 3.6   No results for input(s): LIPASE, AMYLASE in the last 168 hours. No results for input(s): AMMONIA in the last 168 hours. Coagulation Profile:  Recent Labs Lab 04/12/16 1605  INR 1.10   Cardiac Enzymes:  Recent Labs Lab 04/12/16 1319  CKTOTAL 90   BNP (last 3 results) No results for input(s): PROBNP in the last 8760 hours. HbA1C: No results for input(s): HGBA1C in the last 72 hours. CBG:  Recent Labs Lab 04/12/16 2210 04/13/16 0712 04/13/16 1105  GLUCAP 144* 137* 163*   Lipid Profile:  Recent Labs  04/13/16 0735  CHOL 97  HDL 26*  LDLCALC 55  TRIG 80  CHOLHDL 3.7   Thyroid Function Tests: No results for input(s): TSH, T4TOTAL, FREET4, T3FREE, THYROIDAB in the last 72 hours. Anemia Panel: No results for input(s): VITAMINB12, FOLATE, FERRITIN, TIBC, IRON, RETICCTPCT in the last 72 hours. Urine analysis:    Component Value Date/Time   COLORURINE YELLOW 04/13/2016 0027   APPEARANCEUR CLEAR 04/13/2016 0027   LABSPEC 1.025 04/13/2016 0027   PHURINE 7.0 04/13/2016 0027   GLUCOSEU 500 (A) 04/13/2016 0027   HGBUR NEGATIVE 04/13/2016 0027   BILIRUBINUR NEGATIVE 04/13/2016 0027   KETONESUR 15 (A) 04/13/2016 0027   PROTEINUR NEGATIVE 04/13/2016 0027   NITRITE NEGATIVE 04/13/2016 0027   LEUKOCYTESUR NEGATIVE 04/13/2016 0027     )No results found for this or any previous visit (from the past 240 hour(s)).    Anti-infectives    None       Radiology Studies: Ct Angio Head W Or Wo Contrast  Result Date: 04/13/2016 CLINICAL DATA:  Feeling unwell, diaphoretic, syncopal episode, 10 minutes loss of consciousness. Intermittent slurred speech. Assess  stroke, follow-up abnormal MRA. History of diabetes. EXAM: CT ANGIOGRAPHY HEAD AND NECK TECHNIQUE: Multidetector CT imaging of the head and neck was performed using the standard protocol during bolus administration of intravenous contrast. Multiplanar CT image reconstructions and MIPs were obtained to evaluate the vascular anatomy. Carotid stenosis measurements (when applicable) are obtained utilizing NASCET criteria, using the distal internal carotid diameter as the denominator. CONTRAST:  50 cc Isovue 370 COMPARISON:  MRI and MRA head August 5th 2017. FINDINGS: CTA NECK AORTIC ARCH: Normal appearance of the thoracic arch, Common origin of the innominate and LEFT  Common carotid artery. The origins of the innominate, left Common carotid artery and subclavian artery are widely patent. RIGHT CAROTID SYSTEM: Common carotid artery is widely patent, coursing in a straight line fashion. Normal appearance of the carotid bifurcation without hemodynamically significant stenosis by NASCET criteria. Mild eccentric calcific atherosclerosis Normal appearance of the included internal carotid artery. LEFT CAROTID SYSTEM: Common carotid artery is widely patent, coursing in a straight line fashion. Normal appearance of the carotid bifurcation without hemodynamically significant stenosis by NASCET criteria. Mild eccentric intimal thickening and calcific atherosclerosis. Normal appearance of the included internal carotid artery. VERTEBRAL ARTERIES:Left vertebral artery is dominant. Normal appearance of the vertebral arteries, which appear widely patent. SKELETON: No acute osseous process though bone windows have not been submitted. Patient is edentulous. Moderate C5-6 and C6-7 canal stenosis. OTHER NECK: Soft tissues of the neck are non-acute though, not tailored for evaluation. CTA HEAD ANTERIOR CIRCULATION: Patent bilateral internal carotid arteries. Calcific atherosclerosis results in severe stenosis RIGHT supraclinoid internal  carotid artery, mild stenosis on the LEFT. Developmentally diminutive RIGHT A1 segment, the bilateral anterior cerebral arteries predominately arise from LEFT A1-2 junction appear widely patent. Mild stenosis proximal LEFT M1 segment. Moderate stenosis LEFT superior M2 segment. Severe stenosis RIGHT M2 inferior segment origin. No large vessel occlusion,  contrast extravasation or aneurysm. POSTERIOR CIRCULATION: RIGHT vertebral artery terminates in the posterior inferior cerebellar artery with severe stenosis RIGHT height origin. LEFT vertebral artery demonstrates mild stenosis. Moderate stenosis proximal basilar artery. Severe stenosis mid basilar artery with thready contrast opacification distally. Small bilateral posterior communicating arteries are present. Moderate tandem stenosis bilateral posterior cerebral arteries. No  contrast extravasation or aneurysm. VENOUS SINUSES: Major dural venous sinuses are patent though not tailored for evaluation on this angiographic examination. ANATOMIC VARIANTS: None. DELAYED PHASE: No abnormal intracranial enhancement. IMPRESSION: CTA NECK: Mild atherosclerosis without hemodynamically significant stenosis or acute vascular process. CTA HEAD:  No emergent large vessel occlusion. Multifocal moderate and severe stenosis of the anterior and posterior circulation including severe RIGHT supraclinoid internal carotid artery and severe mid basilar artery. Electronically Signed   By: Elon Alas M.D.   On: 04/13/2016 03:18   Ct Angio Neck W Or Wo Contrast  Result Date: 04/13/2016 CLINICAL DATA:  Feeling unwell, diaphoretic, syncopal episode, 10 minutes loss of consciousness. Intermittent slurred speech. Assess stroke, follow-up abnormal MRA. History of diabetes. EXAM: CT ANGIOGRAPHY HEAD AND NECK TECHNIQUE: Multidetector CT imaging of the head and neck was performed using the standard protocol during bolus administration of intravenous contrast. Multiplanar CT image  reconstructions and MIPs were obtained to evaluate the vascular anatomy. Carotid stenosis measurements (when applicable) are obtained utilizing NASCET criteria, using the distal internal carotid diameter as the denominator. CONTRAST:  50 cc Isovue 370 COMPARISON:  MRI and MRA head August 5th 2017. FINDINGS: CTA NECK AORTIC ARCH: Normal appearance of the thoracic arch, Common origin of the innominate and LEFT Common carotid artery. The origins of the innominate, left Common carotid artery and subclavian artery are widely patent. RIGHT CAROTID SYSTEM: Common carotid artery is widely patent, coursing in a straight line fashion. Normal appearance of the carotid bifurcation without hemodynamically significant stenosis by NASCET criteria. Mild eccentric calcific atherosclerosis Normal appearance of the included internal carotid artery. LEFT CAROTID SYSTEM: Common carotid artery is widely patent, coursing in a straight line fashion. Normal appearance of the carotid bifurcation without hemodynamically significant stenosis by NASCET criteria. Mild eccentric intimal thickening and calcific atherosclerosis. Normal appearance of the included internal carotid artery. VERTEBRAL  ARTERIES:Left vertebral artery is dominant. Normal appearance of the vertebral arteries, which appear widely patent. SKELETON: No acute osseous process though bone windows have not been submitted. Patient is edentulous. Moderate C5-6 and C6-7 canal stenosis. OTHER NECK: Soft tissues of the neck are non-acute though, not tailored for evaluation. CTA HEAD ANTERIOR CIRCULATION: Patent bilateral internal carotid arteries. Calcific atherosclerosis results in severe stenosis RIGHT supraclinoid internal carotid artery, mild stenosis on the LEFT. Developmentally diminutive RIGHT A1 segment, the bilateral anterior cerebral arteries predominately arise from LEFT A1-2 junction appear widely patent. Mild stenosis proximal LEFT M1 segment. Moderate stenosis LEFT  superior M2 segment. Severe stenosis RIGHT M2 inferior segment origin. No large vessel occlusion,  contrast extravasation or aneurysm. POSTERIOR CIRCULATION: RIGHT vertebral artery terminates in the posterior inferior cerebellar artery with severe stenosis RIGHT height origin. LEFT vertebral artery demonstrates mild stenosis. Moderate stenosis proximal basilar artery. Severe stenosis mid basilar artery with thready contrast opacification distally. Small bilateral posterior communicating arteries are present. Moderate tandem stenosis bilateral posterior cerebral arteries. No  contrast extravasation or aneurysm. VENOUS SINUSES: Major dural venous sinuses are patent though not tailored for evaluation on this angiographic examination. ANATOMIC VARIANTS: None. DELAYED PHASE: No abnormal intracranial enhancement. IMPRESSION: CTA NECK: Mild atherosclerosis without hemodynamically significant stenosis or acute vascular process. CTA HEAD:  No emergent large vessel occlusion. Multifocal moderate and severe stenosis of the anterior and posterior circulation including severe RIGHT supraclinoid internal carotid artery and severe mid basilar artery. Electronically Signed   By: Elon Alas M.D.   On: 04/13/2016 03:18   Mr Jodene Nam Head Wo Contrast  Result Date: 04/12/2016 CLINICAL DATA:  Syncopal episodes today. First episode began after mowing the lawn. Patient then had right upper extremity weakness, right facial droop, and slurred speech. The symptoms have since resolved. EXAM: MRI HEAD WITHOUT CONTRAST MRA HEAD WITHOUT CONTRAST TECHNIQUE: Multiplanar, multiecho pulse sequences of the brain and surrounding structures were obtained without intravenous contrast. Angiographic images of the head were obtained using MRA technique without contrast. COMPARISON:  None. FINDINGS: MRI HEAD FINDINGS The diffusion-weighted images demonstrate no acute or subacute infarct. There are remote infarcts involving the medial occipital lobe  bilaterally. Mild generalized atrophy and periventricular white matter T2 hyperintensities are within normal limits for age. The ventricles are proportionate to the degree of atrophy. No significant extra-axial fluid collection is present. Internal auditory canals are within normal limits bilaterally. Flow is present in the major intracranial arteries. Bilateral lens replacements are present. There is scleral banding on the left. The globes and orbits are otherwise intact. The paranasal sinuses and mastoid air cells are clear. MRA HEAD FINDINGS Internal carotid arteries demonstrate mild tortuosity in the cervical segments bilaterally. There is mild narrowing of the distal right ICA just proximal to the ICA terminus. The left internal carotid artery is intact through the ICA terminus. Moderate proximal stenoses are present in the right A1 and M1 segment. The left A1 segment is dominant with epi and anterior communicating artery. There is mild narrowing of the left M1 segment proximally. The MCA bifurcations are intact bilaterally. ACA and MCA branch vessels are intact. There is mild distal narrowing, within normal limits for age. The right vertebral artery essentially terminates at the PICA. The left vertebral artery is the dominant vessel. There is a moderate to high-grade stenosis in the mid basilar artery. Both posterior cerebral arteries originate from the basilar tip. There is moderate attenuation of PCA branch vessels bilaterally IMPRESSION: 1. No acute infarct. 2. Remote bilateral occipital  lobe infarcts. 3. Mild atrophy and white matter disease is otherwise within normal limits for age. 4. Moderate to high-grade stenoses of the proximal M1 and A1 segments. 5. Severe mid basilar stenosis. 6. Moderate segmental narrowing within the posterior cerebral arteries bilaterally. Electronically Signed   By: San Morelle M.D.   On: 04/12/2016 18:55   Mr Brain Wo Contrast  Result Date: 04/12/2016 CLINICAL  DATA:  Syncopal episodes today. First episode began after mowing the lawn. Patient then had right upper extremity weakness, right facial droop, and slurred speech. The symptoms have since resolved. EXAM: MRI HEAD WITHOUT CONTRAST MRA HEAD WITHOUT CONTRAST TECHNIQUE: Multiplanar, multiecho pulse sequences of the brain and surrounding structures were obtained without intravenous contrast. Angiographic images of the head were obtained using MRA technique without contrast. COMPARISON:  None. FINDINGS: MRI HEAD FINDINGS The diffusion-weighted images demonstrate no acute or subacute infarct. There are remote infarcts involving the medial occipital lobe bilaterally. Mild generalized atrophy and periventricular white matter T2 hyperintensities are within normal limits for age. The ventricles are proportionate to the degree of atrophy. No significant extra-axial fluid collection is present. Internal auditory canals are within normal limits bilaterally. Flow is present in the major intracranial arteries. Bilateral lens replacements are present. There is scleral banding on the left. The globes and orbits are otherwise intact. The paranasal sinuses and mastoid air cells are clear. MRA HEAD FINDINGS Internal carotid arteries demonstrate mild tortuosity in the cervical segments bilaterally. There is mild narrowing of the distal right ICA just proximal to the ICA terminus. The left internal carotid artery is intact through the ICA terminus. Moderate proximal stenoses are present in the right A1 and M1 segment. The left A1 segment is dominant with epi and anterior communicating artery. There is mild narrowing of the left M1 segment proximally. The MCA bifurcations are intact bilaterally. ACA and MCA branch vessels are intact. There is mild distal narrowing, within normal limits for age. The right vertebral artery essentially terminates at the PICA. The left vertebral artery is the dominant vessel. There is a moderate to high-grade  stenosis in the mid basilar artery. Both posterior cerebral arteries originate from the basilar tip. There is moderate attenuation of PCA branch vessels bilaterally IMPRESSION: 1. No acute infarct. 2. Remote bilateral occipital lobe infarcts. 3. Mild atrophy and white matter disease is otherwise within normal limits for age. 4. Moderate to high-grade stenoses of the proximal M1 and A1 segments. 5. Severe mid basilar stenosis. 6. Moderate segmental narrowing within the posterior cerebral arteries bilaterally. Electronically Signed   By: San Morelle M.D.   On: 04/12/2016 18:55   Ct Head Code Stroke W/o Cm  Result Date: 04/12/2016 CLINICAL DATA:  Code stroke. New onset slurred speech and right arm weakness. EXAM: CT HEAD WITHOUT CONTRAST TECHNIQUE: Contiguous axial images were obtained from the base of the skull through the vertex without intravenous contrast. COMPARISON:  03/26/2016 FINDINGS: There is no evidence of acute cortical infarct, intracranial hemorrhage, mass, midline shift, or extra-axial fluid collection. Mild cerebral atrophy is within normal limits for age. A small chronic right occipital cortical infarct is unchanged. Prior bilateral cataract extraction and left scleral banding are again noted. There is calcified atherosclerosis at the skullbase. The visualized paranasal sinuses and mastoid air cells are clear. No acute osseous abnormality is identified. ASPECTS [facility] Stroke Program Early CT Score, [email or web address]) - Ganglionic level infarction (caudate, lentiform nuclei, internal capsule, insula, M1-M3 cortex): 7 - Supraganglionic infarction (M4-M6 cortex): 3 Total score (0-10 with  10 being normal): 10 IMPRESSION: 1. No evidence of acute intracranial abnormality. 2. ASPECTS score 10. 3. Chronic right occipital infarct. These results were called by telephone at the time of interpretation on 04/12/2016 at 4:13 pm to Dr. Shon Hale, who verbally acknowledged these results.  Electronically Signed   By: Logan Bores M.D.   On: 04/12/2016 16:15        Scheduled Meds: . aspirin  300 mg Rectal Daily   Or  . aspirin  325 mg Oral Daily  . atorvastatin  80 mg Oral Daily  . clopidogrel  75 mg Oral Daily  . heparin subcutaneous  5,000 Units Subcutaneous Q8H  . insulin aspart  0-5 Units Subcutaneous QHS  . insulin aspart  0-9 Units Subcutaneous TID WC   Continuous Infusions: . sodium chloride 100 mL/hr at 04/12/16 2233     LOS: 0 days    Time spent: 25 min    [place], DO [facility] Pager [phone]  If 7PM-7AM, please contact night-coverage [email or web address] Password [email or web address] 04/13/2016, 12:08 PM

## 2016-04-13 NOTE — Progress Notes (Signed)
Mr. Bajo had a 10-15 minute period of slurred speech which spontanously resolved.  No other neurological changes noted at time.  MD aware.  Vitals stable. Will continue to monitor and notify MD of changes.

## 2016-04-13 NOTE — Progress Notes (Signed)
VASCULAR LAB     Patient had normal CTA of the head and neck.  Please advise if carotid duplex still needed.  Thank you,  Martita Brumm, RVT 04/13/2016, 8:45 AM

## 2016-04-13 NOTE — Evaluation (Signed)
Physical Therapy Evaluation & Discharge Patient Details Name: James Blevins MRN: [id number] DOB: 11/23/1945 Today's Date: 04/13/2016   History of Present Illness  James Miera Gregoryis a 70 y.o.malewith a history of previous stroke and TIAs. PMH also includes diabete, HTN, an HLD. He was admitted with recurrent episodes of right-sided weakness and dysarthria. MRI negtive for acute infarct.  Clinical Impression  Pt admitted with above. Pt at a decreased fall risk as demonstrated by a DGI score of 22. Pt lives at home with wife who is available for any assistance needed. Pt is back to baseline and is safe to return home as soon as medically stable. No follow-up PT recommended, and all education is completed at this time. PT signing off. Please reconsult if necessary in the future.    Follow Up Recommendations No PT follow up    Equipment Recommendations  None recommended by PT    Recommendations for Other Services       Precautions / Restrictions Precautions Precautions: Fall Restrictions Weight Bearing Restrictions: No      Mobility  Bed Mobility Overal bed mobility: Modified Independent             General bed mobility comments: HOB elevated  Transfers Overall transfer level: Modified independent Equipment used: None Transfers: Sit to/from Stand Sit to Stand: Modified independent (Device/Increase time)            Ambulation/Gait Ambulation/Gait assistance: Independent Ambulation Distance (Feet): 300 Feet Assistive device: None Gait Pattern/deviations: WFL(Within Functional Limits);Step-through pattern   Gait velocity interpretation: at or above normal speed for age/gender General Gait Details: No LOB observed  Stairs Stairs: Yes Stairs assistance: Supervision Stair Management: One rail Right;Alternating pattern Number of Stairs: 3    Wheelchair Mobility    Modified Rankin (Stroke Patients Only) Modified Rankin (Stroke Patients Only) Pre-Morbid  Rankin Score: No symptoms Modified Rankin: No symptoms     Balance Overall balance assessment: Modified Independent                               Standardized Balance Assessment Standardized Balance Assessment : Dynamic Gait Index   Dynamic Gait Index Level Surface: Normal Change in Gait Speed: Normal Gait with Horizontal Head Turns: Normal Gait with Vertical Head Turns: Normal Gait and Pivot Turn: Normal Step Over Obstacle: Mild Impairment Step Around Obstacles: Normal Steps: Mild Impairment Total Score: 22       Pertinent Vitals/Pain Pain Assessment: No/denies pain    Home Living Family/patient expects to be discharged to:: Private residence Living Arrangements: Spouse/significant other Available Help at Discharge: Family Type of Home: House Home Access: Stairs to enter Entrance Stairs-Rails: Left Entrance Stairs-Number of Steps: 3 Home Layout: Two level;Able to live on main level with bedroom/bathroom Home Equipment: None      Prior Function Level of Independence: Independent         Comments: Pt walks 1-2 miles in the morning at home.      Hand Dominance   Dominant Hand: Right    Extremity/Trunk Assessment   Upper Extremity Assessment: Overall WFL for tasks assessed           Lower Extremity Assessment: Overall WFL for tasks assessed      Cervical / Trunk Assessment: Kyphotic  Communication   Communication: No difficulties  Cognition Arousal/Alertness: Awake/alert Behavior During Therapy: WFL for tasks assessed/performed Overall Cognitive Status: Within Functional Limits for tasks assessed  General Comments General comments (skin integrity, edema, etc.): Pt reports he feels like he is back to baseline. Pt educated on importance of staying active and continuing his daily walks.     Exercises        Assessment/Plan    PT Assessment Patent does not need any further PT services  PT Diagnosis  Difficulty walking   PT Problem List    PT Treatment Interventions     PT Goals (Current goals can be found in the Care Plan section) Acute Rehab PT Goals Patient Stated Goal: to go home PT Goal Formulation: All assessment and education complete, DC therapy    Frequency     Barriers to discharge        Co-evaluation               End of Session Equipment Utilized During Treatment: Gait belt Activity Tolerance: Patient tolerated treatment well Patient left: in chair;with family/visitor present;with call bell/phone within reach Nurse Communication: Mobility status    Functional Assessment Tool Used: clinical judgement Functional Limitation: Mobility: Walking and moving around Mobility: Walking and Moving Around Current Status [id number]): At least 1 percent but less than 20 percent impaired, limited or restricted Mobility: Walking and Moving Around Goal Status [phone]): At least 1 percent but less than 20 percent impaired, limited or restricted Mobility: Walking and Moving Around Discharge Status [phone]): At least 1 percent but less than 20 percent impaired, limited or restricted    Time: 0750-0814 PT Time Calculation (min) (ACUTE ONLY): 24 min   Charges:   PT Evaluation $PT Eval Moderate Complexity: 1 Procedure PT Treatments $Gait Training: 8-22 mins   PT G Codes:   PT G-Codes **NOT FOR INPATIENT CLASS** Functional Assessment Tool Used: clinical judgement Functional Limitation: Mobility: Walking and moving around Mobility: Walking and Moving Around Current Status [id number]): At least 1 percent but less than 20 percent impaired, limited or restricted Mobility: Walking and Moving Around Goal Status [phone]): At least 1 percent but less than 20 percent impaired, limited or restricted Mobility: Walking and Moving Around Discharge Status [phone]): At least 1 percent but less than 20 percent impaired, limited or restricted    [facility] 04/13/2016, 10:03 AM  Tawni Millers, SPT  (student physical therapist) [place] [phone]

## 2016-04-14 DIAGNOSIS — G451 Carotid artery syndrome (hemispheric): Secondary | ICD-10-CM | POA: Diagnosis not present

## 2016-04-14 LAB — GLUCOSE, CAPILLARY
GLUCOSE-CAPILLARY: 153 mg/dL — AB (ref 65–99)
GLUCOSE-CAPILLARY: 140 mg/dL — AB (ref 65–99)
Glucose-Capillary: 179 mg/dL — ABNORMAL HIGH (ref 65–99)

## 2016-04-14 LAB — HEMOGLOBIN A1C
HEMOGLOBIN A1C: 8.4 % — AB (ref 4.8–5.6)
Mean Plasma Glucose: 194 mg/dL

## 2016-04-14 LAB — CORTISOL: CORTISOL PLASMA: 6.7 ug/dL

## 2016-04-14 MED ORDER — SITAGLIPTIN PHOS-METFORMIN HCL 50-1000 MG PO TABS: 1.0000 | ORAL_TABLET | Freq: Two times a day (BID) | ORAL | Status: AC

## 2016-04-14 MED ORDER — ASPIRIN 325 MG PO TABS: 325.0000 mg | ORAL_TABLET | Freq: Every day | ORAL | 1 refills | Status: AC

## 2016-04-14 MED ORDER — CYANOCOBALAMIN 1000 MCG/ML IJ SOLN: INTRAMUSCULAR | 0 refills | Status: DC

## 2016-04-14 MED ORDER — GLIPIZIDE 5 MG PO TABS: 2.5000 mg | ORAL_TABLET | Freq: Two times a day (BID) | ORAL | 0 refills | Status: AC

## 2016-04-14 MED ORDER — CYANOCOBALAMIN 1000 MCG/ML IJ SOLN: INTRAMUSCULAR | 0 refills | Status: AC

## 2016-04-14 MED ORDER — GLIPIZIDE 5 MG PO TABS: 2.5000 mg | ORAL_TABLET | Freq: Two times a day (BID) | ORAL | 0 refills | Status: DC

## 2016-04-14 MED ORDER — CYANOCOBALAMIN 1000 MCG/ML IJ SOLN
1000.0000 ug | INTRAMUSCULAR | Status: DC
  Administered 2016-04-14: 1000 ug via INTRAMUSCULAR
  Filled 2016-04-14: qty 1

## 2016-04-14 MED ORDER — ASPIRIN 325 MG PO TABS: 325.0000 mg | ORAL_TABLET | Freq: Every day | ORAL | 1 refills | Status: DC

## 2016-04-14 NOTE — Progress Notes (Signed)
Patient ready for discharge, is waiting for lab to draw for labs added by MD.

## 2016-04-14 NOTE — Progress Notes (Signed)
STROKE TEAM PROGRESS NOTE   HISTORY OF PRESENT ILLNESS (per record) James Blevins is a 70 y.o. male with a history of previous stroke as well as TIAs who presents with recurrent episodes of right-sided weakness and dysarthria. This morning around 10:30 AM, he passed out, his wife called EMS and subsequently passed on the second time. No clear shaking or seizure-like activity. He developed right-sided weakness and slurred speech while he was waiting in the emergency department. He was taken for a stat CT of his head, and by the time he returned he was back at baseline. A code stroke was called, however since he had returned to baseline this was changed to TIA alert.    SUBJECTIVE (INTERVAL HISTORY) No acute events overnight. Has not had any more attacks since last night .Patient and family recounted history the patient had walked out in the sun for a while with his dog and then subsequently was working out in his yard work 30 minutes before he started feeling unwell and subsequently had an episode of passing out. He was slightly dehydrated when he was admitted.   OBJECTIVE Temp:  [98 F (36.7 C)-98.4 F (36.9 C)] 98.4 F (36.9 C) (08/07 1308) Pulse Rate:  [61-73] 70 (08/07 1308) Cardiac Rhythm: Normal sinus rhythm (08/07 0700) Resp:  [14-20] 14 (08/07 1308) BP: (149-168)/(64-86) 149/68 (08/07 1308) SpO2:  [94 %-98 %] 96 % (08/07 1308)  CBC:   Recent Labs Lab 04/12/16 1319 04/12/16 1602 04/12/16 1614  WBC 6.9 7.5  --   NEUTROABS 5.4 6.0  --   HGB 14.7 14.3 13.6  HCT 42.9 42.3 40.0  MCV 89.9 89.6  --   PLT 147* 149*  --     Basic Metabolic Panel:   Recent Labs Lab 04/12/16 1319 04/12/16 1602 04/12/16 1614  NA 139 140 142  K 4.3 4.4 4.3  CL 105 107 103  CO2 26 27  --   GLUCOSE 209* 166* 164*  BUN 17 17 19   CREATININE 1.06 0.90 0.80  CALCIUM 9.2 8.8*  --     Lipid Panel:     Component Value Date/Time   CHOL 97 04/13/2016 0735   TRIG 80 04/13/2016 0735   HDL  26 (L) 04/13/2016 0735   CHOLHDL 3.7 04/13/2016 0735   VLDL 16 04/13/2016 0735   LDLCALC 55 04/13/2016 0735   HgbA1c:  Lab Results  Component Value Date   HGBA1C 8.4 (H) 04/12/2016   Urine Drug Screen:     Component Value Date/Time   LABOPIA NONE DETECTED 04/13/2016 0027   COCAINSCRNUR NONE DETECTED 04/13/2016 0027   LABBENZ NONE DETECTED 04/13/2016 0027   AMPHETMU NONE DETECTED 04/13/2016 0027   THCU NONE DETECTED 04/13/2016 0027   LABBARB NONE DETECTED 04/13/2016 0027      IMAGING   Ct Angio Neck and Neck W Or Wo Contrast 04/13/2016  CTA NECK:  Mild atherosclerosis without hemodynamically significant stenosis or acute vascular process.   CTA HEAD:   No emergent large vessel occlusion. Multifocal moderate and severe stenosis of the anterior and posterior circulation including severe RIGHT supraclinoid internal carotid artery and severe mid basilar artery.    Mr Jodene Nam Head Wo Contrast 04/12/2016 1. No acute infarct.  2. Remote bilateral occipital lobe infarcts.  3. Mild atrophy and white matter disease is otherwise within normal limits for age.  4. Moderate to high-grade stenoses of the proximal M1 and A1 segments.  5. Severe mid basilar stenosis.  6. Moderate segmental narrowing within  the posterior cerebral arteries bilaterally.    Ct Head Code Stroke W/o Cm 04/12/2016 1. No evidence of acute intracranial abnormality.  2. ASPECTS score 10.  3. Chronic right occipital infarct.      PHYSICAL EXAM  Physical Exam  Constitutional: Appears well-developed and well-nourished.  Psych: Affect appropriate to situation Eyes: No scleral injection HENT: No OP obstrucion Head: Normocephalic.  Cardiovascular: Normal rate and regular rhythm.  Respiratory: Effort normal and breath sounds normal to anterior ascultation GI: Soft.  No distension. There is no tenderness.  Skin: WDI  Neuro: Mental Status: Patient is awake, alert, oriented to person, place, month, year, and  situation. Patient is able to give a clear and coherent history. No signs of aphasia or neglect Cranial Nerves: II: Visual Fields are full. Pupils are equal, round, and reactive to light.   III,IV, VI: EOMI without ptosis or diploplia.  V: Facial sensation is symmetric to temperature VII: Facial movement is symmetric.  VIII: hearing is intact to voice X: Uvula elevates symmetrically XI: Shoulder shrug is symmetric. XII: tongue is midline without atrophy or fasciculations.  Motor: Tone is normal. Bulk is normal. 5/5 strength was present in all four extremities.  Sensory: Sensation is symmetric to light touch and temperature in the arms and legs. Cerebellar: FNF and HKS are intact bilaterally     ASSESSMENT/PLAN Mr. James Blevins is a 70 y.o. male with history of previous strokes and diabetes mellitus presenting with right hemiparesis and dysarthria. He did not receive IV t-PA due to resolution of deficits.  Possible TIA:  Dominant   Resultant - resolution of deficits.  MRI  No acute infarct. Remote bilateral occipital lobe infarcts.  MRA  Moderate to high-grade stenoses of the proximal M1 and A1 segments. Severe mid basilar stenosis.   Carotid Doppler - refer to CTA of the neck.  CTA neck - Multifocal moderate and severe stenosis of the anterior and posterior circulation including  severe RIGHT supraclinoid internal carotid artery and severe mid basilar artery.   2D Echo - pending  LDL 55  HgbA1c pending  VTE prophylaxis - subcutaneous heparin Diet Carb Modified Fluid consistency: Thin; Room service appropriate? Yes  aspirin 81 mg daily and clopidogrel 75 mg daily prior to admission, now on aspirin 325 mg daily and clopidogrel 75 mg daily  Patient counseled to be compliant with his antithrombotic medications  Ongoing aggressive stroke risk factor management  Therapy recommendations: No further physical or occupational therapy recommended  Disposition:  Pending  Hypertension  Mildly high blood pressures  Permissive hypertension (OK if < 220/120) but gradually normalize in 5-7 days  Long-term BP goal normotensive  Hyperlipidemia  Home meds: Lipitor 80 mg daily resumed in hospital  LDL 55, goal < 70  Continue statin at discharge  Diabetes  HgbA1c pending, goal < 7.0  Uncontrolled  Other Stroke Risk Factors  Advanced age  ETOH use, advised to drink no more than 1 - 2 drink(s) a day  Hx stroke/TIA  Family hx stroke (maternal grandmother)   Other Active Problems  Mild thrombocytopenia   PLAN  Continue dual antiplatelet therapy for 3 months then Plavix alone.  ? Candidate for R ICA stent. Discuss with Dr Leonie Man.  Hospital day # 0  I have personally examined this patient, reviewed notes, independently viewed imaging studies, participated in medical decision making and plan of care. I have made any additions or clarifications directly to the above note. I had a long discussion with the patient, wife  and daughter at the bedside regarding his symptoms of TIA in the setting of dehydration and multiy vessel intracranial stenosis. Patient remains at risk for recurrent stroke, TIA and needs aggressive risk factor management. Results of recent studies have not demonstrated benefit of intracranial stenting over medical therapy. I recommend ongoing aggressive medical treatment. Plan to discuss with medical hospitalist for further evaluation for causes of recurrent dehydration. Check cortisol, ACTH stimulation test and thyroid function tests. Aspirin 325 mg and Plavix 75 mg daily and aggressive control of blood pressure, diabetes and lipids. If patient continues to have recurrent stroke/TIA symptoms may consider catheter angiogram and possible basilar artery stenting. Greater than 50% of time during this 35 minute visit was spent on counseling and coordination of care about stroke and TIA risk, prevention and treatment and answered  questions. Discussed with Dr. Pearletha Alfred, MD Medical Director [place] Pager: [phone] 04/14/2016 3:41 PM     To contact Stroke Continuity provider, please refer to [email or web address] After hours, contact General Neurology

## 2016-04-14 NOTE — Discharge Instructions (Signed)

## 2016-04-14 NOTE — Progress Notes (Signed)
Patient received discharge instructions and is now waiting on phlebotomy for final lab draw. Verbalized understanding of medicine changes. Daughter verbalized understanding of how to administer Vit b12 injection after instruction.   Final neuro assessment WNL.  Celestia Khat, RN

## 2016-04-14 NOTE — Care Management Note (Signed)
Case Management Note  Patient Details  Name: James Blevins MRN: [id number] Date of Birth: [birth year]  Subjective/Objective:                    Action/Plan: Pt discharging home with self care. No further needs per CM.   Expected Discharge Date:                  Expected Discharge Plan:  Home/Self Care  In-House Referral:     Discharge planning Services     Post Acute Care Choice:    Choice offered to:     DME Arranged:    DME Agency:     HH Arranged:    [place] Agency:     Status of Service:  Completed, signed off  If discussed at [employer] of Stay Meetings, dates discussed:    Additional Comments:  Pollie Friar, RN 04/14/2016, 4:13 PM

## 2016-04-14 NOTE — Progress Notes (Signed)
Patient aware he needs to d/c [employer] for two days, take glipizide for 2 days instead. Barbra Sarks, RN escorted patient and family to car.

## 2016-04-14 NOTE — Discharge Summary (Signed)
James Blevins, is a 70 y.o. male  DOB 09-07-1946  MRN [id number].  Admission date:  04/12/2016  Admitting Physician  Ripudeep Krystal Eaton, MD  Discharge Date:  04/14/2016   Primary MD  Tivis Ringer, MD  Recommendations for primary care physician for things to follow:  - please follow on an cortisol level. - Patient will be called deficiency, follow on pernicious anemia workup, and intrinsic factor antibody, antiparietal antibody, to be sent prior to discharge.   Admission Diagnosis  TIA (transient ischemic attack) [G45.9] Transient cerebral ischemia, unspecified transient cerebral ischemia type [G45.9] Syncope, unspecified syncope type [R55]   Discharge Diagnosis  TIA (transient ischemic attack) [G45.9] Transient cerebral ischemia, unspecified transient cerebral ischemia type [G45.9] Syncope, unspecified syncope type [R55]    Principal Problem:   TIA (transient ischemic attack) Active Problems:   Type 2 diabetes mellitus with retinopathy of both eyes, without long-term current use of insulin (HCC)   Essential hypertension   Dehydration   Syncope      Past Medical History:  Diagnosis Date  . Cataracts, bilateral   . Diabetes ([place])   . Retinal detachment left eye, 08/2013  . Skin cancer   . Stroke ([place])   . Stroke due to embolism ([place]) 01/14/2016    Past Surgical History:  Procedure Laterality Date  . COLONOSCOPY  2002  . EYE SURGERY    . skin cancer removal    . TONSILLECTOMY  childhood       History of present illness and  Hospital Course:     Kindly see H&P for history of present illness and admission details, please review complete Labs, Consult reports and Test reports for all details in brief  HPI  from the history and physical done on the day of admission     Hospital Course   Patient is a 70 year old male with history of type 2 diabetes mellitus, recurrent TIA  hypertension, hyperlipidemia presented to ED after he passed out at home. History was obtained from the patient and his wife, son in the room. Patient reported that he was mowing his yard today around 10:30 AM, went back into the house when his wife found him staggering, clammy and diaphoretic. He sat down and then subsequently passed out. Patient's wife called the EMS, subsequently he passed out a second time that was seen by EMS staff. Patient's BP was in 130s (which per patient is lower for him). He denied any chest pain, palpitations prior to the episode. No seizure-like activity was seen by the wife or EMS staff. While waiting in ED, around 3:50 PM, patient was noticed to have right upper extremity weakness, right facial droop and slurred speech. This was witnessed by the ED staff. Code stroke was called and patient underwent CT head. Once he returned from the CT head after 10 minutes, he was back to his baseline. Rapid response was called and neurology was consulted. [facility] hospitalist service was requested for admission. Per patient he had similar presentation in November 2016 when he  had multiple strokes and syncopal episode and was admitted in [place] for one week. Patient was recently discharged from the hospital on 7/20 with near syncopal episode and was found to be orthostatic, dehydration and acute kidney injury. ED work-up/course:  CT head negative for acute CVA, CBC, BMET unremarkable. Troponin less than 0.0, EKG showed normal sinus rhythm, no acute ST-T wave changes suggestive of ischemia.  Hospital course:  Possible TIA (transient ischemic attack)  - Neurology consult appreciated, patient was noticed that deficits, MRI brain with no acute infarcts, remote bilateral occipital lobe infarcts, MRA with moderate to high degree stenosis of the proximal M1 and A1 segment, severe mid basilar stenosis, CTA neck with multifocal moderate and severe stenosis of the anterior and posterior circulating  including severe right supraclinoid internal carotid artery and severe mid basilar artery. - Patient on aspirin 81 mg and Plavix 75 mg daily, to be discharged on full dose aspirin 325 mg oral daily and Plavix 75 mg daily, LDL 55, continue with home dose statin, AGA, with preserved EF,. - Was felt orthostatic cysts contributing to patient's TIA, so far unclear etiology, possible secondary to neuropathy from diabetes, as well versus B-12 neuropathy given his B-12 deficiency, will treat B-12 deficiency.    Type 2 diabetes mellitus with retinopathy of both eyes, without long-term current use of insulin (HCC) - A1c 8.4, hold Janumet at time of discharge given he received IV contrast, we'll discharge on glipizide for 2 days until able to resume his Janumet.  B-12 deficiency - B12 during last visit is 119, most likely contributing to neuropathy, will obtain pernicious anemia workup, will send antiparietal antibody, anti-intrinsic factor antibody, PCP to follow on the results, this was discussed with patient and family   Hyperlipidemia -LDL: 55    Discharge Condition:  Stable   Follow UP  Follow-up Information    AVVA,RAVISANKAR R, MD Follow up in 1 week(s).   Specialty:  Internal Medicine Contact information: [address] [place] [address] [phone]             Discharge Instructions  and  Discharge Medications     Discharge Instructions    Discharge instructions    Complete by:  As directed   Follow with Primary MD Tivis Ringer, MD in 7 days   Get CBC, CMP,  checked  by Primary MD next visit.    Activity: As tolerated with Full fall precautions use walker/cane & assistance as needed   Disposition Home    Diet: Heart Healthy , carbohydrate modified , with feeding assistance and aspiration precautions.  For Heart failure patients - Check your Weight same time everyday, if you gain over 2 pounds, or you develop in leg swelling, experience more  shortness of breath or chest pain, call your Primary MD immediately. Follow Cardiac Low Salt Diet and 1.5 lit/day fluid restriction.   On your next visit with your primary care physician please Get Medicines reviewed and adjusted.   Please request your Prim.MD to go over all Hospital Tests and Procedure/Radiological results at the follow up, please get all Hospital records sent to your Prim MD by signing hospital release before you go home.   If you experience worsening of your admission symptoms, develop shortness of breath, life threatening emergency, suicidal or homicidal thoughts you must seek medical attention immediately by calling 911 or calling your MD immediately  if symptoms less severe.  You Must read complete instructions/literature along with all the possible adverse reactions/side effects for all  the Medicines you take and that have been prescribed to you. Take any new Medicines after you have completely understood and accpet all the possible adverse reactions/side effects.   Do not drive, operating heavy machinery, perform activities at heights, swimming or participation in water activities or provide baby sitting services if your were admitted for syncope or siezures until you have seen by Primary MD or a Neurologist and advised to do so again.  Do not drive when taking Pain medications.    Do not take more than prescribed Pain, Sleep and Anxiety Medications  Special Instructions: If you have smoked or chewed Tobacco  in the last 2 yrs please stop smoking, stop any regular Alcohol  and or any Recreational drug use.  Wear Seat belts while driving.   Please note  You were cared for by a hospitalist during your hospital stay. If you have any questions about your discharge medications or the care you received while you were in the hospital after you are discharged, you can call the unit and asked to speak with the hospitalist on call if the hospitalist that took care of you is  not available. Once you are discharged, your primary care physician will handle any further medical issues. Please note that NO REFILLS for any discharge medications will be authorized once you are discharged, as it is imperative that you return to your primary care physician (or establish a relationship with a primary care physician if you do not have one) for your aftercare needs so that they can reassess your need for medications and monitor your lab values.   Increase activity slowly    Complete by:  As directed       Medication List    TAKE these medications   aspirin 325 MG tablet Take 1 tablet (325 mg total) by mouth daily. What changed:  medication strength  how much to take   atorvastatin 80 MG tablet Commonly known as:  LIPITOR Take 80 mg by mouth daily.   clopidogrel 75 MG tablet Commonly known as:  PLAVIX Take 75 mg by mouth daily.   cyanocobalamin 1000 MCG/ML injection Commonly known as:  (VITAMIN B-12) Please take once daily for next 7 days, then once weekly for 4 weeks, then once monthly.   glipiZIDE 5 MG tablet Commonly known as:  GLUCOTROL Take 0.5 tablets (2.5 mg total) by mouth 2 (two) times daily before a meal. Please take for 2 days, then stop once or you resumed her Janumet back   sitaGLIPtin-metformin 50-1000 MG tablet Commonly known as:  JANUMET Take 1 tablet by mouth 2 (two) times daily with a meal. Please hold for next 2 days as you have received IV contrast during hospital stay, resume on 04/17/2016 Start taking on:  04/17/2016 What changed:  additional instructions         Diet and Activity recommendation: See Discharge Instructions above   Consults obtained -  Neurology   Major procedures and Radiology Reports - PLEASE review detailed and final reports for all details, in brief -      Ct Angio Head W Or Wo Contrast  Result Date: 04/13/2016 CLINICAL DATA:  Feeling unwell, diaphoretic, syncopal episode, 10 minutes loss of consciousness.  Intermittent slurred speech. Assess stroke, follow-up abnormal MRA. History of diabetes. EXAM: CT ANGIOGRAPHY HEAD AND NECK TECHNIQUE: Multidetector CT imaging of the head and neck was performed using the standard protocol during bolus administration of intravenous contrast. Multiplanar CT image reconstructions and MIPs were obtained to  evaluate the vascular anatomy. Carotid stenosis measurements (when applicable) are obtained utilizing NASCET criteria, using the distal internal carotid diameter as the denominator. CONTRAST:  50 cc Isovue 370 COMPARISON:  MRI and MRA head August 5th 2017. FINDINGS: CTA NECK AORTIC ARCH: Normal appearance of the thoracic arch, Common origin of the innominate and LEFT Common carotid artery. The origins of the innominate, left Common carotid artery and subclavian artery are widely patent. RIGHT CAROTID SYSTEM: Common carotid artery is widely patent, coursing in a straight line fashion. Normal appearance of the carotid bifurcation without hemodynamically significant stenosis by NASCET criteria. Mild eccentric calcific atherosclerosis Normal appearance of the included internal carotid artery. LEFT CAROTID SYSTEM: Common carotid artery is widely patent, coursing in a straight line fashion. Normal appearance of the carotid bifurcation without hemodynamically significant stenosis by NASCET criteria. Mild eccentric intimal thickening and calcific atherosclerosis. Normal appearance of the included internal carotid artery. VERTEBRAL ARTERIES:Left vertebral artery is dominant. Normal appearance of the vertebral arteries, which appear widely patent. SKELETON: No acute osseous process though bone windows have not been submitted. Patient is edentulous. Moderate C5-6 and C6-7 canal stenosis. OTHER NECK: Soft tissues of the neck are non-acute though, not tailored for evaluation. CTA HEAD ANTERIOR CIRCULATION: Patent bilateral internal carotid arteries. Calcific atherosclerosis results in severe  stenosis RIGHT supraclinoid internal carotid artery, mild stenosis on the LEFT. Developmentally diminutive RIGHT A1 segment, the bilateral anterior cerebral arteries predominately arise from LEFT A1-2 junction appear widely patent. Mild stenosis proximal LEFT M1 segment. Moderate stenosis LEFT superior M2 segment. Severe stenosis RIGHT M2 inferior segment origin. No large vessel occlusion,  contrast extravasation or aneurysm. POSTERIOR CIRCULATION: RIGHT vertebral artery terminates in the posterior inferior cerebellar artery with severe stenosis RIGHT height origin. LEFT vertebral artery demonstrates mild stenosis. Moderate stenosis proximal basilar artery. Severe stenosis mid basilar artery with thready contrast opacification distally. Small bilateral posterior communicating arteries are present. Moderate tandem stenosis bilateral posterior cerebral arteries. No  contrast extravasation or aneurysm. VENOUS SINUSES: Major dural venous sinuses are patent though not tailored for evaluation on this angiographic examination. ANATOMIC VARIANTS: None. DELAYED PHASE: No abnormal intracranial enhancement. IMPRESSION: CTA NECK: Mild atherosclerosis without hemodynamically significant stenosis or acute vascular process. CTA HEAD:  No emergent large vessel occlusion. Multifocal moderate and severe stenosis of the anterior and posterior circulation including severe RIGHT supraclinoid internal carotid artery and severe mid basilar artery. Electronically Signed   By: Elon Alas M.D.   On: 04/13/2016 03:18   Ct Head Wo Contrast  Result Date: 03/26/2016 CLINICAL DATA:  Dizziness with unsteady gait.  History of strokes. EXAM: CT HEAD WITHOUT CONTRAST TECHNIQUE: Contiguous axial images were obtained from the base of the skull through the vertex without intravenous contrast. COMPARISON:  None. FINDINGS: There is no evidence for acute hemorrhage, hydrocephalus, mass lesion, or abnormal extra-axial fluid collection. No  definite CT evidence for acute infarction. Small focus of encephalomalacia the posterior right parietal lobe is compatible with old infarct. Diffuse loss of parenchymal volume is consistent with atrophy. The visualized paranasal sinuses and mastoid air cells are clear. IMPRESSION: 1. No acute intracranial abnormality. 2. Atrophy with chronic small vessel white matter ischemic demyelination. Electronically Signed   By: Misty Stanley M.D.   On: 03/26/2016 16:59   Ct Angio Neck W Or Wo Contrast  Result Date: 04/13/2016 CLINICAL DATA:  Feeling unwell, diaphoretic, syncopal episode, 10 minutes loss of consciousness. Intermittent slurred speech. Assess stroke, follow-up abnormal MRA. History of diabetes. EXAM: CT ANGIOGRAPHY HEAD AND  NECK TECHNIQUE: Multidetector CT imaging of the head and neck was performed using the standard protocol during bolus administration of intravenous contrast. Multiplanar CT image reconstructions and MIPs were obtained to evaluate the vascular anatomy. Carotid stenosis measurements (when applicable) are obtained utilizing NASCET criteria, using the distal internal carotid diameter as the denominator. CONTRAST:  50 cc Isovue 370 COMPARISON:  MRI and MRA head August 5th 2017. FINDINGS: CTA NECK AORTIC ARCH: Normal appearance of the thoracic arch, Common origin of the innominate and LEFT Common carotid artery. The origins of the innominate, left Common carotid artery and subclavian artery are widely patent. RIGHT CAROTID SYSTEM: Common carotid artery is widely patent, coursing in a straight line fashion. Normal appearance of the carotid bifurcation without hemodynamically significant stenosis by NASCET criteria. Mild eccentric calcific atherosclerosis Normal appearance of the included internal carotid artery. LEFT CAROTID SYSTEM: Common carotid artery is widely patent, coursing in a straight line fashion. Normal appearance of the carotid bifurcation without hemodynamically significant stenosis  by NASCET criteria. Mild eccentric intimal thickening and calcific atherosclerosis. Normal appearance of the included internal carotid artery. VERTEBRAL ARTERIES:Left vertebral artery is dominant. Normal appearance of the vertebral arteries, which appear widely patent. SKELETON: No acute osseous process though bone windows have not been submitted. Patient is edentulous. Moderate C5-6 and C6-7 canal stenosis. OTHER NECK: Soft tissues of the neck are non-acute though, not tailored for evaluation. CTA HEAD ANTERIOR CIRCULATION: Patent bilateral internal carotid arteries. Calcific atherosclerosis results in severe stenosis RIGHT supraclinoid internal carotid artery, mild stenosis on the LEFT. Developmentally diminutive RIGHT A1 segment, the bilateral anterior cerebral arteries predominately arise from LEFT A1-2 junction appear widely patent. Mild stenosis proximal LEFT M1 segment. Moderate stenosis LEFT superior M2 segment. Severe stenosis RIGHT M2 inferior segment origin. No large vessel occlusion,  contrast extravasation or aneurysm. POSTERIOR CIRCULATION: RIGHT vertebral artery terminates in the posterior inferior cerebellar artery with severe stenosis RIGHT height origin. LEFT vertebral artery demonstrates mild stenosis. Moderate stenosis proximal basilar artery. Severe stenosis mid basilar artery with thready contrast opacification distally. Small bilateral posterior communicating arteries are present. Moderate tandem stenosis bilateral posterior cerebral arteries. No  contrast extravasation or aneurysm. VENOUS SINUSES: Major dural venous sinuses are patent though not tailored for evaluation on this angiographic examination. ANATOMIC VARIANTS: None. DELAYED PHASE: No abnormal intracranial enhancement. IMPRESSION: CTA NECK: Mild atherosclerosis without hemodynamically significant stenosis or acute vascular process. CTA HEAD:  No emergent large vessel occlusion. Multifocal moderate and severe stenosis of the anterior  and posterior circulation including severe RIGHT supraclinoid internal carotid artery and severe mid basilar artery. Electronically Signed   By: Elon Alas M.D.   On: 04/13/2016 03:18   Mr Jodene Nam Head Wo Contrast  Result Date: 04/12/2016 CLINICAL DATA:  Syncopal episodes today. First episode began after mowing the lawn. Patient then had right upper extremity weakness, right facial droop, and slurred speech. The symptoms have since resolved. EXAM: MRI HEAD WITHOUT CONTRAST MRA HEAD WITHOUT CONTRAST TECHNIQUE: Multiplanar, multiecho pulse sequences of the brain and surrounding structures were obtained without intravenous contrast. Angiographic images of the head were obtained using MRA technique without contrast. COMPARISON:  None. FINDINGS: MRI HEAD FINDINGS The diffusion-weighted images demonstrate no acute or subacute infarct. There are remote infarcts involving the medial occipital lobe bilaterally. Mild generalized atrophy and periventricular white matter T2 hyperintensities are within normal limits for age. The ventricles are proportionate to the degree of atrophy. No significant extra-axial fluid collection is present. Internal auditory canals are within normal limits bilaterally.  Flow is present in the major intracranial arteries. Bilateral lens replacements are present. There is scleral banding on the left. The globes and orbits are otherwise intact. The paranasal sinuses and mastoid air cells are clear. MRA HEAD FINDINGS Internal carotid arteries demonstrate mild tortuosity in the cervical segments bilaterally. There is mild narrowing of the distal right ICA just proximal to the ICA terminus. The left internal carotid artery is intact through the ICA terminus. Moderate proximal stenoses are present in the right A1 and M1 segment. The left A1 segment is dominant with epi and anterior communicating artery. There is mild narrowing of the left M1 segment proximally. The MCA bifurcations are intact  bilaterally. ACA and MCA branch vessels are intact. There is mild distal narrowing, within normal limits for age. The right vertebral artery essentially terminates at the PICA. The left vertebral artery is the dominant vessel. There is a moderate to high-grade stenosis in the mid basilar artery. Both posterior cerebral arteries originate from the basilar tip. There is moderate attenuation of PCA branch vessels bilaterally IMPRESSION: 1. No acute infarct. 2. Remote bilateral occipital lobe infarcts. 3. Mild atrophy and white matter disease is otherwise within normal limits for age. 4. Moderate to high-grade stenoses of the proximal M1 and A1 segments. 5. Severe mid basilar stenosis. 6. Moderate segmental narrowing within the posterior cerebral arteries bilaterally. Electronically Signed   By: San Morelle M.D.   On: 04/12/2016 18:55   Mr Brain Wo Contrast  Result Date: 04/12/2016 CLINICAL DATA:  Syncopal episodes today. First episode began after mowing the lawn. Patient then had right upper extremity weakness, right facial droop, and slurred speech. The symptoms have since resolved. EXAM: MRI HEAD WITHOUT CONTRAST MRA HEAD WITHOUT CONTRAST TECHNIQUE: Multiplanar, multiecho pulse sequences of the brain and surrounding structures were obtained without intravenous contrast. Angiographic images of the head were obtained using MRA technique without contrast. COMPARISON:  None. FINDINGS: MRI HEAD FINDINGS The diffusion-weighted images demonstrate no acute or subacute infarct. There are remote infarcts involving the medial occipital lobe bilaterally. Mild generalized atrophy and periventricular white matter T2 hyperintensities are within normal limits for age. The ventricles are proportionate to the degree of atrophy. No significant extra-axial fluid collection is present. Internal auditory canals are within normal limits bilaterally. Flow is present in the major intracranial arteries. Bilateral lens replacements  are present. There is scleral banding on the left. The globes and orbits are otherwise intact. The paranasal sinuses and mastoid air cells are clear. MRA HEAD FINDINGS Internal carotid arteries demonstrate mild tortuosity in the cervical segments bilaterally. There is mild narrowing of the distal right ICA just proximal to the ICA terminus. The left internal carotid artery is intact through the ICA terminus. Moderate proximal stenoses are present in the right A1 and M1 segment. The left A1 segment is dominant with epi and anterior communicating artery. There is mild narrowing of the left M1 segment proximally. The MCA bifurcations are intact bilaterally. ACA and MCA branch vessels are intact. There is mild distal narrowing, within normal limits for age. The right vertebral artery essentially terminates at the PICA. The left vertebral artery is the dominant vessel. There is a moderate to high-grade stenosis in the mid basilar artery. Both posterior cerebral arteries originate from the basilar tip. There is moderate attenuation of PCA branch vessels bilaterally IMPRESSION: 1. No acute infarct. 2. Remote bilateral occipital lobe infarcts. 3. Mild atrophy and white matter disease is otherwise within normal limits for age. 4. Moderate to high-grade  stenoses of the proximal M1 and A1 segments. 5. Severe mid basilar stenosis. 6. Moderate segmental narrowing within the posterior cerebral arteries bilaterally. Electronically Signed   By: San Morelle M.D.   On: 04/12/2016 18:55   Ct Head Code Stroke W/o Cm  Result Date: 04/12/2016 CLINICAL DATA:  Code stroke. New onset slurred speech and right arm weakness. EXAM: CT HEAD WITHOUT CONTRAST TECHNIQUE: Contiguous axial images were obtained from the base of the skull through the vertex without intravenous contrast. COMPARISON:  03/26/2016 FINDINGS: There is no evidence of acute cortical infarct, intracranial hemorrhage, mass, midline shift, or extra-axial fluid  collection. Mild cerebral atrophy is within normal limits for age. A small chronic right occipital cortical infarct is unchanged. Prior bilateral cataract extraction and left scleral banding are again noted. There is calcified atherosclerosis at the skullbase. The visualized paranasal sinuses and mastoid air cells are clear. No acute osseous abnormality is identified. ASPECTS [facility] Stroke Program Early CT Score, [email or web address]) - Ganglionic level infarction (caudate, lentiform nuclei, internal capsule, insula, M1-M3 cortex): 7 - Supraganglionic infarction (M4-M6 cortex): 3 Total score (0-10 with 10 being normal): 10 IMPRESSION: 1. No evidence of acute intracranial abnormality. 2. ASPECTS score 10. 3. Chronic right occipital infarct. These results were called by telephone at the time of interpretation on 04/12/2016 at 4:13 pm to Dr. Shon Hale, who verbally acknowledged these results. Electronically Signed   By: Logan Bores M.D.   On: 04/12/2016 16:15    Micro Results    No results found for this or any previous visit (from the past 240 hour(s)).     Today   Subjective:   Temitayo Parkhouse today has no headache,no chest abdominal pain,no new weakness tingling or numbness, feels much better wants to go home today.   Objective:   Blood pressure (!) 149/68, pulse 70, temperature 98.4 F (36.9 C), temperature source Oral, resp. rate 14, height 5\' 11"  (1.803 m), weight 83.1 kg (183 lb 4.8 oz), SpO2 96 %.  No intake or output data in the 24 hours ending 04/14/16 1634  Exam Awake Alert, Oriented x 3, No new F.N deficits, Normal affect [place].AT,PERRAL Supple Neck,No JVD, No cervical lymphadenopathy appriciated.  Symmetrical Chest wall movement, Good air movement bilaterally, CTAB RRR,No Gallops,Rubs or new Murmurs, No Parasternal Heave +ve B.Sounds, Abd Soft, Non tender, No organomegaly appriciated, No rebound -guarding or rigidity. No Cyanosis, Clubbing or edema, No new Rash or  bruise  Data Review   CBC w Diff:  Lab Results  Component Value Date   WBC 7.5 04/12/2016   HGB 13.6 04/12/2016   HCT 40.0 04/12/2016   PLT 149 (L) 04/12/2016   LYMPHOPCT 12 04/12/2016   MONOPCT 7 04/12/2016   EOSPCT 1 04/12/2016   BASOPCT 0 04/12/2016    CMP:  Lab Results  Component Value Date   NA 142 04/12/2016   K 4.3 04/12/2016   CL 103 04/12/2016   CO2 27 04/12/2016   BUN 19 04/12/2016   CREATININE 0.80 04/12/2016   PROT 6.0 (L) 04/12/2016   ALBUMIN 3.6 04/12/2016   BILITOT 0.9 04/12/2016   ALKPHOS 70 04/12/2016   AST 21 04/12/2016   ALT 21 04/12/2016  .   Total Time in preparing paper work, data evaluation and todays exam - 35 minutes  Jashayla Glatfelter M.D on 04/14/2016 at 4:34 PM  [facility]   Office  [phone]

## 2016-04-14 NOTE — Care Management Obs Status (Signed)
MEDICARE OBSERVATION STATUS NOTIFICATION   Patient Details  Name: James Blevins MRN: [id number] Date of Birth: 1945-10-03   Medicare Observation Status Notification Given:  Yes (MRI negative)    Pollie Friar, RN 04/14/2016, 4:06 PM

## 2016-04-15 LAB — INTRINSIC FACTOR ANTIBODIES: INTRINSIC FACTOR: 17.5 [AU]/ml — AB (ref 0.0–1.1)

## 2016-04-15 LAB — ANTI-PARIETAL ANTIBODY: PARIETAL CELL ANTIBODY-IGG: 27.8 U — AB (ref 0.0–20.0)

## 2016-04-16 DIAGNOSIS — R55 Syncope and collapse: Secondary | ICD-10-CM | POA: Diagnosis not present

## 2016-04-16 DIAGNOSIS — D510 Vitamin B12 deficiency anemia due to intrinsic factor deficiency: Secondary | ICD-10-CM | POA: Diagnosis not present

## 2016-05-13 DIAGNOSIS — R55 Syncope and collapse: Secondary | ICD-10-CM | POA: Diagnosis not present

## 2016-05-13 DIAGNOSIS — D510 Vitamin B12 deficiency anemia due to intrinsic factor deficiency: Secondary | ICD-10-CM | POA: Diagnosis not present

## 2016-05-13 DIAGNOSIS — I10 Essential (primary) hypertension: Secondary | ICD-10-CM | POA: Diagnosis not present

## 2016-05-13 DIAGNOSIS — E11319 Type 2 diabetes mellitus with unspecified diabetic retinopathy without macular edema: Secondary | ICD-10-CM | POA: Diagnosis not present

## 2016-05-13 DIAGNOSIS — N289 Disorder of kidney and ureter, unspecified: Secondary | ICD-10-CM | POA: Diagnosis not present

## 2016-05-13 DIAGNOSIS — I639 Cerebral infarction, unspecified: Secondary | ICD-10-CM | POA: Diagnosis not present

## 2016-05-13 DIAGNOSIS — E784 Other hyperlipidemia: Secondary | ICD-10-CM | POA: Diagnosis not present

## 2016-06-20 DIAGNOSIS — R55 Syncope and collapse: Secondary | ICD-10-CM | POA: Diagnosis not present

## 2016-06-20 DIAGNOSIS — E784 Other hyperlipidemia: Secondary | ICD-10-CM | POA: Diagnosis not present

## 2016-06-20 DIAGNOSIS — Z6825 Body mass index (BMI) 25.0-25.9, adult: Secondary | ICD-10-CM | POA: Diagnosis not present

## 2016-06-20 DIAGNOSIS — E11319 Type 2 diabetes mellitus with unspecified diabetic retinopathy without macular edema: Secondary | ICD-10-CM | POA: Diagnosis not present

## 2016-06-20 DIAGNOSIS — D510 Vitamin B12 deficiency anemia due to intrinsic factor deficiency: Secondary | ICD-10-CM | POA: Diagnosis not present

## 2016-06-20 DIAGNOSIS — E785 Hyperlipidemia, unspecified: Secondary | ICD-10-CM | POA: Diagnosis not present

## 2016-06-20 DIAGNOSIS — I10 Essential (primary) hypertension: Secondary | ICD-10-CM | POA: Diagnosis not present

## 2016-07-09 DIAGNOSIS — E113493 Type 2 diabetes mellitus with severe nonproliferative diabetic retinopathy without macular edema, bilateral: Secondary | ICD-10-CM | POA: Diagnosis not present

## 2016-07-09 DIAGNOSIS — E113412 Type 2 diabetes mellitus with severe nonproliferative diabetic retinopathy with macular edema, left eye: Secondary | ICD-10-CM | POA: Diagnosis not present

## 2016-07-09 DIAGNOSIS — H359 Unspecified retinal disorder: Secondary | ICD-10-CM | POA: Diagnosis not present

## 2016-07-09 DIAGNOSIS — H26492 Other secondary cataract, left eye: Secondary | ICD-10-CM | POA: Diagnosis not present

## 2016-07-09 DIAGNOSIS — E113411 Type 2 diabetes mellitus with severe nonproliferative diabetic retinopathy with macular edema, right eye: Secondary | ICD-10-CM | POA: Diagnosis not present

## 2016-07-22 DIAGNOSIS — E113412 Type 2 diabetes mellitus with severe nonproliferative diabetic retinopathy with macular edema, left eye: Secondary | ICD-10-CM | POA: Diagnosis not present

## 2016-07-22 DIAGNOSIS — E113411 Type 2 diabetes mellitus with severe nonproliferative diabetic retinopathy with macular edema, right eye: Secondary | ICD-10-CM | POA: Diagnosis not present

## 2016-08-22 DIAGNOSIS — H5213 Myopia, bilateral: Secondary | ICD-10-CM | POA: Diagnosis not present

## 2016-08-25 DIAGNOSIS — E113412 Type 2 diabetes mellitus with severe nonproliferative diabetic retinopathy with macular edema, left eye: Secondary | ICD-10-CM | POA: Diagnosis not present

## 2016-08-25 DIAGNOSIS — H43812 Vitreous degeneration, left eye: Secondary | ICD-10-CM | POA: Diagnosis not present

## 2016-08-25 DIAGNOSIS — E113493 Type 2 diabetes mellitus with severe nonproliferative diabetic retinopathy without macular edema, bilateral: Secondary | ICD-10-CM | POA: Diagnosis not present

## 2016-08-25 DIAGNOSIS — H359 Unspecified retinal disorder: Secondary | ICD-10-CM | POA: Diagnosis not present

## 2016-09-03 DIAGNOSIS — E113412 Type 2 diabetes mellitus with severe nonproliferative diabetic retinopathy with macular edema, left eye: Secondary | ICD-10-CM | POA: Diagnosis not present

## 2016-10-22 DIAGNOSIS — K921 Melena: Secondary | ICD-10-CM | POA: Diagnosis not present

## 2016-10-22 DIAGNOSIS — E11319 Type 2 diabetes mellitus with unspecified diabetic retinopathy without macular edema: Secondary | ICD-10-CM | POA: Diagnosis not present

## 2016-10-22 DIAGNOSIS — D510 Vitamin B12 deficiency anemia due to intrinsic factor deficiency: Secondary | ICD-10-CM | POA: Diagnosis not present

## 2016-10-22 DIAGNOSIS — R52 Pain, unspecified: Secondary | ICD-10-CM | POA: Diagnosis not present

## 2016-11-28 DIAGNOSIS — N400 Enlarged prostate without lower urinary tract symptoms: Secondary | ICD-10-CM | POA: Diagnosis not present

## 2016-11-28 DIAGNOSIS — N5201 Erectile dysfunction due to arterial insufficiency: Secondary | ICD-10-CM | POA: Diagnosis not present

## 2017-01-06 DIAGNOSIS — L821 Other seborrheic keratosis: Secondary | ICD-10-CM | POA: Diagnosis not present

## 2017-01-06 DIAGNOSIS — Z85828 Personal history of other malignant neoplasm of skin: Secondary | ICD-10-CM | POA: Diagnosis not present

## 2017-01-06 DIAGNOSIS — Z85820 Personal history of malignant melanoma of skin: Secondary | ICD-10-CM | POA: Diagnosis not present

## 2017-01-06 DIAGNOSIS — L820 Inflamed seborrheic keratosis: Secondary | ICD-10-CM | POA: Diagnosis not present

## 2017-01-14 DIAGNOSIS — Z125 Encounter for screening for malignant neoplasm of prostate: Secondary | ICD-10-CM | POA: Diagnosis not present

## 2017-01-14 DIAGNOSIS — I10 Essential (primary) hypertension: Secondary | ICD-10-CM | POA: Diagnosis not present

## 2017-01-14 DIAGNOSIS — E11319 Type 2 diabetes mellitus with unspecified diabetic retinopathy without macular edema: Secondary | ICD-10-CM | POA: Diagnosis not present

## 2017-01-14 DIAGNOSIS — D510 Vitamin B12 deficiency anemia due to intrinsic factor deficiency: Secondary | ICD-10-CM | POA: Diagnosis not present

## 2017-01-22 DIAGNOSIS — Z0000 Encounter for general adult medical examination without abnormal findings: Secondary | ICD-10-CM | POA: Diagnosis not present

## 2017-01-22 DIAGNOSIS — I638 Other cerebral infarction: Secondary | ICD-10-CM | POA: Diagnosis not present

## 2017-01-22 DIAGNOSIS — H3500 Unspecified background retinopathy: Secondary | ICD-10-CM | POA: Diagnosis not present

## 2017-01-22 DIAGNOSIS — E1159 Type 2 diabetes mellitus with other circulatory complications: Secondary | ICD-10-CM | POA: Diagnosis not present

## 2017-01-26 DIAGNOSIS — Z1212 Encounter for screening for malignant neoplasm of rectum: Secondary | ICD-10-CM | POA: Diagnosis not present

## 2017-02-16 ENCOUNTER — Emergency Department (HOSPITAL_COMMUNITY)
Admission: EM | Admit: 2017-02-16 | Discharge: 2017-02-16 | Disposition: A | Discharge status: Home / Self Care | Payer: Medicare Other | Attending: Emergency Medicine | Admitting: Emergency Medicine

## 2017-02-16 ENCOUNTER — Encounter (HOSPITAL_COMMUNITY): Payer: Self-pay

## 2017-02-16 ENCOUNTER — Emergency Department (HOSPITAL_COMMUNITY): Payer: Medicare Other

## 2017-02-16 DIAGNOSIS — Z8673 Personal history of transient ischemic attack (TIA), and cerebral infarction without residual deficits: Secondary | ICD-10-CM | POA: Diagnosis not present

## 2017-02-16 DIAGNOSIS — Z85828 Personal history of other malignant neoplasm of skin: Secondary | ICD-10-CM | POA: Insufficient documentation

## 2017-02-16 DIAGNOSIS — E860 Dehydration: Secondary | ICD-10-CM | POA: Diagnosis not present

## 2017-02-16 DIAGNOSIS — Z7982 Long term (current) use of aspirin: Secondary | ICD-10-CM | POA: Diagnosis not present

## 2017-02-16 DIAGNOSIS — Z7984 Long term (current) use of oral hypoglycemic drugs: Secondary | ICD-10-CM | POA: Diagnosis not present

## 2017-02-16 DIAGNOSIS — E11319 Type 2 diabetes mellitus with unspecified diabetic retinopathy without macular edema: Secondary | ICD-10-CM | POA: Insufficient documentation

## 2017-02-16 DIAGNOSIS — Z79899 Other long term (current) drug therapy: Secondary | ICD-10-CM | POA: Insufficient documentation

## 2017-02-16 DIAGNOSIS — R404 Transient alteration of awareness: Secondary | ICD-10-CM | POA: Diagnosis not present

## 2017-02-16 DIAGNOSIS — R55 Syncope and collapse: Secondary | ICD-10-CM | POA: Insufficient documentation

## 2017-02-16 DIAGNOSIS — R42 Dizziness and giddiness: Secondary | ICD-10-CM | POA: Diagnosis not present

## 2017-02-16 LAB — BASIC METABOLIC PANEL
ANION GAP: 8 (ref 5–15)
BUN: 19 mg/dL (ref 6–20)
CALCIUM: 8.7 mg/dL — AB (ref 8.9–10.3)
CO2: 26 mmol/L (ref 22–32)
Chloride: 102 mmol/L (ref 101–111)
Creatinine, Ser: 0.98 mg/dL (ref 0.61–1.24)
GFR calc Af Amer: >60 mL/min (ref >60)
GLUCOSE: 296 mg/dL — AB (ref 65–99)
Potassium: 4.2 mmol/L (ref 3.5–5.1)
SODIUM: 136 mmol/L (ref 135–145)

## 2017-02-16 LAB — CBC WITH DIFFERENTIAL/PLATELET
BASOS ABS: 0 10*3/uL (ref 0.0–0.1)
Basophils Relative: 0 %
EOS ABS: 0.1 10*3/uL (ref 0.0–0.7)
Eosinophils Relative: 1 %
HCT: 41.5 % (ref 39.0–52.0)
Hemoglobin: 14.4 g/dL (ref 13.0–17.0)
LYMPHS PCT: 8 %
Lymphs Abs: 0.7 10*3/uL (ref 0.7–4.0)
MCH: 30.1 pg (ref 26.0–34.0)
MCHC: 34.7 g/dL (ref 30.0–36.0)
MCV: 86.8 fL (ref 78.0–100.0)
MONO ABS: 0.5 10*3/uL (ref 0.1–1.0)
Monocytes Relative: 6 %
Neutro Abs: 7.2 10*3/uL (ref 1.7–7.7)
Neutrophils Relative %: 85 %
PLATELETS: 139 10*3/uL — AB (ref 150–400)
RBC: 4.78 MIL/uL (ref 4.22–5.81)
RDW: 12.8 % (ref 11.5–15.5)
WBC: 8.5 10*3/uL (ref 4.0–10.5)

## 2017-02-16 LAB — TROPONIN I: Troponin I: <0.03 ng/mL (ref <0.03)

## 2017-02-16 LAB — URINALYSIS, ROUTINE W REFLEX MICROSCOPIC
Bacteria, UA: NONE SEEN
Bilirubin Urine: NEGATIVE
HGB URINE DIPSTICK: NEGATIVE
Ketones, ur: 5 mg/dL — AB
Leukocytes, UA: NEGATIVE
Nitrite: NEGATIVE
Protein, ur: NEGATIVE mg/dL
Specific Gravity, Urine: 1.015 (ref 1.005–1.030)
pH: 5 (ref 5.0–8.0)

## 2017-02-16 MED ORDER — SODIUM CHLORIDE 0.9 % IV BOLUS (SEPSIS)
500.0000 mL | Freq: Once | INTRAVENOUS | Status: AC
  Administered 2017-02-16: 500 mL via INTRAVENOUS

## 2017-02-16 MED ORDER — SODIUM CHLORIDE 0.9 % IV BOLUS (SEPSIS)
1000.0000 mL | Freq: Once | INTRAVENOUS | Status: AC
  Administered 2017-02-16: 1000 mL via INTRAVENOUS

## 2017-02-16 NOTE — ED Notes (Signed)
Patient transported to CT 

## 2017-02-16 NOTE — ED Notes (Signed)
Pt to CT

## 2017-02-16 NOTE — ED Provider Notes (Signed)
[facility] DEPT Provider Note   CSN: [id number] Arrival date & time: 02/16/17  1056     History   Chief Complaint Chief Complaint  Patient presents with  . Loss of Consciousness    HPI James Blevins is a 71 y.o. male who presents via EMS with syncopal  that occurred this morning. Patient states that this morning after taking a shower he got out of the shower and felt lightheaded/dizzy and attempted to state he himself but sitting on the bathtub rim. Patient states that he does not recall what happened afterwards. Per EMS, grandson came up and found patient on floor with positive LOC. Unsure of how long he was down. He reports 6 days ago he had some rhinorrhea, nasal congestion, cough for which she was taking over-the-counter medication for. Patient notes that in the last few days he has felt very fatigued and has had decreased PO secondary to symptoms. He reports that on 02/13/17 he only ate one packet of crackers the entire day. That night, he notes that he was attending his grandson';s graduation in a very hot room and had some similar pre-syncope symptoms that were exacerbated when he tried to stand up. No symptoms at rest. Patient is on aspirin and Plavix. Patient denies any chest pain, difficulty breathing, abdominal pain, nausea/vomiting, fever, productive cough, numbness/weakness of his arms or legs, neck pain, back pain.  The history is provided by the patient.    Past Medical History:  Diagnosis Date  . Cataracts, bilateral   . Diabetes ([place])   . Retinal detachment left eye, 08/2013  . Skin cancer   . Stroke ([place])   . Stroke due to embolism ([place]) 01/14/2016    Patient Active Problem List   Diagnosis Date Noted  . TIA (transient ischemic attack) 04/12/2016  . Syncope 04/12/2016  . Orthostatic hypotension 03/27/2016  . Dehydration 03/27/2016  . AKI (acute kidney injury) ([place]) 03/27/2016  . Near syncope 03/26/2016  . Stroke due to embolism ([place]) 01/14/2016  . Type 2  diabetes mellitus with retinopathy of both eyes, without long-term current use of insulin ([place]) 01/14/2016  . Essential hypertension 01/14/2016  . Patent foramen ovale 01/14/2016  . History of skin cancer 01/14/2016    Past Surgical History:  Procedure Laterality Date  . COLONOSCOPY  2002  . EYE SURGERY    . skin cancer removal    . TONSILLECTOMY  childhood       Home Medications    Prior to Admission medications   Medication Sig Start Date End Date Taking? Authorizing Provider  aspirin 325 MG tablet Take 1 tablet (325 mg total) by mouth daily. 04/14/16   Elgergawy, Silver Huguenin, MD  atorvastatin (LIPITOR) 80 MG tablet Take 80 mg by mouth daily.    [provider]  clopidogrel (PLAVIX) 75 MG tablet Take 75 mg by mouth daily.    [provider]  cyanocobalamin (,VITAMIN B-12,) 1000 MCG/ML injection Please take once daily for next 7 days, then once weekly for 4 weeks, then once monthly. 04/14/16   Elgergawy, Silver Huguenin, MD  glipiZIDE (GLUCOTROL) 5 MG tablet Take 0.5 tablets (2.5 mg total) by mouth 2 (two) times daily before a meal. Please take for 2 days, then stop once or you resumed her Janumet back 04/14/16   Elgergawy, Silver Huguenin, MD  sitaGLIPtin-metformin (JANUMET) 50-1000 MG tablet Take 1 tablet by mouth 2 (two) times daily with a meal. Please hold for next 2 days as you have received IV contrast  during hospital stay, resume on 04/17/2016 04/17/16   Elgergawy, Silver Huguenin, MD    Family History Family History  Problem Relation Age of Onset  . Clotting disorder Mother        heart attack  . Stroke Maternal Grandfather        stroke  . Clotting disorder Daughter        prothrombin gene mutation  . Colon cancer Neg Hx   . Esophageal cancer Neg Hx   . Rectal cancer Neg Hx   . Stomach cancer Neg Hx     Social History Social History  Substance Use Topics  . Smoking status: Never Smoker  . Smokeless tobacco: Never Used  . Alcohol use Yes     Comment: rare      Allergies   Patient has no known allergies.   Review of Systems Review of Systems  Constitutional: Negative for chills and fever.  HENT: Negative for sore throat.   Eyes: Positive for visual disturbance (Chronic).  Respiratory: Positive for cough. Negative for shortness of breath.   Cardiovascular: Negative for chest pain.  Gastrointestinal: Negative for abdominal pain, diarrhea, nausea and vomiting.  Genitourinary: Negative for dysuria and hematuria.  Musculoskeletal: Negative for back pain and neck pain.  Neurological: Positive for dizziness, syncope and light-headedness. Negative for weakness, numbness and headaches.  All other systems reviewed and are negative.    Physical Exam Updated Vital Signs BP (!) 167/81   Pulse 74   Temp 97.7 F (36.5 C) (Oral)   Resp 13   SpO2 96%   Physical Exam  Constitutional: He is oriented to person, place, and time. He appears well-developed and well-nourished.  Sitting comfortably on examination table  HENT:  Head: Normocephalic and atraumatic.  Mouth/Throat: Oropharynx is clear and moist. Mucous membranes are dry.  Eyes: Conjunctivae, EOM and lids are normal. Pupils are equal, round, and reactive to light.  Neck: Full passive range of motion without pain.  Full flexion/extension and lateral movement of neck fully intact. No bony midline tenderness. No deformities or crepitus.   Cardiovascular: Normal rate, regular rhythm, normal heart sounds and normal pulses.  Exam reveals no gallop and no friction rub.   No murmur heard. Pulmonary/Chest: Effort normal and breath sounds normal.  Abdominal: Soft. Normal appearance and bowel sounds are normal. There is no tenderness. There is no rigidity and no guarding.  Musculoskeletal: Normal range of motion.       Thoracic back: He exhibits no tenderness.       Lumbar back: He exhibits no tenderness.  Neurological: He is alert and oriented to person, place, and time. GCS eye subscore is 4.  GCS verbal subscore is 5. GCS motor subscore is 6.  Cranial nerves III-XII intact Follows commands, Moves all extremities  5/5 strength to BUE and BLE  Sensation intact throughout  Normal finger to nose. No dysdiadochokinesia. No pronator drift. No slurred speech. No facial droop.   Skin: Skin is warm and dry. Capillary refill takes less than 2 seconds.  Psychiatric: He has a normal mood and affect. His speech is normal.  Nursing note and vitals reviewed.    ED Treatments / Results  Labs (all labs ordered are listed, but only abnormal results are displayed) Labs Reviewed  CBC WITH DIFFERENTIAL/PLATELET - Abnormal; Notable for the following:       Result Value   Platelets 139 (*)    All other components within normal limits  BASIC METABOLIC PANEL - Abnormal; Notable for  the following:    Glucose, Bld 296 (*)    Calcium 8.7 (*)    All other components within normal limits  URINALYSIS, ROUTINE W REFLEX MICROSCOPIC - Abnormal; Notable for the following:    Glucose, UA >=500 (*)    Ketones, ur 5 (*)    Squamous Epithelial / LPF 0-5 (*)    All other components within normal limits  TROPONIN I    EKG  EKG Interpretation  Date/Time:  Monday February 16 2017 11:12:11 EDT Ventricular Rate:  63 PR Interval:    QRS Duration: 106 QT Interval:  439 QTC Calculation: 450 R Axis:   65 Text Interpretation:  Sinus rhythm No significant change since last tracing Confirmed by Isla Pence [phone]) on 02/16/2017 11:54:29 AM       Radiology Ct Head Wo Contrast  Result Date: 02/16/2017 CLINICAL DATA:  Syncope. EXAM: CT HEAD WITHOUT CONTRAST TECHNIQUE: Contiguous axial images were obtained from the base of the skull through the vertex without intravenous contrast. COMPARISON:  CT scan of April 12, 2016. FINDINGS: Brain: Mild diffuse cortical atrophy is noted. No mass effect or midline shift is noted. Ventricular size is within normal limits. There is no evidence of mass lesion, hemorrhage or  acute infarction. Vascular: Atherosclerosis of carotid siphons is noted. Skull: Normal. Negative for fracture or focal lesion. Sinuses/Orbits: Mild bilateral ethmoid and left maxillary sinusitis is noted. Other: None. IMPRESSION: Mild diffuse cortical atrophy. No acute intracranial abnormality seen. Electronically Signed   By: Marijo Conception, M.D.   On: 02/16/2017 11:44    Procedures Procedures (including critical care time)  Medications Ordered in ED Medications  sodium chloride 0.9 % bolus 500 mL (0 mLs Intravenous Stopped 02/16/17 1249)  sodium chloride 0.9 % bolus 1,000 mL (0 mLs Intravenous Stopped 02/16/17 1408)     Initial Impression / Assessment and Plan / ED Course  I have reviewed the triage vital signs and the nursing notes.  Pertinent labs & imaging results that were available during my care of the patient were reviewed by me and considered in my medical decision making (see chart for details).     70 year old male who presents with syncopal episode this morning. Has been feeling fatigued and had decreased by PO for the last several days secondary to recent sickness. Recently sick with dry cough, congestion, rhinorrhea that has improved. No neuro deficits on exam. Consider dehydration versus orthostatic hypotension. Low suspicion for acute ICH. Plan to check basic labs including CBC, BMP, UA. Will also evaluate CT head given that patient had a fall and is on blood thinners. IVF given for fluid resuscitation. Check orthostatics after fluid resuscitation.  CT head reviewed. Negative for any acute abnormality. EKG reviewed. Normal sinus rhythm rate 63. CBC with normal white blood cell count and H&H. Initial low platelets but records reviewed consistent with prior. BMP shows elevated blood glucose at 296. BUN/creatinine within normal limits. Electrolytes otherwise within normal limits. UA is pending. Troponin pending.  Troponin negative. UA negative for any acute infection. Discussed  results with patient and family. Patient reports improvement in symptoms. He has been able to tolerate PO  in the department. Orthostatic vitals were negative.  Patient stable for discharge at this time. Discussed plan with patient and family. Instructed patient to follow-up with his primary care doctor next 24-48 hours. Strict return precautions discussed. Patient expresses understanding and agreement to plan.  Orthostatic VS for the past 24 hrs:  BP- Lying Pulse- Lying BP- Sitting Pulse-  Sitting BP- Standing at 0 minutes Pulse- Standing at 0 minutes  02/16/17 1408 147/78 72 138/85 81 137/80 85       Final Clinical Impressions(s) / ED Diagnoses   Final diagnoses:  Dehydration  Syncope, unspecified syncope type    New Prescriptions New Prescriptions   No medications on file     Desma Mcgregor 02/16/17 Carita Pian, MD 02/17/17 [id number]

## 2017-02-16 NOTE — ED Triage Notes (Signed)
Pt presents via [facility] for evaluation of single syncopal event this AM. Pt reports URI recently and taking over the counter medicines. Reports poor PO intake. Family found patient on floor unconscious. AxO x4 on arrival to ED, denies pain. On plavix.

## 2017-02-16 NOTE — Discharge Instructions (Signed)
Make sure drinking plenty of fluids and stay hydrated.  Follow-up with her primary care doctor in the next 24-48 hours for further evaluation.  Return to the emergency department for any worsening dizziness, lightheadedness, weakness, chest pain, difficulty breathing, vomiting, difficulty walking, numbness/weakness of her arms or legs or any other worsening or concerning symptoms.

## 2017-04-01 DIAGNOSIS — I639 Cerebral infarction, unspecified: Secondary | ICD-10-CM | POA: Diagnosis not present

## 2017-04-01 DIAGNOSIS — I10 Essential (primary) hypertension: Secondary | ICD-10-CM | POA: Diagnosis not present

## 2017-04-01 DIAGNOSIS — R55 Syncope and collapse: Secondary | ICD-10-CM | POA: Diagnosis not present

## 2017-04-01 DIAGNOSIS — E1159 Type 2 diabetes mellitus with other circulatory complications: Secondary | ICD-10-CM | POA: Diagnosis not present

## 2017-04-06 DIAGNOSIS — E113412 Type 2 diabetes mellitus with severe nonproliferative diabetic retinopathy with macular edema, left eye: Secondary | ICD-10-CM | POA: Diagnosis not present

## 2017-04-06 DIAGNOSIS — H26492 Other secondary cataract, left eye: Secondary | ICD-10-CM | POA: Diagnosis not present

## 2017-04-06 DIAGNOSIS — E113411 Type 2 diabetes mellitus with severe nonproliferative diabetic retinopathy with macular edema, right eye: Secondary | ICD-10-CM | POA: Diagnosis not present

## 2017-04-06 DIAGNOSIS — H26491 Other secondary cataract, right eye: Secondary | ICD-10-CM | POA: Diagnosis not present

## 2017-04-08 DIAGNOSIS — E113412 Type 2 diabetes mellitus with severe nonproliferative diabetic retinopathy with macular edema, left eye: Secondary | ICD-10-CM | POA: Diagnosis not present

## 2017-04-20 DIAGNOSIS — E113412 Type 2 diabetes mellitus with severe nonproliferative diabetic retinopathy with macular edema, left eye: Secondary | ICD-10-CM | POA: Diagnosis not present

## 2017-08-21 DIAGNOSIS — H52223 Regular astigmatism, bilateral: Secondary | ICD-10-CM | POA: Diagnosis not present

## 2017-08-21 DIAGNOSIS — H3500 Unspecified background retinopathy: Secondary | ICD-10-CM | POA: Diagnosis not present

## 2017-08-21 DIAGNOSIS — E7849 Other hyperlipidemia: Secondary | ICD-10-CM | POA: Diagnosis not present

## 2017-08-21 DIAGNOSIS — E1159 Type 2 diabetes mellitus with other circulatory complications: Secondary | ICD-10-CM | POA: Diagnosis not present

## 2017-08-21 DIAGNOSIS — I6389 Other cerebral infarction: Secondary | ICD-10-CM | POA: Diagnosis not present

## 2017-11-16 DIAGNOSIS — E1165 Type 2 diabetes mellitus with hyperglycemia: Secondary | ICD-10-CM | POA: Diagnosis not present

## 2018-01-11 DIAGNOSIS — H3500 Unspecified background retinopathy: Secondary | ICD-10-CM | POA: Diagnosis not present

## 2018-01-11 DIAGNOSIS — I639 Cerebral infarction, unspecified: Secondary | ICD-10-CM | POA: Diagnosis not present

## 2018-01-11 DIAGNOSIS — I10 Essential (primary) hypertension: Secondary | ICD-10-CM | POA: Diagnosis not present

## 2018-01-11 DIAGNOSIS — E1159 Type 2 diabetes mellitus with other circulatory complications: Secondary | ICD-10-CM | POA: Diagnosis not present

## 2018-02-08 DIAGNOSIS — E7849 Other hyperlipidemia: Secondary | ICD-10-CM | POA: Diagnosis not present

## 2018-02-08 DIAGNOSIS — E11319 Type 2 diabetes mellitus with unspecified diabetic retinopathy without macular edema: Secondary | ICD-10-CM | POA: Diagnosis not present

## 2018-02-08 DIAGNOSIS — D510 Vitamin B12 deficiency anemia due to intrinsic factor deficiency: Secondary | ICD-10-CM | POA: Diagnosis not present

## 2018-02-08 DIAGNOSIS — R82998 Other abnormal findings in urine: Secondary | ICD-10-CM | POA: Diagnosis not present

## 2018-02-08 DIAGNOSIS — Z125 Encounter for screening for malignant neoplasm of prostate: Secondary | ICD-10-CM | POA: Diagnosis not present

## 2018-02-08 DIAGNOSIS — I10 Essential (primary) hypertension: Secondary | ICD-10-CM | POA: Diagnosis not present

## 2018-02-11 DIAGNOSIS — E1159 Type 2 diabetes mellitus with other circulatory complications: Secondary | ICD-10-CM | POA: Diagnosis not present

## 2018-02-11 DIAGNOSIS — H3500 Unspecified background retinopathy: Secondary | ICD-10-CM | POA: Diagnosis not present

## 2018-02-11 DIAGNOSIS — L738 Other specified follicular disorders: Secondary | ICD-10-CM | POA: Diagnosis not present

## 2018-02-11 DIAGNOSIS — Z0000 Encounter for general adult medical examination without abnormal findings: Secondary | ICD-10-CM | POA: Diagnosis not present

## 2018-03-02 DIAGNOSIS — C44311 Basal cell carcinoma of skin of nose: Secondary | ICD-10-CM | POA: Diagnosis not present

## 2018-03-02 DIAGNOSIS — C44329 Squamous cell carcinoma of skin of other parts of face: Secondary | ICD-10-CM | POA: Diagnosis not present

## 2018-03-02 DIAGNOSIS — L570 Actinic keratosis: Secondary | ICD-10-CM | POA: Diagnosis not present

## 2018-03-02 DIAGNOSIS — L821 Other seborrheic keratosis: Secondary | ICD-10-CM | POA: Diagnosis not present

## 2018-03-02 DIAGNOSIS — Z85828 Personal history of other malignant neoplasm of skin: Secondary | ICD-10-CM | POA: Diagnosis not present

## 2018-03-02 DIAGNOSIS — C44319 Basal cell carcinoma of skin of other parts of face: Secondary | ICD-10-CM | POA: Diagnosis not present

## 2018-03-02 DIAGNOSIS — D045 Carcinoma in situ of skin of trunk: Secondary | ICD-10-CM | POA: Diagnosis not present

## 2018-03-02 DIAGNOSIS — Z85820 Personal history of malignant melanoma of skin: Secondary | ICD-10-CM | POA: Diagnosis not present

## 2018-03-12 DIAGNOSIS — M792 Neuralgia and neuritis, unspecified: Secondary | ICD-10-CM | POA: Diagnosis not present

## 2018-03-12 DIAGNOSIS — Z6826 Body mass index (BMI) 26.0-26.9, adult: Secondary | ICD-10-CM | POA: Diagnosis not present

## 2018-03-12 DIAGNOSIS — E1159 Type 2 diabetes mellitus with other circulatory complications: Secondary | ICD-10-CM | POA: Diagnosis not present

## 2018-03-12 DIAGNOSIS — I10 Essential (primary) hypertension: Secondary | ICD-10-CM | POA: Diagnosis not present

## 2018-03-29 DIAGNOSIS — Z85820 Personal history of malignant melanoma of skin: Secondary | ICD-10-CM | POA: Diagnosis not present

## 2018-03-29 DIAGNOSIS — Z85828 Personal history of other malignant neoplasm of skin: Secondary | ICD-10-CM | POA: Diagnosis not present

## 2018-03-29 DIAGNOSIS — C44311 Basal cell carcinoma of skin of nose: Secondary | ICD-10-CM | POA: Diagnosis not present

## 2018-03-29 DIAGNOSIS — C44319 Basal cell carcinoma of skin of other parts of face: Secondary | ICD-10-CM | POA: Diagnosis not present

## 2018-05-24 DIAGNOSIS — E1159 Type 2 diabetes mellitus with other circulatory complications: Secondary | ICD-10-CM | POA: Diagnosis not present

## 2018-05-24 DIAGNOSIS — I10 Essential (primary) hypertension: Secondary | ICD-10-CM | POA: Diagnosis not present

## 2018-05-24 DIAGNOSIS — I639 Cerebral infarction, unspecified: Secondary | ICD-10-CM | POA: Diagnosis not present

## 2018-05-24 DIAGNOSIS — E7849 Other hyperlipidemia: Secondary | ICD-10-CM | POA: Diagnosis not present

## 2018-05-31 DIAGNOSIS — E1165 Type 2 diabetes mellitus with hyperglycemia: Secondary | ICD-10-CM | POA: Diagnosis not present

## 2018-07-07 DIAGNOSIS — H43812 Vitreous degeneration, left eye: Secondary | ICD-10-CM | POA: Diagnosis not present

## 2018-07-07 DIAGNOSIS — E113411 Type 2 diabetes mellitus with severe nonproliferative diabetic retinopathy with macular edema, right eye: Secondary | ICD-10-CM | POA: Diagnosis not present

## 2018-07-07 DIAGNOSIS — E113412 Type 2 diabetes mellitus with severe nonproliferative diabetic retinopathy with macular edema, left eye: Secondary | ICD-10-CM | POA: Diagnosis not present

## 2018-07-07 DIAGNOSIS — Z8669 Personal history of other diseases of the nervous system and sense organs: Secondary | ICD-10-CM | POA: Diagnosis not present

## 2018-07-19 DIAGNOSIS — E7849 Other hyperlipidemia: Secondary | ICD-10-CM | POA: Diagnosis not present

## 2018-07-19 DIAGNOSIS — I10 Essential (primary) hypertension: Secondary | ICD-10-CM | POA: Diagnosis not present

## 2018-07-19 DIAGNOSIS — E1159 Type 2 diabetes mellitus with other circulatory complications: Secondary | ICD-10-CM | POA: Diagnosis not present

## 2018-07-19 DIAGNOSIS — I639 Cerebral infarction, unspecified: Secondary | ICD-10-CM | POA: Diagnosis not present

## 2018-07-20 DIAGNOSIS — E113412 Type 2 diabetes mellitus with severe nonproliferative diabetic retinopathy with macular edema, left eye: Secondary | ICD-10-CM | POA: Diagnosis not present

## 2018-07-20 DIAGNOSIS — E113411 Type 2 diabetes mellitus with severe nonproliferative diabetic retinopathy with macular edema, right eye: Secondary | ICD-10-CM | POA: Diagnosis not present

## 2018-08-23 DIAGNOSIS — H52223 Regular astigmatism, bilateral: Secondary | ICD-10-CM | POA: Diagnosis not present

## 2018-08-24 DIAGNOSIS — E113411 Type 2 diabetes mellitus with severe nonproliferative diabetic retinopathy with macular edema, right eye: Secondary | ICD-10-CM | POA: Diagnosis not present

## 2018-08-24 DIAGNOSIS — E113412 Type 2 diabetes mellitus with severe nonproliferative diabetic retinopathy with macular edema, left eye: Secondary | ICD-10-CM | POA: Diagnosis not present

## 2018-08-24 DIAGNOSIS — H359 Unspecified retinal disorder: Secondary | ICD-10-CM | POA: Diagnosis not present

## 2018-08-24 DIAGNOSIS — H43812 Vitreous degeneration, left eye: Secondary | ICD-10-CM | POA: Diagnosis not present

## 2018-08-27 DIAGNOSIS — M545 Low back pain: Secondary | ICD-10-CM | POA: Diagnosis not present

## 2018-08-27 DIAGNOSIS — N50811 Right testicular pain: Secondary | ICD-10-CM | POA: Diagnosis not present

## 2018-08-27 DIAGNOSIS — Z6827 Body mass index (BMI) 27.0-27.9, adult: Secondary | ICD-10-CM | POA: Diagnosis not present

## 2018-09-07 DIAGNOSIS — L570 Actinic keratosis: Secondary | ICD-10-CM | POA: Diagnosis not present

## 2018-09-07 DIAGNOSIS — Z85820 Personal history of malignant melanoma of skin: Secondary | ICD-10-CM | POA: Diagnosis not present

## 2018-09-07 DIAGNOSIS — D045 Carcinoma in situ of skin of trunk: Secondary | ICD-10-CM | POA: Diagnosis not present

## 2018-09-07 DIAGNOSIS — D485 Neoplasm of uncertain behavior of skin: Secondary | ICD-10-CM | POA: Diagnosis not present

## 2018-09-07 DIAGNOSIS — Z85828 Personal history of other malignant neoplasm of skin: Secondary | ICD-10-CM | POA: Diagnosis not present

## 2018-09-30 DIAGNOSIS — I639 Cerebral infarction, unspecified: Secondary | ICD-10-CM | POA: Diagnosis not present

## 2018-09-30 DIAGNOSIS — I10 Essential (primary) hypertension: Secondary | ICD-10-CM | POA: Diagnosis not present

## 2018-09-30 DIAGNOSIS — E1159 Type 2 diabetes mellitus with other circulatory complications: Secondary | ICD-10-CM | POA: Diagnosis not present

## 2018-09-30 DIAGNOSIS — E7849 Other hyperlipidemia: Secondary | ICD-10-CM | POA: Diagnosis not present

## 2018-11-15 DIAGNOSIS — N5201 Erectile dysfunction due to arterial insufficiency: Secondary | ICD-10-CM | POA: Diagnosis not present

## 2018-11-15 DIAGNOSIS — N400 Enlarged prostate without lower urinary tract symptoms: Secondary | ICD-10-CM | POA: Diagnosis not present

## 2018-11-25 ENCOUNTER — Encounter: Payer: Self-pay | Admitting: Gastroenterology

## 2018-11-29 DIAGNOSIS — E7849 Other hyperlipidemia: Secondary | ICD-10-CM | POA: Diagnosis not present

## 2018-11-29 DIAGNOSIS — E1159 Type 2 diabetes mellitus with other circulatory complications: Secondary | ICD-10-CM | POA: Diagnosis not present

## 2018-11-29 DIAGNOSIS — I10 Essential (primary) hypertension: Secondary | ICD-10-CM | POA: Diagnosis not present

## 2018-11-29 DIAGNOSIS — I639 Cerebral infarction, unspecified: Secondary | ICD-10-CM | POA: Diagnosis not present

## 2019-02-21 DIAGNOSIS — D510 Vitamin B12 deficiency anemia due to intrinsic factor deficiency: Secondary | ICD-10-CM | POA: Diagnosis not present

## 2019-02-21 DIAGNOSIS — E7849 Other hyperlipidemia: Secondary | ICD-10-CM | POA: Diagnosis not present

## 2019-02-21 DIAGNOSIS — E1159 Type 2 diabetes mellitus with other circulatory complications: Secondary | ICD-10-CM | POA: Diagnosis not present

## 2019-02-21 DIAGNOSIS — N183 Chronic kidney disease, stage 3 (moderate): Secondary | ICD-10-CM | POA: Diagnosis not present

## 2019-02-21 DIAGNOSIS — I10 Essential (primary) hypertension: Secondary | ICD-10-CM | POA: Diagnosis not present

## 2019-02-24 DIAGNOSIS — I10 Essential (primary) hypertension: Secondary | ICD-10-CM | POA: Diagnosis not present

## 2019-02-24 DIAGNOSIS — R82998 Other abnormal findings in urine: Secondary | ICD-10-CM | POA: Diagnosis not present

## 2019-03-21 DIAGNOSIS — L570 Actinic keratosis: Secondary | ICD-10-CM | POA: Diagnosis not present

## 2019-03-21 DIAGNOSIS — L820 Inflamed seborrheic keratosis: Secondary | ICD-10-CM | POA: Diagnosis not present

## 2019-03-21 DIAGNOSIS — Z85820 Personal history of malignant melanoma of skin: Secondary | ICD-10-CM | POA: Diagnosis not present

## 2019-03-21 DIAGNOSIS — Z85828 Personal history of other malignant neoplasm of skin: Secondary | ICD-10-CM | POA: Diagnosis not present

## 2019-03-23 DIAGNOSIS — Z8669 Personal history of other diseases of the nervous system and sense organs: Secondary | ICD-10-CM | POA: Diagnosis not present

## 2019-03-23 DIAGNOSIS — E113412 Type 2 diabetes mellitus with severe nonproliferative diabetic retinopathy with macular edema, left eye: Secondary | ICD-10-CM | POA: Diagnosis not present

## 2019-03-23 DIAGNOSIS — E113411 Type 2 diabetes mellitus with severe nonproliferative diabetic retinopathy with macular edema, right eye: Secondary | ICD-10-CM | POA: Diagnosis not present

## 2019-03-23 DIAGNOSIS — H43812 Vitreous degeneration, left eye: Secondary | ICD-10-CM | POA: Diagnosis not present

## 2019-03-28 DIAGNOSIS — M79644 Pain in right finger(s): Secondary | ICD-10-CM | POA: Diagnosis not present

## 2019-03-28 DIAGNOSIS — S67192A Crushing injury of right middle finger, initial encounter: Secondary | ICD-10-CM | POA: Diagnosis not present

## 2019-03-28 DIAGNOSIS — S61212A Laceration without foreign body of right middle finger without damage to nail, initial encounter: Secondary | ICD-10-CM | POA: Diagnosis not present

## 2019-04-05 DIAGNOSIS — E113411 Type 2 diabetes mellitus with severe nonproliferative diabetic retinopathy with macular edema, right eye: Secondary | ICD-10-CM | POA: Diagnosis not present

## 2019-04-18 DIAGNOSIS — E113412 Type 2 diabetes mellitus with severe nonproliferative diabetic retinopathy with macular edema, left eye: Secondary | ICD-10-CM | POA: Diagnosis not present

## 2019-04-18 DIAGNOSIS — E113411 Type 2 diabetes mellitus with severe nonproliferative diabetic retinopathy with macular edema, right eye: Secondary | ICD-10-CM | POA: Diagnosis not present

## 2019-06-03 DIAGNOSIS — N183 Chronic kidney disease, stage 3 (moderate): Secondary | ICD-10-CM | POA: Diagnosis not present

## 2019-06-03 DIAGNOSIS — I639 Cerebral infarction, unspecified: Secondary | ICD-10-CM | POA: Diagnosis not present

## 2019-06-03 DIAGNOSIS — I129 Hypertensive chronic kidney disease with stage 1 through stage 4 chronic kidney disease, or unspecified chronic kidney disease: Secondary | ICD-10-CM | POA: Diagnosis not present

## 2019-06-03 DIAGNOSIS — E1159 Type 2 diabetes mellitus with other circulatory complications: Secondary | ICD-10-CM | POA: Diagnosis not present

## 2019-08-18 ENCOUNTER — Other Ambulatory Visit: Payer: Self-pay

## 2019-08-18 DIAGNOSIS — Z20822 Contact with and (suspected) exposure to covid-19: Secondary | ICD-10-CM

## 2019-08-20 LAB — NOVEL CORONAVIRUS, NAA: SARS-CoV-2, NAA: NOT DETECTED

## 2019-08-22 DIAGNOSIS — H43812 Vitreous degeneration, left eye: Secondary | ICD-10-CM | POA: Diagnosis not present

## 2019-08-22 DIAGNOSIS — H33321 Round hole, right eye: Secondary | ICD-10-CM | POA: Diagnosis not present

## 2019-08-22 DIAGNOSIS — E113411 Type 2 diabetes mellitus with severe nonproliferative diabetic retinopathy with macular edema, right eye: Secondary | ICD-10-CM | POA: Diagnosis not present

## 2019-08-22 DIAGNOSIS — E113412 Type 2 diabetes mellitus with severe nonproliferative diabetic retinopathy with macular edema, left eye: Secondary | ICD-10-CM | POA: Diagnosis not present

## 2019-08-25 DIAGNOSIS — H5213 Myopia, bilateral: Secondary | ICD-10-CM | POA: Diagnosis not present

## 2019-09-28 DIAGNOSIS — Z85828 Personal history of other malignant neoplasm of skin: Secondary | ICD-10-CM | POA: Diagnosis not present

## 2019-09-28 DIAGNOSIS — L570 Actinic keratosis: Secondary | ICD-10-CM | POA: Diagnosis not present

## 2019-09-28 DIAGNOSIS — L821 Other seborrheic keratosis: Secondary | ICD-10-CM | POA: Diagnosis not present

## 2019-09-28 DIAGNOSIS — Z85820 Personal history of malignant melanoma of skin: Secondary | ICD-10-CM | POA: Diagnosis not present

## 2019-10-03 DIAGNOSIS — E1159 Type 2 diabetes mellitus with other circulatory complications: Secondary | ICD-10-CM | POA: Diagnosis not present

## 2019-10-03 DIAGNOSIS — I129 Hypertensive chronic kidney disease with stage 1 through stage 4 chronic kidney disease, or unspecified chronic kidney disease: Secondary | ICD-10-CM | POA: Diagnosis not present

## 2019-10-03 DIAGNOSIS — H3500 Unspecified background retinopathy: Secondary | ICD-10-CM | POA: Diagnosis not present

## 2019-10-03 DIAGNOSIS — I639 Cerebral infarction, unspecified: Secondary | ICD-10-CM | POA: Diagnosis not present

## 2019-10-13 DIAGNOSIS — Z20822 Contact with and (suspected) exposure to covid-19: Secondary | ICD-10-CM | POA: Diagnosis not present

## 2019-10-19 DIAGNOSIS — N3000 Acute cystitis without hematuria: Secondary | ICD-10-CM | POA: Diagnosis not present

## 2019-11-23 DIAGNOSIS — Z85820 Personal history of malignant melanoma of skin: Secondary | ICD-10-CM | POA: Diagnosis not present

## 2019-11-23 DIAGNOSIS — D485 Neoplasm of uncertain behavior of skin: Secondary | ICD-10-CM | POA: Diagnosis not present

## 2019-11-23 DIAGNOSIS — L570 Actinic keratosis: Secondary | ICD-10-CM | POA: Diagnosis not present

## 2019-11-23 DIAGNOSIS — Z85828 Personal history of other malignant neoplasm of skin: Secondary | ICD-10-CM | POA: Diagnosis not present

## 2020-01-17 ENCOUNTER — Encounter (INDEPENDENT_AMBULATORY_CARE_PROVIDER_SITE_OTHER): Payer: Medicare Other | Admitting: Ophthalmology

## 2020-01-24 ENCOUNTER — Other Ambulatory Visit: Payer: Self-pay

## 2020-01-24 ENCOUNTER — Ambulatory Visit (INDEPENDENT_AMBULATORY_CARE_PROVIDER_SITE_OTHER): Payer: Medicare Other | Admitting: Ophthalmology

## 2020-01-24 ENCOUNTER — Encounter (INDEPENDENT_AMBULATORY_CARE_PROVIDER_SITE_OTHER): Payer: Self-pay | Admitting: Ophthalmology

## 2020-01-24 DIAGNOSIS — E113592 Type 2 diabetes mellitus with proliferative diabetic retinopathy without macular edema, left eye: Secondary | ICD-10-CM | POA: Insufficient documentation

## 2020-01-24 DIAGNOSIS — Z8669 Personal history of other diseases of the nervous system and sense organs: Secondary | ICD-10-CM | POA: Insufficient documentation

## 2020-01-24 DIAGNOSIS — E113411 Type 2 diabetes mellitus with severe nonproliferative diabetic retinopathy with macular edema, right eye: Secondary | ICD-10-CM | POA: Diagnosis not present

## 2020-01-24 DIAGNOSIS — E113513 Type 2 diabetes mellitus with proliferative diabetic retinopathy with macular edema, bilateral: Secondary | ICD-10-CM | POA: Diagnosis not present

## 2020-01-24 DIAGNOSIS — E113412 Type 2 diabetes mellitus with severe nonproliferative diabetic retinopathy with macular edema, left eye: Secondary | ICD-10-CM

## 2020-01-24 NOTE — Assessment & Plan Note (Signed)
OD, with noncentral involve CSME temporal to the fovea.  Further focal laser photocoagulation should clear this and stabilize it to protect the macula.

## 2020-01-24 NOTE — Assessment & Plan Note (Signed)
The nature of diabetic macular edema was discussed with the patient. Treatment options were outlined including medical therapy, laser & vitrectomy. The use of injectable medications reviewed, including Avastin, Lucentis, and Eylea. Periodic injections into the eye are likely to resolve diabetic macular edema (swelling in the center of vision). Initially, injections are delivered are delivered every 4-6 weeks, and the interval extended as the condition improves. On average, 8-9 injections the first year, and 5 in year 2. Improvement in the condition most often improves on medical therapy. Occasional use of focal laser is also recommended for residual macular edema (swelling). Excellent control of blood glucose and blood pressure are encouraged under the care of a primary physician or endocrinologist. Similarly, attempts to maintain serum cholesterol, low density lipoproteins, and high-density lipoproteins in a favorable range were recommended.   OS with center involved clinically significant macular edema.  I do recommend resumption of Eylea.  We will schedule the patient to access Eylea for you and/or good days and a return visit for injection OS.

## 2020-01-24 NOTE — Progress Notes (Signed)
01/24/2020     CHIEF COMPLAINT Patient presents for Retina Follow Up   HISTORY OF PRESENT ILLNESS: James Blevins is a 74 y.o. male who presents to the clinic today for:   HPI    Retina Follow Up    Patient presents with  Diabetic Retinopathy.  In both eyes.  This started 5 months ago.  Severity is moderate.  Duration of 5 months.  Since onset it is stable.          Comments    5 Month Diabetic F/U OU  Pt c/o continued blur OS. Pt sts [facility] OD is stable. No new symptoms reported OU. LBS: 116 this AM A1c: 8.2, 07/2019       Last edited by Rockie Neighbours, [place] on 01/24/2020  9:58 AM. (History)      Referring physician: [name], MD [place],  [place] [id number]  HISTORICAL INFORMATION:   Selected notes from the MEDICAL RECORD NUMBER    Lab Results  Component Value Date   HGBA1C 8.4 (H) 04/12/2016     CURRENT MEDICATIONS: No current outpatient medications on file. (Ophthalmic Drugs)   No current facility-administered medications for this visit. (Ophthalmic Drugs)   Current Outpatient Medications (Other)  Medication Sig  . aspirin 325 MG tablet Take 1 tablet (325 mg total) by mouth daily.  [name] atorvastatin (LIPITOR) 80 MG tablet Take 80 mg by mouth daily.  . clopidogrel (PLAVIX) 75 MG tablet Take 75 mg by mouth daily.  . cyanocobalamin (,VITAMIN B-12,) 1000 MCG/ML injection Please take once daily for next 7 days, then once weekly for 4 weeks, then once monthly.  [name] glipiZIDE (GLUCOTROL) 5 MG tablet Take 0.5 tablets (2.5 mg total) by mouth 2 (two) times daily before a meal. Please take for 2 days, then stop once or you resumed her Janumet back  . sitaGLIPtin-metformin (JANUMET) 50-1000 MG tablet Take 1 tablet by mouth 2 (two) times daily with a meal. Please hold for next 2 days as you have received IV contrast during hospital stay, resume on 04/17/2016   No current facility-administered medications for this visit. (Other)      REVIEW OF SYSTEMS:    ALLERGIES No Known Allergies  PAST MEDICAL HISTORY Past Medical History:  Diagnosis Date  . Cataracts, bilateral   . Diabetes ([place])   . Retinal detachment left eye, 08/2013  . Skin cancer   . Stroke ([place])   . Stroke due to embolism ([place]) 01/14/2016   Past Surgical History:  Procedure Laterality Date  . COLONOSCOPY  2002  . EYE SURGERY    . skin cancer removal    . TONSILLECTOMY  childhood    FAMILY HISTORY Family History  Problem Relation Age of Onset  . Clotting disorder Mother        heart attack  . Stroke Maternal Grandfather        stroke  . Clotting disorder Daughter        prothrombin gene mutation  . Colon cancer Neg Hx   . Esophageal cancer Neg Hx   . Rectal cancer Neg Hx   . Stomach cancer Neg Hx     SOCIAL HISTORY Social History   Tobacco Use  . Smoking status: Never Smoker  . Smokeless tobacco: Never Used  Substance Use Topics  . Alcohol use: Yes    Comment: rare  . Drug use: No         OPHTHALMIC EXAM:  Base Eye Exam  Visual Acuity (ETDRS)      Right Left   Dist [place] 20/20 -1 CF @ 5'   Dist ph [place]  NI       Tonometry (Tonopen, 10:02 AM)      Right Left   Pressure 10 09       Pupils      Pupils Dark Light Shape React APD   Right PERRL 4 3 Round Brisk None   Left PERRL 4 3 Round Brisk +1       Visual Fields (Counting fingers)      Left Right    Full Full       Extraocular Movement      Right Left    Full Full       Neuro/Psych    Oriented x3: Yes   Mood/Affect: Normal       Dilation    Both eyes: 1.0% Mydriacyl, 2.5% Phenylephrine @ 10:02 AM        Slit Lamp and Fundus Exam    External Exam      Right Left   External Normal Normal       Slit Lamp Exam      Right Left   Lids/Lashes Normal Normal   Conjunctiva/Sclera White and quiet White and quiet   Cornea Clear Clear   Anterior Chamber Deep and quiet Deep and quiet   Iris Round and reactive Round and reactive   Lens Posterior chamber intraocular lens  Posterior chamber intraocular lens   Anterior Vitreous Normal Normal       Fundus Exam      Right Left   Posterior Vitreous Normal Normal   Disc Normal Normal   C/D Ratio 0.6 0.6   Macula Macular thickening, Mild clinically significant macular edema, Microaneurysms Macular thickening, severe clinically significant macular edema, Microaneurysms   Vessels NPDR severe NPDR severe   Periphery Normal Normal          IMAGING AND PROCEDURES  Imaging and Procedures for 01/24/20  OCT, Retina - OU - Both Eyes       Right Eye Quality was good. Scan locations included subfoveal. Central Foveal Thickness: 311. Progression has worsened.   Left Eye Quality was good. Scan locations included subfoveal. Central Foveal Thickness: 685.   Notes OD, CSME still localized temporal to the foveal region.  Center is spared of central thickening.  May 1 day add grid laser to this residual thickening temporally OD.  OS with center involve CSME, worse since last visit December 2020.  I still recommend reconsideration of use of Eylea injection OS as his best chance to resolve the macular edema and potentially stabilize if not improved the acuity.                ASSESSMENT/PLAN:  Severe nonproliferative diabetic retinopathy of left eye, with macular edema, associated with type 2 diabetes mellitus ([place])  The nature of diabetic macular edema was discussed with the patient. Treatment options were outlined including medical therapy, laser & vitrectomy. The use of injectable medications reviewed, including Avastin, Lucentis, and Eylea. Periodic injections into the eye are likely to resolve diabetic macular edema (swelling in the center of vision). Initially, injections are delivered are delivered every 4-6 weeks, and the interval extended as the condition improves. On average, 8-9 injections the first year, and 5 in year 2. Improvement in the condition most often improves on medical therapy. Occasional use  of focal laser is also recommended for residual macular edema (swelling). Excellent  control of blood glucose and blood pressure are encouraged under the care of a primary physician or endocrinologist. Similarly, attempts to maintain serum cholesterol, low density lipoproteins, and high-density lipoproteins in a favorable range were recommended.   OS with center involved clinically significant macular edema.  I do recommend resumption of Eylea.  We will schedule the patient to access Eylea for you and/or good days and a return visit for injection OS.  Severe nonproliferative diabetic retinopathy of right eye, with macular edema, associated with type 2 diabetes mellitus (HCC) OD, with noncentral involve CSME temporal to the fovea.  Further focal laser photocoagulation should clear this and stabilize it to protect the macula.      ICD-10-CM   1. Severe nonproliferative diabetic retinopathy of right eye, with macular edema, associated with type 2 diabetes mellitus (HCC)  E11.3411 OCT, Retina - OU - Both Eyes  2. Severe nonproliferative diabetic retinopathy of left eye, with macular edema, associated with type 2 diabetes mellitus ([place])  [id number]   3. H/O retinal detachment  Z86.69   4. Type 2 diabetes mellitus with both eyes affected by proliferative retinopathy and macular edema, without long-term current use of insulin ([place])  [id number]     1.OD, CSME still localized temporal to the foveal region.  Center is spared of central thickening.  May 1 day add grid laser to this residual thickening temporally OD.  2.OS with center involve CSME, worse since last visit December 2020.  I still recommend reconsideration of use of Eylea injection OS as his best chance to resolve the macular edema and potentially stabilize if not improved the acuity.  3.  Ophthalmic Meds Ordered this visit:  No orders of the defined types were placed in this encounter.      Return in about 3 weeks (around 02/14/2020) for EYLEA  OCT, OS.  There are no Patient Instructions on file for this visit.   Explained the diagnoses, plan, and follow up with the patient and they expressed understanding.  Patient expressed understanding of the importance of proper follow up care.   [name] Rankin M.D. Diseases & Surgery of the Retina and Vitreous Retina & Diabetic [place] 01/24/20     Abbreviations: M myopia (nearsighted); A astigmatism; H hyperopia (farsighted); P presbyopia; Mrx spectacle prescription;  CTL contact lenses; OD right eye; OS left eye; OU both eyes  XT exotropia; ET esotropia; PEK punctate epithelial keratitis; PEE punctate epithelial erosions; DES dry eye syndrome; MGD meibomian gland dysfunction; ATs artificial tears; PFAT's preservative free artificial tears; [place] nuclear sclerotic cataract; PSC posterior subcapsular cataract; ERM epi-retinal membrane; PVD posterior vitreous detachment; RD retinal detachment; DM diabetes mellitus; DR diabetic retinopathy; NPDR non-proliferative diabetic retinopathy; PDR proliferative diabetic retinopathy; CSME clinically significant macular edema; DME diabetic macular edema; dbh dot blot hemorrhages; CWS cotton wool spot; POAG primary open angle glaucoma; C/D cup-to-disc ratio; HVF humphrey visual field; GVF goldmann visual field; OCT optical coherence tomography; IOP intraocular pressure; BRVO Branch retinal vein occlusion; CRVO central retinal vein occlusion; CRAO central retinal artery occlusion; BRAO branch retinal artery occlusion; RT retinal tear; SB scleral buckle; PPV pars plana vitrectomy; VH Vitreous hemorrhage; PRP panretinal laser photocoagulation; IVK intravitreal kenalog; VMT vitreomacular traction; MH Macular hole;  NVD neovascularization of the disc; NVE neovascularization elsewhere; AREDS age related eye disease study; ARMD age related macular degeneration; POAG primary open angle glaucoma; EBMD epithelial/anterior basement membrane dystrophy; ACIOL anterior chamber  intraocular lens; IOL intraocular lens; PCIOL posterior chamber intraocular lens; Phaco/IOL phacoemulsification with  intraocular lens placement; [place] photorefractive keratectomy; LASIK laser assisted in situ keratomileusis; HTN hypertension; DM diabetes mellitus; COPD chronic obstructive pulmonary disease

## 2020-02-15 ENCOUNTER — Ambulatory Visit (INDEPENDENT_AMBULATORY_CARE_PROVIDER_SITE_OTHER): Payer: Medicare Other | Admitting: Ophthalmology

## 2020-02-15 ENCOUNTER — Encounter (INDEPENDENT_AMBULATORY_CARE_PROVIDER_SITE_OTHER): Payer: Self-pay | Admitting: Ophthalmology

## 2020-02-15 ENCOUNTER — Other Ambulatory Visit: Payer: Self-pay

## 2020-02-15 DIAGNOSIS — E113412 Type 2 diabetes mellitus with severe nonproliferative diabetic retinopathy with macular edema, left eye: Secondary | ICD-10-CM | POA: Diagnosis not present

## 2020-02-15 MED ORDER — AFLIBERCEPT 2MG/0.05ML IZ SOLN FOR KALEIDOSCOPE
2.0000 mg | INTRAVITREAL | Status: AC | PRN
  Administered 2020-02-15: 2 mg via INTRAVITREAL

## 2020-02-15 NOTE — Progress Notes (Signed)
02/15/2020     CHIEF COMPLAINT Patient presents for Retina Follow Up   HISTORY OF PRESENT ILLNESS: James Blevins is a 74 y.o. male who presents to the clinic today for:   HPI    Retina Follow Up    Patient presents with  Diabetic Retinopathy.  In left eye.  This started 3 weeks ago.  Severity is moderate.  Duration of 3 weeks.  Since onset it is stable.          Comments    3 Week Diabetic F/U OS, poss Eylea OS  Pt denies noticeable changes to [place] OU since last visit. Pt denies ocular pain, flashes of light, or floaters OU.  LBS: 130 this AM        Last edited by Rockie Neighbours, [place] on 02/15/2020  9:04 AM. (History)      Referring physician: [name], MD [place],  [place] [address]  HISTORICAL INFORMATION:   Selected notes from the MEDICAL RECORD NUMBER    Lab Results  Component Value Date   HGBA1C 8.4 (H) 04/12/2016     CURRENT MEDICATIONS: No current outpatient medications on file. (Ophthalmic Drugs)   No current facility-administered medications for this visit. (Ophthalmic Drugs)   Current Outpatient Medications (Other)  Medication Sig  . aspirin 325 MG tablet Take 1 tablet (325 mg total) by mouth daily.  [name] atorvastatin (LIPITOR) 80 MG tablet Take 80 mg by mouth daily.  . clopidogrel (PLAVIX) 75 MG tablet Take 75 mg by mouth daily.  . cyanocobalamin (,VITAMIN B-12,) 1000 MCG/ML injection Please take once daily for next 7 days, then once weekly for 4 weeks, then once monthly.  [name] glipiZIDE (GLUCOTROL) 5 MG tablet Take 0.5 tablets (2.5 mg total) by mouth 2 (two) times daily before a meal. Please take for 2 days, then stop once or you resumed her Janumet back  . sitaGLIPtin-metformin (JANUMET) 50-1000 MG tablet Take 1 tablet by mouth 2 (two) times daily with a meal. Please hold for next 2 days as you have received IV contrast during hospital stay, resume on 04/17/2016   No current facility-administered medications for this visit. (Other)       REVIEW OF SYSTEMS:    ALLERGIES No Known Allergies  PAST MEDICAL HISTORY Past Medical History:  Diagnosis Date  . Cataracts, bilateral   . Diabetes ([place])   . Retinal detachment left eye, 08/2013  . Skin cancer   . Stroke ([place])   . Stroke due to embolism ([place]) 01/14/2016   Past Surgical History:  Procedure Laterality Date  . COLONOSCOPY  2002  . EYE SURGERY    . skin cancer removal    . TONSILLECTOMY  childhood    FAMILY HISTORY Family History  Problem Relation Age of Onset  . Clotting disorder Mother        heart attack  . Stroke Maternal Grandfather        stroke  . Clotting disorder Daughter        prothrombin gene mutation  . Colon cancer Neg Hx   . Esophageal cancer Neg Hx   . Rectal cancer Neg Hx   . Stomach cancer Neg Hx     SOCIAL HISTORY Social History   Tobacco Use  . Smoking status: Never Smoker  . Smokeless tobacco: Never Used  Substance Use Topics  . Alcohol use: Yes    Comment: rare  . Drug use: No         OPHTHALMIC EXAM:  Base Eye Exam    Visual Acuity (ETDRS)      Right Left   Dist [place] 20/20 -2 CF @ 5'   Dist ph [place]  NI       Tonometry (Tonopen, 9:08 AM)      Right Left   Pressure 13 13       Pupils      Pupils Dark Light Shape React APD   Right PERRL 4 3 Round Brisk None   Left PERRL 4 3 Round Brisk +1       Visual Fields (Counting fingers)      Left Right    Full Full       Extraocular Movement      Right Left    Full Full       Neuro/Psych    Oriented x3: Yes   Mood/Affect: Normal       Dilation    Left eye: 1.0% Mydriacyl, 2.5% Phenylephrine @ 9:08 AM        Slit Lamp and Fundus Exam    External Exam      Right Left   External Normal Normal       Slit Lamp Exam      Right Left   Lids/Lashes Normal Normal   Conjunctiva/Sclera White and quiet White and quiet   Cornea Clear Clear   Anterior Chamber Deep and quiet Deep and quiet   Iris Round and reactive Round and reactive   Lens  Posterior chamber intraocular lens Posterior chamber intraocular lens   Anterior Vitreous Normal Normal       Fundus Exam      Right Left   Posterior Vitreous  Normal   Disc  Normal   C/D Ratio  0.6   Macula  Macular thickening, severe clinically significant macular edema, Microaneurysms   Vessels  NPDR severe   Periphery  Normal          IMAGING AND PROCEDURES  Imaging and Procedures for 02/15/20  OCT, Retina - OU - Both Eyes       Right Eye Quality was good. Scan locations included subfoveal. Central Foveal Thickness: 309. Progression has been stable. Findings include normal observations.   Left Eye Quality was good. Scan locations included subfoveal. Central Foveal Thickness: 638. Progression has been stable. Findings include cystoid macular edema.   Notes OS CSME massive, will deliver intravitreal Eylea OS today       Intravitreal Injection, Pharmacologic Agent - OS - Left Eye       Time Out 02/15/2020. 10:11 AM. Confirmed correct patient, procedure, site, and patient consented.   Anesthesia Topical anesthesia was used. Anesthetic medications included [place] 3.5%.   Procedure Injection:  2 mg aflibercept Alfonse Flavors) SOLN   NDC: [id number], Lot: [id number]   Route: Intravitreal, Site: Left Eye, Waste: 0 mg  Post-op Post injection exam found visual acuity of at least counting fingers. The patient tolerated the procedure well. There were no complications. The patient received written and verbal post procedure care education. Post injection medications were not given.                 ASSESSMENT/PLAN:  Severe nonproliferative diabetic retinopathy of left eye, with macular edema, associated with type 2 diabetes mellitus ([place]) Eylea OS today to control massive clinically significant macular edema.      ICD-10-CM   1. Severe nonproliferative diabetic retinopathy of left eye, with macular edema, associated with type 2 diabetes mellitus ([place])  Q46.9629 OCT,  Retina -  OU - Both Eyes    Intravitreal Injection, Pharmacologic Agent - OS - Left Eye    aflibercept (EYLEA) SOLN 2 mg    1.Eylea OS today to control massive clinically significant macular edema.  2.  3.  Ophthalmic Meds Ordered this visit:  Meds ordered this encounter  Medications  . aflibercept (EYLEA) SOLN 2 mg       Return in about 5 weeks (around 03/21/2020) for dilate, OS, EYLEA OCT.  There are no Patient Instructions on file for this visit.   Explained the diagnoses, plan, and follow up with the patient and they expressed understanding.  Patient expressed understanding of the importance of proper follow up care.   [name] Khori Rosevear M.D. Diseases & Surgery of the Retina and Vitreous Retina & Diabetic [place] 02/15/20     Abbreviations: M myopia (nearsighted); A astigmatism; H hyperopia (farsighted); P presbyopia; Mrx spectacle prescription;  CTL contact lenses; OD right eye; OS left eye; OU both eyes  XT exotropia; ET esotropia; PEK punctate epithelial keratitis; PEE punctate epithelial erosions; DES dry eye syndrome; MGD meibomian gland dysfunction; ATs artificial tears; PFAT's preservative free artificial tears; [place] nuclear sclerotic cataract; PSC posterior subcapsular cataract; ERM epi-retinal membrane; PVD posterior vitreous detachment; RD retinal detachment; DM diabetes mellitus; DR diabetic retinopathy; NPDR non-proliferative diabetic retinopathy; PDR proliferative diabetic retinopathy; CSME clinically significant macular edema; DME diabetic macular edema; dbh dot blot hemorrhages; CWS cotton wool spot; POAG primary open angle glaucoma; C/D cup-to-disc ratio; HVF humphrey visual field; GVF goldmann visual field; OCT optical coherence tomography; IOP intraocular pressure; BRVO Branch retinal vein occlusion; CRVO central retinal vein occlusion; CRAO central retinal artery occlusion; BRAO branch retinal artery occlusion; RT retinal tear; SB scleral buckle; PPV pars plana  vitrectomy; VH Vitreous hemorrhage; PRP panretinal laser photocoagulation; IVK intravitreal kenalog; VMT vitreomacular traction; MH Macular hole;  NVD neovascularization of the disc; NVE neovascularization elsewhere; AREDS age related eye disease study; ARMD age related macular degeneration; POAG primary open angle glaucoma; EBMD epithelial/anterior basement membrane dystrophy; ACIOL anterior chamber intraocular lens; IOL intraocular lens; PCIOL posterior chamber intraocular lens; Phaco/IOL phacoemulsification with intraocular lens placement; [place] photorefractive keratectomy; LASIK laser assisted in situ keratomileusis; HTN hypertension; DM diabetes mellitus; COPD chronic obstructive pulmonary disease

## 2020-02-15 NOTE — Assessment & Plan Note (Signed)
Eylea OS today to control massive clinically significant macular edema.

## 2020-02-18 ENCOUNTER — Ambulatory Visit (HOSPITAL_COMMUNITY): Payer: Self-pay | Admitting: Ophthalmology

## 2020-02-18 ENCOUNTER — Ambulatory Visit (INDEPENDENT_AMBULATORY_CARE_PROVIDER_SITE_OTHER): Payer: Medicare Other | Admitting: Ophthalmology

## 2020-02-18 ENCOUNTER — Emergency Department (HOSPITAL_COMMUNITY): Payer: Medicare Other | Admitting: Anesthesiology

## 2020-02-18 ENCOUNTER — Other Ambulatory Visit (INDEPENDENT_AMBULATORY_CARE_PROVIDER_SITE_OTHER): Payer: Self-pay

## 2020-02-18 ENCOUNTER — Ambulatory Visit (INDEPENDENT_AMBULATORY_CARE_PROVIDER_SITE_OTHER): Payer: Self-pay | Admitting: Ophthalmology

## 2020-02-18 ENCOUNTER — Encounter (HOSPITAL_COMMUNITY): Payer: Self-pay | Admitting: Anesthesiology

## 2020-02-18 ENCOUNTER — Encounter (HOSPITAL_COMMUNITY): Admission: EM | Disposition: A | Discharge status: Home / Self Care | Payer: Self-pay | Source: Home / Self Care

## 2020-02-18 ENCOUNTER — Other Ambulatory Visit: Payer: Self-pay

## 2020-02-18 ENCOUNTER — Ambulatory Visit (HOSPITAL_COMMUNITY)
Admission: EM | Admit: 2020-02-18 | Discharge: 2020-02-18 | Disposition: A | Discharge status: Home / Self Care | Payer: Medicare Other | Attending: Emergency Medicine | Admitting: Emergency Medicine

## 2020-02-18 ENCOUNTER — Encounter (INDEPENDENT_AMBULATORY_CARE_PROVIDER_SITE_OTHER): Payer: Medicare Other | Admitting: Ophthalmology

## 2020-02-18 ENCOUNTER — Inpatient Hospital Stay: Admit: 2020-02-18 | Payer: Medicare Other | Admitting: Ophthalmology

## 2020-02-18 DIAGNOSIS — Z20822 Contact with and (suspected) exposure to covid-19: Secondary | ICD-10-CM | POA: Diagnosis not present

## 2020-02-18 DIAGNOSIS — Z7982 Long term (current) use of aspirin: Secondary | ICD-10-CM | POA: Insufficient documentation

## 2020-02-18 DIAGNOSIS — H44002 Unspecified purulent endophthalmitis, left eye: Secondary | ICD-10-CM

## 2020-02-18 DIAGNOSIS — Z79899 Other long term (current) drug therapy: Secondary | ICD-10-CM | POA: Insufficient documentation

## 2020-02-18 DIAGNOSIS — H21542 Posterior synechiae (iris), left eye: Secondary | ICD-10-CM | POA: Diagnosis not present

## 2020-02-18 DIAGNOSIS — Z7902 Long term (current) use of antithrombotics/antiplatelets: Secondary | ICD-10-CM | POA: Insufficient documentation

## 2020-02-18 DIAGNOSIS — E11319 Type 2 diabetes mellitus with unspecified diabetic retinopathy without macular edema: Secondary | ICD-10-CM | POA: Diagnosis not present

## 2020-02-18 DIAGNOSIS — H20052 Hypopyon, left eye: Secondary | ICD-10-CM

## 2020-02-18 DIAGNOSIS — E113411 Type 2 diabetes mellitus with severe nonproliferative diabetic retinopathy with macular edema, right eye: Secondary | ICD-10-CM

## 2020-02-18 DIAGNOSIS — Z09 Encounter for follow-up examination after completed treatment for conditions other than malignant neoplasm: Secondary | ICD-10-CM

## 2020-02-18 DIAGNOSIS — Z7984 Long term (current) use of oral hypoglycemic drugs: Secondary | ICD-10-CM | POA: Diagnosis not present

## 2020-02-18 DIAGNOSIS — E119 Type 2 diabetes mellitus without complications: Secondary | ICD-10-CM | POA: Diagnosis not present

## 2020-02-18 DIAGNOSIS — E113412 Type 2 diabetes mellitus with severe nonproliferative diabetic retinopathy with macular edema, left eye: Secondary | ICD-10-CM

## 2020-02-18 HISTORY — DX: Unspecified purulent endophthalmitis, left eye: H44.002

## 2020-02-18 HISTORY — DX: Hypopyon, left eye: H20.052

## 2020-02-18 HISTORY — PX: PARS PLANA VITRECTOMY: SHX2166

## 2020-02-18 LAB — GRAM STAIN

## 2020-02-18 LAB — GLUCOSE, CAPILLARY: Glucose-Capillary: 89 mg/dL (ref 70–99)

## 2020-02-18 LAB — SARS CORONAVIRUS 2 BY RT PCR (HOSPITAL ORDER, PERFORMED IN ~~LOC~~ HOSPITAL LAB): SARS Coronavirus 2: NEGATIVE

## 2020-02-18 SURGERY — PARS PLANA VITRECTOMY 25 GAUGE FOR ENDOPHTHALMITIS
Anesthesia: Monitor Anesthesia Care | Site: Eye | Laterality: Left

## 2020-02-18 SURGERY — PARS PLANA VITRECTOMY 25 GAUGE FOR ENDOPHTHALMITIS
Anesthesia: Monitor Anesthesia Care | Laterality: Left

## 2020-02-18 MED ORDER — EPINEPHRINE PF 1 MG/ML IJ SOLN
INTRAMUSCULAR | Status: DC | PRN
  Administered 2020-02-18: 1 mg

## 2020-02-18 MED ORDER — BSS PLUS IO SOLN
INTRAOCULAR | Status: AC
  Filled 2020-02-18: qty 500

## 2020-02-18 MED ORDER — EPINEPHRINE PF 1 MG/ML IJ SOLN
INTRAMUSCULAR | Status: AC
  Filled 2020-02-18: qty 1

## 2020-02-18 MED ORDER — SODIUM CHLORIDE (PF) 0.9 % IJ SOLN
INTRAMUSCULAR | Status: AC
  Filled 2020-02-18: qty 10

## 2020-02-18 MED ORDER — GATIFLOXACIN 0.5 % OP SOLN: 1.0000 [drp] | OPHTHALMIC | Status: DC | PRN

## 2020-02-18 MED ORDER — LIDOCAINE HCL 2 % IJ SOLN
INTRAMUSCULAR | Status: DC | PRN
  Administered 2020-02-18: 9 mL

## 2020-02-18 MED ORDER — VANCOMYCIN INTRAVITREAL INJECTION 1 MG/0.1 ML
INTRAOCULAR | Status: DC | PRN
  Administered 2020-02-18: 1 mg via INTRAVITREAL

## 2020-02-18 MED ORDER — VANCOMYCIN HCL IN DEXTROSE 1-5 GM/200ML-% IV SOLN
1000.0000 mg | Freq: Once | INTRAVENOUS | Status: DC
  Filled 2020-02-18 (×2): qty 200

## 2020-02-18 MED ORDER — MOXIFLOXACIN HCL 400 MG PO TABS: 400.0000 mg | ORAL_TABLET | Freq: Every day | ORAL | 0 refills | Status: AC

## 2020-02-18 MED ORDER — CEFTAZIDIME INTRAVITREAL INJECTION 2.25 MG/0.1 ML
INTRAVITREAL | Status: DC | PRN
Start: 2020-02-18 — End: 2020-02-18
  Administered 2020-02-18: 2.25 mg via INTRAVITREAL

## 2020-02-18 MED ORDER — CEFTAZIDIME INTRAVITREAL INJECTION 2.25 MG/0.1 ML
2.2500 mg | INTRAVITREAL | Status: DC
  Filled 2020-02-18: qty 0.1

## 2020-02-18 MED ORDER — HYPROMELLOSE (GONIOSCOPIC) 2.5 % OP SOLN
OPHTHALMIC | Status: AC
  Filled 2020-02-18: qty 15

## 2020-02-18 MED ORDER — DEXAMETHASONE SODIUM PHOSPHATE 10 MG/ML IJ SOLN
INTRAMUSCULAR | Status: DC | PRN
  Administered 2020-02-18: 10 mg via INTRAVENOUS

## 2020-02-18 MED ORDER — PROPOFOL 10 MG/ML IV BOLUS
INTRAVENOUS | Status: AC
  Filled 2020-02-18: qty 20

## 2020-02-18 MED ORDER — PHENYLEPHRINE HCL 2.5 % OP SOLN: 1.0000 [drp] | OPHTHALMIC | Status: DC | PRN

## 2020-02-18 MED ORDER — CYCLOPENTOLATE HCL 0.5 % OP SOLN: 1.0000 [drp] | Freq: Three times a day (TID) | OPHTHALMIC | 0 refills | Status: DC

## 2020-02-18 MED ORDER — CYCLOPENTOLATE HCL 1 % OP SOLN: 1.0000 [drp] | OPHTHALMIC | Status: DC | PRN

## 2020-02-18 MED ORDER — ONDANSETRON HCL 4 MG/2ML IJ SOLN
INTRAMUSCULAR | Status: DC | PRN
  Administered 2020-02-18: 4 mg via INTRAVENOUS

## 2020-02-18 MED ORDER — DEXAMETHASONE SODIUM PHOSPHATE 10 MG/ML IJ SOLN
INTRAMUSCULAR | Status: AC
  Filled 2020-02-18: qty 1

## 2020-02-18 MED ORDER — SODIUM CHLORIDE 0.9 % IV SOLN
1.0000 g | Freq: Once | INTRAVENOUS | Status: AC
  Administered 2020-02-18: 1 g via INTRAVENOUS
  Filled 2020-02-18 (×2): qty 1

## 2020-02-18 MED ORDER — GENTAMICIN SULFATE 40 MG/ML IJ SOLN
INTRAMUSCULAR | Status: AC
  Filled 2020-02-18: qty 2

## 2020-02-18 MED ORDER — MOXIFLOXACIN HCL 0.5 % OP SOLN
1.0000 [drp] | Freq: Three times a day (TID) | OPHTHALMIC | 0 refills | Status: AC
Start: 2020-02-18 — End: 2020-02-25

## 2020-02-18 MED ORDER — SODIUM HYALURONATE 10 MG/ML IO SOLN
INTRAOCULAR | Status: AC
  Filled 2020-02-18: qty 0.85

## 2020-02-18 MED ORDER — MOXIFLOXACIN HCL 0.5 % OP SOLN: 1.0000 [drp] | Freq: Three times a day (TID) | OPHTHALMIC | 1 refills | Status: DC

## 2020-02-18 MED ORDER — LIDOCAINE HCL 2 % IJ SOLN
INTRAMUSCULAR | Status: AC
  Filled 2020-02-18: qty 20

## 2020-02-18 MED ORDER — NA CHONDROIT SULF-NA HYALURON 40-30 MG/ML IO SOLN
INTRAOCULAR | Status: AC
  Filled 2020-02-18: qty 0.5

## 2020-02-18 MED ORDER — POLYMYXIN B SULFATE 500000 UNITS IJ SOLR
INTRAMUSCULAR | Status: AC
  Filled 2020-02-18: qty 500000

## 2020-02-18 MED ORDER — BSS PLUS IO SOLN
INTRAOCULAR | Status: DC | PRN
  Administered 2020-02-18: 1 via INTRAOCULAR

## 2020-02-18 MED ORDER — MOXIFLOXACIN HCL 400 MG PO TABS: 400.0000 mg | ORAL_TABLET | Freq: Every day | ORAL | 0 refills | Status: DC

## 2020-02-18 MED ORDER — TETRACAINE HCL 0.5 % OP SOLN
OPHTHALMIC | Status: AC
  Filled 2020-02-18: qty 4

## 2020-02-18 MED ORDER — PROPOFOL 10 MG/ML IV BOLUS
INTRAVENOUS | Status: DC | PRN
  Administered 2020-02-18: 20 mg via INTRAVENOUS
  Administered 2020-02-18: 10 mg via INTRAVENOUS

## 2020-02-18 MED ORDER — PREDNISOLONE ACETATE 1 % OP SUSP: 1.0000 [drp] | Freq: Four times a day (QID) | OPHTHALMIC | 0 refills | Status: AC

## 2020-02-18 MED ORDER — VANCOMYCIN INTRAVITREAL INJECTION 1 MG/0.1 ML
1.0000 mg | INTRAOCULAR | Status: DC
  Filled 2020-02-18: qty 0.1

## 2020-02-18 MED ORDER — LACTATED RINGERS IV SOLN: INTRAVENOUS | Status: DC | PRN

## 2020-02-18 MED ORDER — CYCLOPENTOLATE HCL 1 % OP SOLN: 1.0000 [drp] | Freq: Two times a day (BID) | OPHTHALMIC | Status: DC

## 2020-02-18 SURGICAL SUPPLY — 29 items
APPLICATOR DR MATTHEWS STRL (MISCELLANEOUS) ×2 IMPLANT
BNDG EYE OVAL (GAUZE/BANDAGES/DRESSINGS) ×2 IMPLANT
CANNULA TROCAR 25 GA VLV (OPHTHALMIC) ×2 IMPLANT
CANNULA VLV SOFT TIP 25GA (OPHTHALMIC) ×2 IMPLANT
COVER MAYO STAND STRL (DRAPES) ×2 IMPLANT
DRAPE OPHTHALMIC 77X100 STRL (CUSTOM PROCEDURE TRAY) ×2 IMPLANT
FORCEPS ECKARDT ILM 25G SERR (OPHTHALMIC RELATED) ×2 IMPLANT
GLOVE SS BIOGEL STRL SZ 8.5 (GLOVE) ×1 IMPLANT
GLOVE SUPERSENSE BIOGEL SZ 8.5 (GLOVE) ×1
GOWN STRL REUS W/ TWL LRG LVL3 (GOWN DISPOSABLE) ×2 IMPLANT
GOWN STRL REUS W/TWL LRG LVL3 (GOWN DISPOSABLE) ×4
KIT BASIN OR (CUSTOM PROCEDURE TRAY) ×2 IMPLANT
KIT TURNOVER KIT B (KITS) ×2 IMPLANT
LENS BIOM SUPER VIEW SET DISP (MISCELLANEOUS) ×2 IMPLANT
NEEDLE 18GX1X1/2 (RX/OR ONLY) (NEEDLE) ×4 IMPLANT
NEEDLE 25GX 5/8IN NON SAFETY (NEEDLE) ×2 IMPLANT
NEEDLE FILTER BLUNT 18X 1/2SAF (NEEDLE) ×1
NEEDLE FILTER BLUNT 18X1 1/2 (NEEDLE) ×1 IMPLANT
NEEDLE HYPO 30X.5 LL (NEEDLE) ×4 IMPLANT
PACK VITRECTOMY CUSTOM (CUSTOM PROCEDURE TRAY) ×2 IMPLANT
PAD ARMBOARD 7.5X6 YLW CONV (MISCELLANEOUS) ×4 IMPLANT
PAK PIK VITRECTOMY CVS 25GA (OPHTHALMIC) ×2 IMPLANT
PENCIL BIPOLAR 25GA STR DISP (OPHTHALMIC RELATED) ×2 IMPLANT
SHIELD EYE LENSE ONLY DISP (GAUZE/BANDAGES/DRESSINGS) ×2 IMPLANT
STOCKINETTE IMPERVIOUS 9X36 MD (GAUZE/BANDAGES/DRESSINGS) ×4 IMPLANT
SYR TB 1ML LUER SLIP (SYRINGE) ×4 IMPLANT
TAPE SURG TRANSPORE 1 IN (GAUZE/BANDAGES/DRESSINGS) ×1 IMPLANT
TAPE SURGICAL TRANSPORE 1 IN (GAUZE/BANDAGES/DRESSINGS) ×2
TOWEL GREEN STERILE FF (TOWEL DISPOSABLE) ×4 IMPLANT

## 2020-02-18 NOTE — Transfer of Care (Signed)
Immediate Anesthesia Transfer of Care Note  Patient: James Blevins  Procedure(s) Performed: PARS PLANA VITRECTOMY 25 GAUGE FOR ENDOPHTHALMITIS, ENDOPTHALMITIS PROTOCOL WITH ANTIBIOTIC INJECTION (Left )  Patient Location: PACU  Anesthesia Type:MAC  Level of Consciousness: awake, alert  and oriented  Airway & Oxygen Therapy: Patient Spontanous Breathing  Post-op Assessment: Report given to RN and Post -op Vital signs reviewed and stable  Post vital signs: Reviewed and stable  Last Vitals:  Vitals Value Taken Time  BP 166/78 02/18/20 2051  Temp 36.9 C 02/18/20 2036  Pulse 87 02/18/20 2105  Resp 19 02/18/20 2105  SpO2 99 % 02/18/20 2105  Vitals shown include unvalidated device data.  Last Pain:  Vitals:   02/18/20 2036  PainSc: 0-No pain         Complications: No complications documented.

## 2020-02-18 NOTE — Anesthesia Procedure Notes (Signed)
Procedure Name: MAC Date/Time: 02/18/2020 7:50 PM Performed by: Suzy Bouchard, CRNA Pre-anesthesia Checklist: Patient identified, Emergency Drugs available, Suction available, Patient being monitored and Timeout performed Patient Re-evaluated:Patient Re-evaluated prior to induction Oxygen Delivery Method: Nasal cannula Placement Confirmation: positive ETCO2

## 2020-02-18 NOTE — Assessment & Plan Note (Signed)
Patient will be made n.p.o. immediately.  Patient has a history of having a last meal or consumption at 1400 hrs. Today.  I explained to patient and family that is critical that we and we suspect endophthalmitis that we proceed immediately to the surgical operating room theater whereby we will take cultures from the vitreous cavity we will do a vitreous washout and vitrectomy to debulk the infection and/or inflammation but more importantly deliver therapeutic intravitreal antibiotics today in an attempt to prevent progression of vision loss.  Patient be n.p.o. now.  We will schedule this promptly at [facility] local MAC anesthesia left eye

## 2020-02-18 NOTE — Op Note (Signed)
Preoperative diagnosis  1.  Acute endophthalmitis left eye  2.  Hypopyon left eye 3.  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  Diagnostic vitreous tap   4.  injection of intravitreal antibiotics left eye,  Fortaz 2.25 mg / 0.1 cc, volume 0.1 cc  Vancomycin 1.0 mg / 0.1 cc, volume 0.1 cc  Surgeon Deloria Lair, MD  Anesthesia is local retrobulbar with monitored anesthesia control  No complications  No blood loss  Indication for procedure   patient has developed 24 hours of increasing achy pain and profound vision loss in the left eye over the last 24 hours.  This occurred some 4 days after recent injection of intravitreal antivegF medication for macular edema.   office visit today at 5 PM  disclosed evidence of hypopyon and suggestion of acute endophthalmitis.  Patient was promptly informed and asked to proceed immediately to [facility] emergency room for work-up and for planned operating room surgical intervention   After appropriate signed consent was obtained patient taken to the operating room.  In the operating room appropriate monitoring was followed by mild station.  Surgical timeout was carried out with staff and surgeon followed thereafter by delivery of 5 cc Xylocaine 2% no epi,  retrobulbar left eye with additional 5 cc in the fashion of a modified Kirk Ruths with a Milarose Savich modification.  The left periocular region was then sterilely prepped and draped in usual ophthalmic fashion.  Lid speculum applied.  25-gauge trocar was initially placed in the anterior chamber.  Infusion was then turned on under low infusion pressures of 20-25 this mobilized the anterior chamber inflammation and fibrin posterior synechia.  A second 25 entry port placed in the anterior chamber using the vitrectomized rotation this material was mobilized  and the hypopyon removed.  Another trocar was placed in the vitreous cavity 3-1/2 mm posterior to the limbus in the inferotemporal quadrant.  Placement verified visually with a light pipe prior to commencing the infusion.  Superior trochars were then applied posteriorly as well in the anterior chamber trochars were allowed to remain in place and to maintain the depth of the anterior chamber and we cleaned it should opacity recur.  A core vitrectomy was begun and and the vitrector was held in the anterior to mid vitreous throughout the case.  Significant clearing occurred the vitreous cavity of the white fluffy material.  The optic nerve is visualized as was the macular region although still hazy details.  At this time instruments removed from the eye.  Trochars removed in the anterior chamber and the anterior chamber maintained depth.  The wounds were secure.  Superior trochars were removed and the intraocular pressure was maintained at 25 mm prior to remove the last infusion trocar.  At this time 0.1 cc of vancomycin followed by 0.1 cc of Fortaz were delivered into the vitreous cavity.  Remnants of the Tressie Ellis were placed subconjunctival.  Sterile patch ofloxacin applied to the eye.  Patient taken the PACU in good stabilization.  There were no complications.

## 2020-02-18 NOTE — Anesthesia Postprocedure Evaluation (Signed)
Anesthesia Post Note  Patient: James Blevins  Procedure(s) Performed: PARS PLANA VITRECTOMY 25 GAUGE FOR ENDOPHTHALMITIS, ENDOPTHALMITIS PROTOCOL WITH ANTIBIOTIC INJECTION (Left )     Anesthesia Post Evaluation No complications documented.  Last Vitals:  Vitals:   02/18/20 2036 02/18/20 2051  BP: (!) 162/73 (!) 166/78  Pulse: 72 73  Resp: 12 15  Temp: 36.9 C   SpO2: 96% 98%    Last Pain:  Vitals:   02/18/20 2036  PainSc: 0-No pain                 Maycel Riffe

## 2020-02-18 NOTE — Anesthesia Preprocedure Evaluation (Signed)
Anesthesia Evaluation  Patient identified by MRN, date of birth, ID band Patient awake    Reviewed: Allergy & Precautions, NPO status , Patient's Chart, lab work & pertinent test results  Airway Mallampati: II  TM Distance: >3 FB Neck ROM: Full    Dental  (+) Edentulous Upper, Edentulous Lower   Pulmonary    breath sounds clear to auscultation       Cardiovascular  Rhythm:Regular Rate:Normal     Neuro/Psych    GI/Hepatic   Endo/Other  diabetes  Renal/GU      Musculoskeletal   Abdominal   Peds  Hematology   Anesthesia Other Findings   Reproductive/Obstetrics                             Anesthesia Physical Anesthesia Plan  ASA: III and emergent  Anesthesia Plan: MAC   Post-op Pain Management:    Induction: Intravenous  PONV Risk Score and Plan: Ondansetron  Airway Management Planned: Natural Airway and Nasal Cannula  Additional Equipment:   Intra-op Plan:   Post-operative Plan:   Informed Consent: I have reviewed the patients History and Physical, chart, labs and discussed the procedure including the risks, benefits and alternatives for the proposed anesthesia with the patient or authorized representative who has indicated his/her understanding and acceptance.       Plan Discussed with: CRNA and Anesthesiologist  Anesthesia Plan Comments:         Anesthesia Quick Evaluation

## 2020-02-18 NOTE — Brief Op Note (Signed)
Preoperative diagnosis  1.  Acute endophthalmitis left eye  2.  Hypopyon left eye 3.  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  Diagnostic vitreous tap   4.  injection of intravitreal antibiotics left eye,  Fortaz 2.25 mg / 0.1 cc, volume 0.1 cc  Vancomycin 1.0 mg / 0.1 cc, volume 0.1 cc  Surgeon Deloria Lair, MD  Anesthesia is local retrobulbar with monitored anesthesia control  No complications  No blood loss

## 2020-02-18 NOTE — H&P (Deleted)
  The note originally documented on this encounter has been moved the the encounter in which it belongs.  

## 2020-02-18 NOTE — Progress Notes (Signed)
02/18/2020     CHIEF COMPLAINT Patient presents for Blurred Vision   HISTORY OF PRESENT ILLNESS: James Blevins is a 74 y.o. male who presents to the clinic today for:   HPI    Blurred Vision    In left eye.  Onset was sudden.  Vision is missing.  Severity is severe.  This started 1 day ago.  Context:  distance vision, mid-range vision and near vision.  Since onset it is rapidly worsening.  Associated symptoms include redness, tearing and eye pain.  Response to treatment was mild improvement.          Comments    Patient is 4 days status post injection intravitreal antiveg F OS, for advanced macular edema.  Patient had 2 days of improving vision in the left eye after injection And then sometime yesterday started developing a redness some moderate discomfort alleviated somewhat with oral nonsteroidal anti-inflammatory agents.  Today patient has progressive visual loss and called at 5 PM With inability see the left eye.       Last edited by [name], MD on 02/18/2020  5:27 PM. (History)      Referring physician: No referring provider defined for this encounter.  HISTORICAL INFORMATION:   Selected notes from the MEDICAL RECORD NUMBER    Lab Results  Component Value Date   HGBA1C 8.4 (H) 04/12/2016     CURRENT MEDICATIONS: No current outpatient medications on file. (Ophthalmic Drugs)   No current facility-administered medications for this visit. (Ophthalmic Drugs)   Current Outpatient Medications (Other)  Medication Sig   aspirin 325 MG tablet Take 1 tablet (325 mg total) by mouth daily.   atorvastatin (LIPITOR) 80 MG tablet Take 80 mg by mouth daily.   clopidogrel (PLAVIX) 75 MG tablet Take 75 mg by mouth daily.   cyanocobalamin (,VITAMIN B-12,) 1000 MCG/ML injection Please take once daily for next 7 days, then once weekly for 4 weeks, then once monthly.   glipiZIDE (GLUCOTROL) 5 MG tablet Take 0.5 tablets (2.5 mg total) by mouth 2 (two) times daily before a  meal. Please take for 2 days, then stop once or you resumed her Janumet back   sitaGLIPtin-metformin (JANUMET) 50-1000 MG tablet Take 1 tablet by mouth 2 (two) times daily with a meal. Please hold for next 2 days as you have received IV contrast during hospital stay, resume on 04/17/2016   No current facility-administered medications for this visit. (Other)      REVIEW OF SYSTEMS:    ALLERGIES No Known Allergies  PAST MEDICAL HISTORY Past Medical History:  Diagnosis Date   Cataracts, bilateral    Diabetes ([place])    Retinal detachment left eye, 08/2013   Skin cancer    Stroke [facility])    Stroke due to embolism ([place]) 01/14/2016   Past Surgical History:  Procedure Laterality Date   COLONOSCOPY  2002   EYE SURGERY     skin cancer removal     TONSILLECTOMY  childhood    FAMILY HISTORY Family History  Problem Relation Age of Onset   Clotting disorder Mother        heart attack   Stroke Maternal Grandfather        stroke   Clotting disorder Daughter        prothrombin gene mutation   Colon cancer Neg Hx    Esophageal cancer Neg Hx    Rectal cancer Neg Hx    Stomach cancer Neg Hx  SOCIAL HISTORY Social History   Tobacco Use   Smoking status: Never Smoker   Smokeless tobacco: Never Used  Substance Use Topics   Alcohol use: Yes    Comment: rare   Drug use: No         OPHTHALMIC EXAM: Base Eye Exam    Visual Acuity (ETDRS)      Right Left   Dist [place]  HM       Pupils      Pupils   Right PERRL   Left PERRL        Slit Lamp and Fundus Exam    Slit Lamp Exam      Right Left   Lids/Lashes  Inspissated meibomian glands   Conjunctiva/Sclera  1+ Chemosis, 3+ Injection   Cornea  Clear   Anterior Chamber  Fibrin, 4+ Cell,, with 1 mm hypopyon layered inferiorly   Iris  Round and reactive   Lens  Centered posterior chamber intraocular lens   Anterior Vitreous  No view through the anterior segment       Fundus Exam      Right Left    Posterior Vitreous  No view through the anterior segment          IMAGING AND PROCEDURES  Imaging and Procedures for 02/18/20           ASSESSMENT/PLAN:  Acute endophthalmitis of left eye Patient will be made n.p.o. immediately.  Patient has a history of having a last meal or consumption at 1400 hrs. Today.  I explained to patient and family that is critical that we and we suspect endophthalmitis that we proceed immediately to the surgical operating room theater whereby we will take cultures from the vitreous cavity we will do a vitreous washout and vitrectomy to debulk the infection and/or inflammation but more importantly deliver therapeutic intravitreal antibiotics today in an attempt to prevent progression of vision loss.  Patient be n.p.o. now.  We will schedule this promptly at [facility] local MAC anesthesia left eye      ICD-10-CM   1. Acute endophthalmitis of left eye  H44.002   2. Hypopyon of left eye  H20.052     1.  Patient understands stay n.p.o. and will proceed immediately to [facility] emergency room for the check-in procedures prior to planned surgery tonight left eye  2.  3.  Ophthalmic Meds Ordered this visit:  No orders of the defined types were placed in this encounter.      No follow-ups on file.  There are no Patient Instructions on file for this visit.   Explained the diagnoses, plan, and follow up with the patient and they expressed understanding.  Patient expressed understanding of the importance of proper follow up care.   [name] [name] M.D. Diseases & Surgery of the Retina and Vitreous Retina & Diabetic [place] 02/18/20     Abbreviations: M myopia (nearsighted); A astigmatism; H hyperopia (farsighted); P presbyopia; Mrx spectacle prescription;  CTL contact lenses; OD right eye; OS left eye; OU both eyes  XT exotropia; ET esotropia; PEK punctate epithelial keratitis; PEE punctate epithelial erosions; DES dry eye syndrome; MGD  meibomian gland dysfunction; ATs artificial tears; PFAT's preservative free artificial tears; [place] nuclear sclerotic cataract; PSC posterior subcapsular cataract; ERM epi-retinal membrane; PVD posterior vitreous detachment; RD retinal detachment; DM diabetes mellitus; DR diabetic retinopathy; NPDR non-proliferative diabetic retinopathy; PDR proliferative diabetic retinopathy; CSME clinically significant macular edema; DME diabetic macular edema; dbh dot blot hemorrhages; CWS cotton wool  spot; POAG primary open angle glaucoma; C/D cup-to-disc ratio; HVF humphrey visual field; GVF goldmann visual field; OCT optical coherence tomography; IOP intraocular pressure; BRVO Branch retinal vein occlusion; CRVO central retinal vein occlusion; CRAO central retinal artery occlusion; BRAO branch retinal artery occlusion; RT retinal tear; SB scleral buckle; PPV pars plana vitrectomy; VH Vitreous hemorrhage; PRP panretinal laser photocoagulation; IVK intravitreal kenalog; VMT vitreomacular traction; MH Macular hole;  NVD neovascularization of the disc; NVE neovascularization elsewhere; AREDS age related eye disease study; ARMD age related macular degeneration; POAG primary open angle glaucoma; EBMD epithelial/anterior basement membrane dystrophy; ACIOL anterior chamber intraocular lens; IOL intraocular lens; PCIOL posterior chamber intraocular lens; Phaco/IOL phacoemulsification with intraocular lens placement; [place] photorefractive keratectomy; LASIK laser assisted in situ keratomileusis; HTN hypertension; DM diabetes mellitus; COPD chronic obstructive pulmonary disease

## 2020-02-18 NOTE — H&P (Signed)
74 year old man with 1 day of declining vision with achiness over the last 24 hours in the left eye.  He is admitted for emergent vitrectomy, diagnostic vitreous tap, injection intravitreal antibiotics left eye for suspected acute endophthalmitis.  Patient is 4 days status post injection of intravitreal antivegF medication for chronic worsening macular edema of the left eye.  2 days after injection patient had improved vision and over the last 12 to 24 hours has developed increasing achy pain some blurring of vision and then this morning poor vision.  Patient was seen in the office at 5 PM found to have hypopyon vision down the hand motion and suspected acute endophthalmitis and he presents for emergent surgical intervention today of the left eye  Impression acute endophthalmitis left eye  Plan surgical intervention via vitrectomy, injection of intravitreal antibiotics after diagnostic vitreous cultures and tap are performed today in the left eye on an same day basis

## 2020-02-18 NOTE — Progress Notes (Signed)
02/18/2020     CHIEF COMPLAINT Patient presents for No chief complaint on file.   HISTORY OF PRESENT ILLNESS: James Blevins is a 74 y.o. male who presents to the clinic today for:     Referring physician: No referring provider defined for this encounter.  HISTORICAL INFORMATION:   Selected notes from the MEDICAL RECORD NUMBER    Lab Results  Component Value Date   HGBA1C 8.4 (H) 04/12/2016     CURRENT MEDICATIONS: Current Outpatient Medications (Ophthalmic Drugs)  Medication Sig   cyclopentolate (CYCLOGYL) 0.5 % ophthalmic solution Place 1 drop into the left eye in the morning, at noon, and at bedtime for 14 days.   moxifloxacin (VIGAMOX) 0.5 % ophthalmic solution Place 1 drop into the left eye 3 (three) times daily for 7 days.   prednisoLONE acetate (PRED FORTE) 1 % ophthalmic suspension Place 1 drop into the left eye 4 (four) times daily.   No current facility-administered medications for this visit. (Ophthalmic Drugs)   Current Outpatient Medications (Other)  Medication Sig   aspirin 325 MG tablet Take 1 tablet (325 mg total) by mouth daily.   atorvastatin (LIPITOR) 80 MG tablet Take 80 mg by mouth daily.   clopidogrel (PLAVIX) 75 MG tablet Take 75 mg by mouth daily.   cyanocobalamin (,VITAMIN B-12,) 1000 MCG/ML injection Please take once daily for next 7 days, then once weekly for 4 weeks, then once monthly.   glipiZIDE (GLUCOTROL) 5 MG tablet Take 0.5 tablets (2.5 mg total) by mouth 2 (two) times daily before a meal. Please take for 2 days, then stop once or you resumed her Janumet back   moxifloxacin (AVELOX) 400 MG tablet Take 1 tablet (400 mg total) by mouth daily for 5 days.   sitaGLIPtin-metformin (JANUMET) 50-1000 MG tablet Take 1 tablet by mouth 2 (two) times daily with a meal. Please hold for next 2 days as you have received IV contrast during hospital stay, resume on 04/17/2016   No current facility-administered medications for this visit.  (Other)      REVIEW OF SYSTEMS:    ALLERGIES No Known Allergies  PAST MEDICAL HISTORY Past Medical History:  Diagnosis Date   Cataracts, bilateral    Diabetes ([place])    Retinal detachment left eye, 08/2013   Skin cancer    Stroke [facility])    Stroke due to embolism ([place]) 01/14/2016   Past Surgical History:  Procedure Laterality Date   COLONOSCOPY  2002   EYE SURGERY     skin cancer removal     TONSILLECTOMY  childhood    FAMILY HISTORY Family History  Problem Relation Age of Onset   Clotting disorder Mother        heart attack   Stroke Maternal Grandfather        stroke   Clotting disorder Daughter        prothrombin gene mutation   Colon cancer Neg Hx    Esophageal cancer Neg Hx    Rectal cancer Neg Hx    Stomach cancer Neg Hx     SOCIAL HISTORY Social History   Tobacco Use   Smoking status: Never Smoker   Smokeless tobacco: Never Used  Substance Use Topics   Alcohol use: Yes    Comment: rare   Drug use: No         OPHTHALMIC EXAM: Not recorded     IMAGING AND PROCEDURES  Imaging and Procedures for 02/18/20  ASSESSMENT/PLAN:  No problem-specific Assessment & Plan notes found for this encounter.      ICD-10-CM   1. Acute endophthalmitis of left eye  H44.002   2. Hypopyon of left eye  H20.052   3. Severe nonproliferative diabetic retinopathy of left eye, with macular edema, associated with type 2 diabetes mellitus ([place])  X72.6203   4. Severe nonproliferative diabetic retinopathy of right eye, with macular edema, associated with type 2 diabetes mellitus ([place])  E11.3411     1.  This note is an addendum to the note done earlier this evening  2.  Risk benefits were reviewed with the patient and family.  3.  Ophthalmic Meds Ordered this visit:  Meds ordered this encounter  Medications   moxifloxacin (AVELOX) 400 MG tablet    Sig: Take 1 tablet (400 mg total) by mouth daily for 5 days.    Dispense:  7  tablet    Refill:  0   moxifloxacin (VIGAMOX) 0.5 % ophthalmic solution    Sig: Place 1 drop into the left eye 3 (three) times daily for 7 days.    Dispense:  1.1 mL    Refill:  1   prednisoLONE acetate (PRED FORTE) 1 % ophthalmic suspension    Sig: Place 1 drop into the left eye 4 (four) times daily.    Dispense:  10 mL    Refill:  0   cyclopentolate (CYCLOGYL) 0.5 % ophthalmic solution    Sig: Place 1 drop into the left eye in the morning, at noon, and at bedtime for 14 days.    Dispense:  5 mL    Refill:  0       Return Patient to proceed to current ER for operation tonight, for Plan vitrectomy, vitreous tap, instillation intravitreal antibiotics left eye local MAC.  There are no Patient Instructions on file for this visit.   Explained the diagnoses, plan, and follow up with the patient and they expressed understanding.  Patient expressed understanding of the importance of proper follow up care.   [name] Charlott Calvario M.D. Diseases & Surgery of the Retina and Vitreous Retina & Diabetic [place] 02/18/20     Abbreviations: M myopia (nearsighted); A astigmatism; H hyperopia (farsighted); P presbyopia; Mrx spectacle prescription;  CTL contact lenses; OD right eye; OS left eye; OU both eyes  XT exotropia; ET esotropia; PEK punctate epithelial keratitis; PEE punctate epithelial erosions; DES dry eye syndrome; MGD meibomian gland dysfunction; ATs artificial tears; PFAT's preservative free artificial tears; [place] nuclear sclerotic cataract; PSC posterior subcapsular cataract; ERM epi-retinal membrane; PVD posterior vitreous detachment; RD retinal detachment; DM diabetes mellitus; DR diabetic retinopathy; NPDR non-proliferative diabetic retinopathy; PDR proliferative diabetic retinopathy; CSME clinically significant macular edema; DME diabetic macular edema; dbh dot blot hemorrhages; CWS cotton wool spot; POAG primary open angle glaucoma; C/D cup-to-disc ratio; HVF humphrey visual field;  GVF goldmann visual field; OCT optical coherence tomography; IOP intraocular pressure; BRVO Branch retinal vein occlusion; CRVO central retinal vein occlusion; CRAO central retinal artery occlusion; BRAO branch retinal artery occlusion; RT retinal tear; SB scleral buckle; PPV pars plana vitrectomy; VH Vitreous hemorrhage; PRP panretinal laser photocoagulation; IVK intravitreal kenalog; VMT vitreomacular traction; MH Macular hole;  NVD neovascularization of the disc; NVE neovascularization elsewhere; AREDS age related eye disease study; ARMD age related macular degeneration; POAG primary open angle glaucoma; EBMD epithelial/anterior basement membrane dystrophy; ACIOL anterior chamber intraocular lens; IOL intraocular lens; PCIOL posterior chamber intraocular lens; Phaco/IOL phacoemulsification with intraocular lens placement; PRK photorefractive  keratectomy; LASIK laser assisted in situ keratomileusis; HTN hypertension; DM diabetes mellitus; COPD chronic obstructive pulmonary disease

## 2020-02-19 ENCOUNTER — Encounter (HOSPITAL_COMMUNITY): Payer: Self-pay | Admitting: Ophthalmology

## 2020-02-19 NOTE — Anesthesia Postprocedure Evaluation (Signed)
Anesthesia Post Note  Patient: James Blevins  Procedure(s) Performed: PARS PLANA VITRECTOMY 25 GAUGE FOR ENDOPHTHALMITIS, ENDOPTHALMITIS PROTOCOL WITH ANTIBIOTIC INJECTION (Left Eye)     Patient location during evaluation: PACU Anesthesia Type: MAC Level of consciousness: awake and alert Pain management: pain level controlled Vital Signs Assessment: post-procedure vital signs reviewed and stable Respiratory status: spontaneous breathing, nonlabored ventilation, respiratory function stable and patient connected to nasal cannula oxygen Cardiovascular status: stable and blood pressure returned to baseline Postop Assessment: no apparent nausea or vomiting Anesthetic complications: no   No complications documented.  Last Vitals:  Vitals:   02/18/20 2106 02/18/20 2121  BP: (!) 166/70 (!) 163/77  Pulse: 87 67  Resp: 19 15  Temp:  36.7 C  SpO2: 99% 98%    Last Pain:  Vitals:   02/18/20 2106  PainSc: 0-No pain                 Inioluwa Boulay COKER

## 2020-02-20 ENCOUNTER — Encounter (INDEPENDENT_AMBULATORY_CARE_PROVIDER_SITE_OTHER): Payer: Self-pay | Admitting: Ophthalmology

## 2020-02-20 ENCOUNTER — Other Ambulatory Visit: Payer: Self-pay

## 2020-02-20 ENCOUNTER — Ambulatory Visit (INDEPENDENT_AMBULATORY_CARE_PROVIDER_SITE_OTHER): Payer: Medicare Other | Admitting: Ophthalmology

## 2020-02-20 ENCOUNTER — Inpatient Hospital Stay: Admit: 2020-02-20 | Payer: Medicare Other | Admitting: Ophthalmology

## 2020-02-20 DIAGNOSIS — H20052 Hypopyon, left eye: Secondary | ICD-10-CM

## 2020-02-20 DIAGNOSIS — H44002 Unspecified purulent endophthalmitis, left eye: Secondary | ICD-10-CM

## 2020-02-20 MED ORDER — ATROPINE SULFATE 1 % OP SOLN
1.0000 [drp] | Freq: Two times a day (BID) | OPHTHALMIC | 12 refills | Status: AC
Start: 2020-02-20 — End: ?

## 2020-02-20 NOTE — Assessment & Plan Note (Signed)
Overall improved appearance OS with some clearance and some old debris inferiorly in the anterior chamber yet still clear posteriorly.  Less pain is also a favorable sign.  I explained the patient that between day 2 and 3 is critical for follow-up.  If continued improvement is apparent tomorrow morning's examination, will continue with our current therapy if there is any worsening of the inflammation and possible residual infection, we may proceed tomorrow to vitreous tap and a reinjection and reinstallation of antibiotics intravitreal

## 2020-02-20 NOTE — Progress Notes (Signed)
02/20/2020     CHIEF COMPLAINT Patient presents for Post-op Follow-up   HISTORY OF PRESENT ILLNESS: James Blevins is a 74 y.o. male who presents to the clinic today for:   HPI    Post-op Follow-up    In left eye.  Discomfort includes pain.          Comments      36 to 40 hours post-op S/p vitrectomy, vitreous tap, instillation intravitreal antibiotics left eye local MAC 02/18/2020 Patient states that yesterday was rough and he had some pain, but today is a lot better. LBS 154 this AM  Patient is on Avelox 400 mg p.o. daily, and topical Vigamox 4 times daily left eye and commenced with Pred forte left eye also 4 times daily as of yesterday       Last edited by [name], MD on 02/20/2020 11:12 AM. (History)      Referring physician: [name], MD [place],  [place] [address]  HISTORICAL INFORMATION:   Selected notes from the [facility]    Lab Results  Component Value Date   HGBA1C 8.4 (H) 04/12/2016     CURRENT MEDICATIONS: Current Outpatient Medications (Ophthalmic Drugs)  Medication Sig  . cyclopentolate (CYCLOGYL) 0.5 % ophthalmic solution Place 1 drop into the left eye in the morning, at noon, and at bedtime.  . moxifloxacin (VIGAMOX) 0.5 % ophthalmic solution Place 1 drop into the left eye 3 (three) times daily for 7 days.  . prednisoLONE acetate (PRED FORTE) 1 % ophthalmic suspension Place 1 drop into the left eye 4 (four) times daily.   Current Facility-Administered Medications (Ophthalmic Drugs)  Medication Route  . cyclopentolate (CYCLODRYL,CYCLOGYL) 1 % ophthalmic solution 1 drop Left Eye   Current Outpatient Medications (Other)  Medication Sig  . aspirin 325 MG tablet Take 1 tablet (325 mg total) by mouth daily.  [name] atorvastatin (LIPITOR) 80 MG tablet Take 80 mg by mouth daily.  . clopidogrel (PLAVIX) 75 MG tablet Take 75 mg by mouth daily.  . cyanocobalamin (,VITAMIN B-12,) 1000 MCG/ML injection Please take once  daily for next 7 days, then once weekly for 4 weeks, then once monthly.  [name] glipiZIDE (GLUCOTROL) 5 MG tablet Take 0.5 tablets (2.5 mg total) by mouth 2 (two) times daily before a meal. Please take for 2 days, then stop once or you resumed her Janumet back  . moxifloxacin (AVELOX) 400 MG tablet Take 1 tablet (400 mg total) by mouth daily for 5 days.  . sitaGLIPtin-metformin (JANUMET) 50-1000 MG tablet Take 1 tablet by mouth 2 (two) times daily with a meal. Please hold for next 2 days as you have received IV contrast during hospital stay, resume on 04/17/2016   No current facility-administered medications for this visit. (Other)      REVIEW OF SYSTEMS:    ALLERGIES No Known Allergies  PAST MEDICAL HISTORY Past Medical History:  Diagnosis Date  . Cataracts, bilateral   . Diabetes ([place])   . Retinal detachment left eye, 08/2013  . Skin cancer   . Stroke ([place])   . Stroke due to embolism ([facility]) 01/14/2016   Past Surgical History:  Procedure Laterality Date  . COLONOSCOPY  2002  . EYE SURGERY    . PARS PLANA VITRECTOMY Left 02/18/2020   Procedure: PARS PLANA VITRECTOMY 25 GAUGE FOR ENDOPHTHALMITIS, ENDOPTHALMITIS PROTOCOL WITH ANTIBIOTIC INJECTION;  Surgeon: [name], MD;  Location: [facility];  Service: Ophthalmology;  Laterality: Left;  . skin  cancer removal    . TONSILLECTOMY  childhood    FAMILY HISTORY Family History  Problem Relation Age of Onset  . Clotting disorder Mother        heart attack  . Stroke Maternal Grandfather        stroke  . Clotting disorder Daughter        prothrombin gene mutation  . Colon cancer Neg Hx   . Esophageal cancer Neg Hx   . Rectal cancer Neg Hx   . Stomach cancer Neg Hx     SOCIAL HISTORY Social History   Tobacco Use  . Smoking status: Never Smoker  . Smokeless tobacco: Never Used  Substance Use Topics  . Alcohol use: Yes    Comment: rare  . Drug use: No         OPHTHALMIC EXAM:  Base Eye Exam    Visual Acuity (Snellen -  Linear)      Right Left   Dist [place] 20/20-2 HM       Tonometry (Tonopen, 10:46 AM)      Right Left   Pressure 16 10       Neuro/Psych    Oriented x3: Yes   Mood/Affect: Normal       Dilation    Left eye: 1.0% Mydriacyl, 2.5% Phenylephrine @ 10:46 AM        Slit Lamp and Fundus Exam    External Exam      Right Left   External Normal Normal       Slit Lamp Exam      Right Left   Lids/Lashes Normal Normal   Conjunctiva/Sclera White and quiet 2+ Injection   Cornea Clear clear   Anterior Chamber Deep and quiet 1 mm hypopyon   Iris Round and reactive Round and reactive   Lens Posterior chamber intraocular lens Posterior chamber intraocular lens   Anterior Vitreous Normal Normal       Fundus Exam      Right Left   Posterior Vitreous  2+ Cells   Disc  no details   Macula  no details          IMAGING AND PROCEDURES  Imaging and Procedures for 02/20/20           ASSESSMENT/PLAN:  Acute endophthalmitis of left eye Overall improved appearance OS with some clearance and some old debris inferiorly in the anterior chamber yet still clear posteriorly.  Less pain is also a favorable sign.  I explained the patient that between day 2 and 3 is critical for follow-up.  If continued improvement is apparent tomorrow morning's examination, will continue with our current therapy if there is any worsening of the inflammation and possible residual infection, we may proceed tomorrow to vitreous tap and a reinjection and reinstallation of antibiotics intravitreal      ICD-10-CM   1. Acute endophthalmitis of left eye  H44.002   2. Hypopyon of left eye  H20.052     1.  2.  3.  Ophthalmic Meds Ordered this visit:  No orders of the defined types were placed in this encounter.      Return in about 1 day (around 02/21/2020), or ,, Patient will be n.p.o. past midnight, for OS, dilate.  There are no Patient Instructions on file for this visit.   Explained the diagnoses,  plan, and follow up with the patient and they expressed understanding.  Patient expressed understanding of the importance of proper follow up care.   [name]  Ilija Maxim M.D. Diseases & Surgery of the Retina and Vitreous Retina & Diabetic [facility] 02/20/20     Abbreviations: M myopia (nearsighted); A astigmatism; H hyperopia (farsighted); P presbyopia; Mrx spectacle prescription;  CTL contact lenses; OD right eye; OS left eye; OU both eyes  XT exotropia; ET esotropia; PEK punctate epithelial keratitis; PEE punctate epithelial erosions; DES dry eye syndrome; MGD meibomian gland dysfunction; ATs artificial tears; PFAT's preservative free artificial tears; [place] nuclear sclerotic cataract; PSC posterior subcapsular cataract; ERM epi-retinal membrane; PVD posterior vitreous detachment; RD retinal detachment; DM diabetes mellitus; DR diabetic retinopathy; NPDR non-proliferative diabetic retinopathy; PDR proliferative diabetic retinopathy; CSME clinically significant macular edema; DME diabetic macular edema; dbh dot blot hemorrhages; CWS cotton wool spot; POAG primary open angle glaucoma; C/D cup-to-disc ratio; HVF humphrey visual field; GVF goldmann visual field; OCT optical coherence tomography; IOP intraocular pressure; BRVO Branch retinal vein occlusion; CRVO central retinal vein occlusion; CRAO central retinal artery occlusion; BRAO branch retinal artery occlusion; RT retinal tear; SB scleral buckle; PPV pars plana vitrectomy; VH Vitreous hemorrhage; PRP panretinal laser photocoagulation; IVK intravitreal kenalog; VMT vitreomacular traction; MH Macular hole;  NVD neovascularization of the disc; NVE neovascularization elsewhere; AREDS age related eye disease study; ARMD age related macular degeneration; POAG primary open angle glaucoma; EBMD epithelial/anterior basement membrane dystrophy; ACIOL anterior chamber intraocular lens; IOL intraocular lens; PCIOL posterior chamber intraocular lens; Phaco/IOL  phacoemulsification with intraocular lens placement; [place] photorefractive keratectomy; LASIK laser assisted in situ keratomileusis; HTN hypertension; DM diabetes mellitus; COPD chronic obstructive pulmonary disease

## 2020-02-20 NOTE — Assessment & Plan Note (Signed)
Postop day #1 cultures were negative for no growth.  Gram stain did show gram-positive cocci

## 2020-02-20 NOTE — Addendum Note (Signed)
Addended by: Deloria Lair A on: 02/20/2020 05:15 PM   Modules accepted: Orders

## 2020-02-21 ENCOUNTER — Ambulatory Visit (INDEPENDENT_AMBULATORY_CARE_PROVIDER_SITE_OTHER): Payer: Medicare Other | Admitting: Ophthalmology

## 2020-02-21 ENCOUNTER — Encounter (INDEPENDENT_AMBULATORY_CARE_PROVIDER_SITE_OTHER): Payer: Self-pay | Admitting: Ophthalmology

## 2020-02-21 DIAGNOSIS — H44002 Unspecified purulent endophthalmitis, left eye: Secondary | ICD-10-CM

## 2020-02-21 NOTE — Progress Notes (Signed)
02/21/2020     CHIEF COMPLAINT Patient presents for Post-op Follow-up   HISTORY OF PRESENT ILLNESS: James Blevins is a 74 y.o. male who presents to the clinic today for:   HPI    Post-op Follow-up    In left eye.  Discomfort includes Negative for pain.  Vision is stable.          Comments    1 day follow up - PO  Patient states that the pressure that he was feeling yesterday has subsided. Patient has no complaints.       Last edited by James Blevins on 02/21/2020  9:36 AM. (History)      Referring physician: [name], MD [name],  [place] [address]  HISTORICAL INFORMATION:   Selected notes from the MEDICAL RECORD NUMBER    Lab Results  Component Value Date   HGBA1C 8.4 (H) 04/12/2016     CURRENT MEDICATIONS: Current Outpatient Medications (Ophthalmic Drugs)  Medication Sig  . atropine 1 % ophthalmic solution Place 1 drop into the left eye 2 (two) times daily.  [name] moxifloxacin (VIGAMOX) 0.5 % ophthalmic solution Place 1 drop into the left eye 3 (three) times daily for 7 days.  . prednisoLONE acetate (PRED FORTE) 1 % ophthalmic suspension Place 1 drop into the left eye 4 (four) times daily.   No current facility-administered medications for this visit. (Ophthalmic Drugs)   Current Outpatient Medications (Other)  Medication Sig  . aspirin 325 MG tablet Take 1 tablet (325 mg total) by mouth daily.  [name] atorvastatin (LIPITOR) 80 MG tablet Take 80 mg by mouth daily.  . clopidogrel (PLAVIX) 75 MG tablet Take 75 mg by mouth daily.  . cyanocobalamin (,VITAMIN B-12,) 1000 MCG/ML injection Please take once daily for next 7 days, then once weekly for 4 weeks, then once monthly.  [name] glipiZIDE (GLUCOTROL) 5 MG tablet Take 0.5 tablets (2.5 mg total) by mouth 2 (two) times daily before a meal. Please take for 2 days, then stop once or you resumed her Janumet back  . moxifloxacin (AVELOX) 400 MG tablet Take 1 tablet (400 mg total) by mouth daily for 5 days.    . sitaGLIPtin-metformin (JANUMET) 50-1000 MG tablet Take 1 tablet by mouth 2 (two) times daily with a meal. Please hold for next 2 days as you have received IV contrast during hospital stay, resume on 04/17/2016   No current facility-administered medications for this visit. (Other)      REVIEW OF SYSTEMS:    ALLERGIES No Known Allergies  PAST MEDICAL HISTORY Past Medical History:  Diagnosis Date  . Cataracts, bilateral   . Diabetes ([place])   . Retinal detachment left eye, 08/2013  . Skin cancer   . Stroke ([place])   . Stroke due to embolism ([facility]) 01/14/2016   Past Surgical History:  Procedure Laterality Date  . COLONOSCOPY  2002  . EYE SURGERY    . PARS PLANA VITRECTOMY Left 02/18/2020   Procedure: PARS PLANA VITRECTOMY 25 GAUGE FOR ENDOPHTHALMITIS, ENDOPTHALMITIS PROTOCOL WITH ANTIBIOTIC INJECTION;  Surgeon: James Horn, MD;  Location: [facility];  Service: Ophthalmology;  Laterality: Left;  . skin cancer removal    . TONSILLECTOMY  childhood    FAMILY HISTORY Family History  Problem Relation Age of Onset  . Clotting disorder Mother        heart attack  . Stroke Maternal Grandfather        stroke  . Clotting disorder Daughter  prothrombin gene mutation  . Colon cancer Neg Hx   . Esophageal cancer Neg Hx   . Rectal cancer Neg Hx   . Stomach cancer Neg Hx     SOCIAL HISTORY Social History   Tobacco Use  . Smoking status: Never Smoker  . Smokeless tobacco: Never Used  Substance Use Topics  . Alcohol use: Yes    Comment: rare  . Drug use: No         OPHTHALMIC EXAM:  Base Eye Exam    Visual Acuity (Snellen - Linear)      Right Left   Dist [place] 20/20 HM       Tonometry (Tonopen, 9:40 AM)      Right Left   Pressure 10 7       Neuro/Psych    Oriented x3: Yes   Mood/Affect: Normal       Dilation    Left eye: 1.0% Mydriacyl, 2.5% Phenylephrine @ 9:40 AM        Slit Lamp and Fundus Exam    External Exam      Right Left   External Normal  Normal       Slit Lamp Exam      Right Left   Lids/Lashes Normal Normal   Conjunctiva/Sclera White and quiet 2+ Injection   Cornea Clear Clear   Anterior Chamber Deep and quiet 2+ Cell,, smaller hypopyon   Iris Round and reactive Round and reactive   Lens Posterior chamber intraocular lens Posterior chamber intraocular lens   Anterior Vitreous Normal 2+ Cells       Fundus Exam      Right Left   Posterior Vitreous  cells, no change          IMAGING AND PROCEDURES  Imaging and Procedures for 02/21/20  B-Scan Ultrasound - OS - Left Eye       Quality was good. Findings included vitreous opacities.   Notes Vitreous strands and opacities in the left eye however  no retinal detachment in all quadrants                ASSESSMENT/PLAN:  Acute endophthalmitis of left eye Overall condition improving now day 3 status post vitreous tap, limited vitrectomy, and injection of intravitreal antibiotics.  Organism grew out late yesterday is rare staphylococcal epidermidis.  Patient is to complete oral Avelox tomorrow.  Patient will stay on probiotics for another week.  To continue on the topical Vigamox and Pred forte left eye  Follow-up in 2 days      ICD-10-CM   1. Acute endophthalmitis of left eye  H44.002 B-Scan Ultrasound - OS - Left Eye    1.  Return follow-up visit in 2 days  2.  3.  Ophthalmic Meds Ordered this visit:  No orders of the defined types were placed in this encounter.      No follow-ups on file.  There are no Patient Instructions on file for this visit.   Explained the diagnoses, plan, and follow up with the patient and they expressed understanding.  Patient expressed understanding of the importance of proper follow up care.   James Blevins M.D. Diseases & Surgery of the Retina and Vitreous Retina & Diabetic [name] 02/21/20     Abbreviations: M myopia (nearsighted); A astigmatism; H hyperopia (farsighted); P presbyopia; Mrx  spectacle prescription;  CTL contact lenses; OD right eye; OS left eye; OU both eyes  XT exotropia; ET esotropia; PEK punctate epithelial keratitis; PEE punctate epithelial  erosions; DES dry eye syndrome; MGD meibomian gland dysfunction; ATs artificial tears; PFAT's preservative free artificial tears; [place] nuclear sclerotic cataract; PSC posterior subcapsular cataract; ERM epi-retinal membrane; PVD posterior vitreous detachment; RD retinal detachment; DM diabetes mellitus; DR diabetic retinopathy; NPDR non-proliferative diabetic retinopathy; PDR proliferative diabetic retinopathy; CSME clinically significant macular edema; DME diabetic macular edema; dbh dot blot hemorrhages; CWS cotton wool spot; POAG primary open angle glaucoma; C/D cup-to-disc ratio; HVF humphrey visual field; GVF goldmann visual field; OCT optical coherence tomography; IOP intraocular pressure; BRVO Branch retinal vein occlusion; CRVO central retinal vein occlusion; CRAO central retinal artery occlusion; BRAO branch retinal artery occlusion; RT retinal tear; SB scleral buckle; PPV pars plana vitrectomy; VH Vitreous hemorrhage; PRP panretinal laser photocoagulation; IVK intravitreal kenalog; VMT vitreomacular traction; MH Macular hole;  NVD neovascularization of the disc; NVE neovascularization elsewhere; AREDS age related eye disease study; ARMD age related macular degeneration; POAG primary open angle glaucoma; EBMD epithelial/anterior basement membrane dystrophy; ACIOL anterior chamber intraocular lens; IOL intraocular lens; PCIOL posterior chamber intraocular lens; Phaco/IOL phacoemulsification with intraocular lens placement; [place] photorefractive keratectomy; LASIK laser assisted in situ keratomileusis; HTN hypertension; DM diabetes mellitus; COPD chronic obstructive pulmonary disease

## 2020-02-21 NOTE — Assessment & Plan Note (Signed)
Overall condition improving now day 3 status post vitreous tap, limited vitrectomy, and injection of intravitreal antibiotics.  Organism grew out late yesterday is rare staphylococcal epidermidis.  Patient is to complete oral Avelox tomorrow.  Patient will stay on probiotics for another week.  To continue on the topical Vigamox and Pred forte left eye  Follow-up in 2 days

## 2020-02-23 ENCOUNTER — Other Ambulatory Visit: Payer: Self-pay

## 2020-02-23 ENCOUNTER — Encounter (INDEPENDENT_AMBULATORY_CARE_PROVIDER_SITE_OTHER): Payer: Self-pay | Admitting: Ophthalmology

## 2020-02-23 ENCOUNTER — Ambulatory Visit (INDEPENDENT_AMBULATORY_CARE_PROVIDER_SITE_OTHER): Payer: Medicare Other | Admitting: Ophthalmology

## 2020-02-23 DIAGNOSIS — H20052 Hypopyon, left eye: Secondary | ICD-10-CM

## 2020-02-23 DIAGNOSIS — H44002 Unspecified purulent endophthalmitis, left eye: Secondary | ICD-10-CM

## 2020-02-23 LAB — AEROBIC/ANAEROBIC CULTURE W GRAM STAIN (SURGICAL/DEEP WOUND)

## 2020-02-23 NOTE — Progress Notes (Signed)
02/23/2020     CHIEF COMPLAINT Patient presents for Post-op Follow-up   HISTORY OF PRESENT ILLNESS: James Blevins is a 74 y.o. male who presents to the clinic today for:   HPI    Post-op Follow-up    In left eye.  Discomfort includes pain.  Vision is stable.          Comments    2 day follow up - PO OS Patient states that he can see from the lower part of his vision and overall has no complaints. LBS 78 A1C 8.2 6 months ago        Last edited by [name] on 02/23/2020  9:54 AM. (History)      Referring physician: [name], MD [place],  [place] [address]  HISTORICAL INFORMATION:   Selected notes from the MEDICAL RECORD NUMBER    Lab Results  Component Value Date   HGBA1C 8.4 (H) 04/12/2016     CURRENT MEDICATIONS: Current Outpatient Medications (Ophthalmic Drugs)  Medication Sig  . atropine 1 % ophthalmic solution Place 1 drop into the left eye 2 (two) times daily.  [name] moxifloxacin (VIGAMOX) 0.5 % ophthalmic solution Place 1 drop into the left eye 3 (three) times daily for 7 days.  . prednisoLONE acetate (PRED FORTE) 1 % ophthalmic suspension Place 1 drop into the left eye 4 (four) times daily.   No current facility-administered medications for this visit. (Ophthalmic Drugs)   Current Outpatient Medications (Other)  Medication Sig  . aspirin 325 MG tablet Take 1 tablet (325 mg total) by mouth daily.  [name] atorvastatin (LIPITOR) 80 MG tablet Take 80 mg by mouth daily.  . clopidogrel (PLAVIX) 75 MG tablet Take 75 mg by mouth daily.  . cyanocobalamin (,VITAMIN B-12,) 1000 MCG/ML injection Please take once daily for next 7 days, then once weekly for 4 weeks, then once monthly.  [name] glipiZIDE (GLUCOTROL) 5 MG tablet Take 0.5 tablets (2.5 mg total) by mouth 2 (two) times daily before a meal. Please take for 2 days, then stop once or you resumed her Janumet back  . moxifloxacin (AVELOX) 400 MG tablet Take 1 tablet (400 mg total) by mouth daily  for 5 days.  . sitaGLIPtin-metformin (JANUMET) 50-1000 MG tablet Take 1 tablet by mouth 2 (two) times daily with a meal. Please hold for next 2 days as you have received IV contrast during hospital stay, resume on 04/17/2016   No current facility-administered medications for this visit. (Other)      REVIEW OF SYSTEMS:    ALLERGIES No Known Allergies  PAST MEDICAL HISTORY Past Medical History:  Diagnosis Date  . Cataracts, bilateral   . Diabetes ([place])   . Retinal detachment left eye, 08/2013  . Skin cancer   . Stroke ([facility])   . Stroke due to embolism ([facility]) 01/14/2016   Past Surgical History:  Procedure Laterality Date  . COLONOSCOPY  2002  . EYE SURGERY    . PARS PLANA VITRECTOMY Left 02/18/2020   Procedure: PARS PLANA VITRECTOMY 25 GAUGE FOR ENDOPHTHALMITIS, ENDOPTHALMITIS PROTOCOL WITH ANTIBIOTIC INJECTION;  Surgeon: [name], MD;  Location: [facility];  Service: Ophthalmology;  Laterality: Left;  . skin cancer removal    . TONSILLECTOMY  childhood    FAMILY HISTORY Family History  Problem Relation Age of Onset  . Clotting disorder Mother        heart attack  . Stroke Maternal Grandfather        stroke  .  Clotting disorder Daughter        prothrombin gene mutation  . Colon cancer Neg Hx   . Esophageal cancer Neg Hx   . Rectal cancer Neg Hx   . Stomach cancer Neg Hx     SOCIAL HISTORY Social History   Tobacco Use  . Smoking status: Never Smoker  . Smokeless tobacco: Never Used  Substance Use Topics  . Alcohol use: Yes    Comment: rare  . Drug use: No         OPHTHALMIC EXAM: Base Eye Exam    Visual Acuity (Snellen - Linear)      Right Left   Dist [place] 20/25-1 HM       Tonometry (Tonopen, 9:54 AM)      Right Left   Pressure 8 5       Neuro/Psych    Oriented x3: Yes   Mood/Affect: Normal       Dilation    Left eye: 1.0% Mydriacyl, 2.5% Phenylephrine @ 9:54 AM        Slit Lamp and Fundus Exam    External Exam      Right Left   External  Normal Normal       Slit Lamp Exam      Right Left   Lids/Lashes Normal Normal   Conjunctiva/Sclera White and quiet White and quiet   Cornea Clear Clear   Anterior Chamber Deep and quiet 1+ Cell,, hypopyon   Iris Round and reactive Round and reactive   Lens Posterior chamber intraocular lens Posterior chamber intraocular lens   Anterior Vitreous Normal 2+ Cells       Fundus Exam      Right Left   Posterior Vitreous  2+ Cells, Membranes   Macula  Red reflex diffusely, no details          IMAGING AND PROCEDURES  Imaging and Procedures for 02/23/20           ASSESSMENT/PLAN:  Hypopyon of left eye Resolved OS, further evidence of clearing  Acute endophthalmitis of left eye This post vitrectomy, vitreous tap, injection intravitreal antibiotics, 7 days.  Staphylococcal epidermidis rare organisms on culture.  Sensitive to vancomycin which was injected at the time of surgery.  Secondary inflammatory response is continuing to clear today.  It is that there is no need today or in the near future for secondary vitrectomy to clear the residual debris as it appears to be clearing nicely now.      ICD-10-CM   1. Hypopyon of left eye  H20.052   2. Acute endophthalmitis of left eye  H44.002     1.  Tasks 2 more of 7 tablets of Avelox.  He will complete this.  He will also continue his p.o. probiotics in order to prevent C. difficile superinfection colonic  2.  Use the topical atropine (red top) on an as-needed basis for achy pain.  3.  Complete the current topical drops of moxifloxacin 3 times daily and prednisolone acetate 4 times daily.  Ophthalmic Meds Ordered this visit:  No orders of the defined types were placed in this encounter.      Return in about 1 week (around 03/01/2020) for COLOR FP.  There are no Patient Instructions on file for this visit.   Explained the diagnoses, plan, and follow up with the patient and they expressed understanding.  Patient  expressed understanding of the importance of proper follow up care.   [name] Rylyn Ranganathan M.D. Diseases & Surgery  of the Retina and Vitreous Retina & Diabetic [place] 02/23/20     Abbreviations: M myopia (nearsighted); A astigmatism; H hyperopia (farsighted); P presbyopia; Mrx spectacle prescription;  CTL contact lenses; OD right eye; OS left eye; OU both eyes  XT exotropia; ET esotropia; PEK punctate epithelial keratitis; PEE punctate epithelial erosions; DES dry eye syndrome; MGD meibomian gland dysfunction; ATs artificial tears; PFAT's preservative free artificial tears; [place] nuclear sclerotic cataract; PSC posterior subcapsular cataract; ERM epi-retinal membrane; PVD posterior vitreous detachment; RD retinal detachment; DM diabetes mellitus; DR diabetic retinopathy; NPDR non-proliferative diabetic retinopathy; PDR proliferative diabetic retinopathy; CSME clinically significant macular edema; DME diabetic macular edema; dbh dot blot hemorrhages; CWS cotton wool spot; POAG primary open angle glaucoma; C/D cup-to-disc ratio; HVF humphrey visual field; GVF goldmann visual field; OCT optical coherence tomography; IOP intraocular pressure; BRVO Branch retinal vein occlusion; CRVO central retinal vein occlusion; CRAO central retinal artery occlusion; BRAO branch retinal artery occlusion; RT retinal tear; SB scleral buckle; PPV pars plana vitrectomy; VH Vitreous hemorrhage; PRP panretinal laser photocoagulation; IVK intravitreal kenalog; VMT vitreomacular traction; MH Macular hole;  NVD neovascularization of the disc; NVE neovascularization elsewhere; AREDS age related eye disease study; ARMD age related macular degeneration; POAG primary open angle glaucoma; EBMD epithelial/anterior basement membrane dystrophy; ACIOL anterior chamber intraocular lens; IOL intraocular lens; PCIOL posterior chamber intraocular lens; Phaco/IOL phacoemulsification with intraocular lens placement; [place] photorefractive keratectomy;  LASIK laser assisted in situ keratomileusis; HTN hypertension; DM diabetes mellitus; COPD chronic obstructive pulmonary disease

## 2020-02-23 NOTE — Assessment & Plan Note (Signed)
Resolved OS, further evidence of clearing

## 2020-02-23 NOTE — Assessment & Plan Note (Signed)
This post vitrectomy, vitreous tap, injection intravitreal antibiotics, 7 days.  Staphylococcal epidermidis rare organisms on culture.  Sensitive to vancomycin which was injected at the time of surgery.  Secondary inflammatory response is continuing to clear today.  It is that there is no need today or in the near future for secondary vitrectomy to clear the residual debris as it appears to be clearing nicely now.

## 2020-03-01 ENCOUNTER — Ambulatory Visit (INDEPENDENT_AMBULATORY_CARE_PROVIDER_SITE_OTHER): Payer: Medicare Other | Admitting: Ophthalmology

## 2020-03-01 ENCOUNTER — Other Ambulatory Visit: Payer: Self-pay

## 2020-03-01 ENCOUNTER — Encounter (INDEPENDENT_AMBULATORY_CARE_PROVIDER_SITE_OTHER): Payer: Self-pay | Admitting: Ophthalmology

## 2020-03-01 DIAGNOSIS — E113411 Type 2 diabetes mellitus with severe nonproliferative diabetic retinopathy with macular edema, right eye: Secondary | ICD-10-CM | POA: Diagnosis not present

## 2020-03-01 DIAGNOSIS — H44002 Unspecified purulent endophthalmitis, left eye: Secondary | ICD-10-CM

## 2020-03-01 DIAGNOSIS — E113412 Type 2 diabetes mellitus with severe nonproliferative diabetic retinopathy with macular edema, left eye: Secondary | ICD-10-CM

## 2020-03-01 NOTE — Progress Notes (Signed)
03/01/2020     CHIEF COMPLAINT Patient presents for Post-op Follow-up   HISTORY OF PRESENT ILLNESS: James Blevins is a 74 y.o. male who presents to the clinic today for:   HPI    Post-op Follow-up    In left eye.  Discomfort includes Negative for itching and pain.  Vision is stable.          Comments    1 week follow up - FP OU Patient states that his eye is much better. Patient states he is 50% better.       Last edited by [name] on 03/01/2020  3:21 PM. (History)      Referring physician: [name], MD [facility],  [place] [address]  HISTORICAL INFORMATION:   Selected notes from the MEDICAL RECORD NUMBER    Lab Results  Component Value Date   HGBA1C 8.4 (H) 04/12/2016     CURRENT MEDICATIONS: Current Outpatient Medications (Ophthalmic Drugs)  Medication Sig  . atropine 1 % ophthalmic solution Place 1 drop into the left eye 2 (two) times daily.  . prednisoLONE acetate (PRED FORTE) 1 % ophthalmic suspension Place 1 drop into the left eye 4 (four) times daily.   No current facility-administered medications for this visit. (Ophthalmic Drugs)   Current Outpatient Medications (Other)  Medication Sig  . aspirin 325 MG tablet Take 1 tablet (325 mg total) by mouth daily.  [name] atorvastatin (LIPITOR) 80 MG tablet Take 80 mg by mouth daily.  . clopidogrel (PLAVIX) 75 MG tablet Take 75 mg by mouth daily.  . cyanocobalamin (,VITAMIN B-12,) 1000 MCG/ML injection Please take once daily for next 7 days, then once weekly for 4 weeks, then once monthly.  [name] glipiZIDE (GLUCOTROL) 5 MG tablet Take 0.5 tablets (2.5 mg total) by mouth 2 (two) times daily before a meal. Please take for 2 days, then stop once or you resumed her Janumet back  . sitaGLIPtin-metformin (JANUMET) 50-1000 MG tablet Take 1 tablet by mouth 2 (two) times daily with a meal. Please hold for next 2 days as you have received IV contrast during hospital stay, resume on 04/17/2016   No  current facility-administered medications for this visit. (Other)      REVIEW OF SYSTEMS:    ALLERGIES No Known Allergies  PAST MEDICAL HISTORY Past Medical History:  Diagnosis Date  . Cataracts, bilateral   . Diabetes ([place])   . Retinal detachment left eye, 08/2013  . Skin cancer   . Stroke ([place])   . Stroke due to embolism ([place]) 01/14/2016   Past Surgical History:  Procedure Laterality Date  . COLONOSCOPY  2002  . EYE SURGERY    . PARS PLANA VITRECTOMY Left 02/18/2020   Procedure: PARS PLANA VITRECTOMY 25 GAUGE FOR ENDOPHTHALMITIS, ENDOPTHALMITIS PROTOCOL WITH ANTIBIOTIC INJECTION;  Surgeon: [name], MD;  Location: [facility];  Service: Ophthalmology;  Laterality: Left;  . skin cancer removal    . TONSILLECTOMY  childhood    FAMILY HISTORY Family History  Problem Relation Age of Onset  . Clotting disorder Mother        heart attack  . Stroke Maternal Grandfather        stroke  . Clotting disorder Daughter        prothrombin gene mutation  . Colon cancer Neg Hx   . Esophageal cancer Neg Hx   . Rectal cancer Neg Hx   . Stomach cancer Neg Hx     SOCIAL HISTORY Social History  Tobacco Use  . Smoking status: Never Smoker  . Smokeless tobacco: Never Used  Substance Use Topics  . Alcohol use: Yes    Comment: rare  . Drug use: No         OPHTHALMIC EXAM:  Base Eye Exam    Visual Acuity (Snellen - Linear)      Right Left   Dist [place] 20/20-1 CF @ 4'   Dist ph [place]  NI       Tonometry (Tonopen, 3:24 PM)      Right Left   Pressure 13 6       Neuro/Psych    Oriented x3: Yes   Mood/Affect: Normal       Dilation    Left eye: 1.0% Mydriacyl, 2.5% Phenylephrine @ 3:24 PM        Slit Lamp and Fundus Exam    External Exam      Right Left   External Normal Normal       Slit Lamp Exam      Right Left   Lids/Lashes Normal Normal   Conjunctiva/Sclera White and quiet White and quiet   Cornea Clear Clear   Anterior Chamber Deep and quiet Deep and  quiet   Iris Round and reactive Round and reactive   Lens Posterior chamber intraocular lens Posterior chamber intraocular lens   Anterior Vitreous Normal Normal       Fundus Exam      Right Left   Posterior Vitreous  2+ Cells, Membranes   Disc  Normal   C/D Ratio  0.65   Macula  Severe clinically significant macular edema   Vessels  NPDR severe   Periphery  Normal          IMAGING AND PROCEDURES  Imaging and Procedures for 03/01/20  Color Fundus Photography Optos - OU - Both Eyes       Right Eye Progression has improved.   Left Eye Progression has been stable. Macula : edema.   Notes CSME OS improved since last injection, but now clear post clearance of the vitreous debris                ASSESSMENT/PLAN:  Acute endophthalmitis of left eye Staph epidermidis endophthalmitis, OS now resolved and vitreous clearing  Severe nonproliferative diabetic retinopathy of left eye, with macular edema, associated with type 2 diabetes mellitus ([place]) Chronic clinically significant macular edema left eye will observe  Will not plan further injections in the left eye without commencing with a complete lid hygiene regimen to control rosacea blepharitis this would include use of oral doxycycline for some weeks prior to any possible injection      ICD-10-CM   1. Severe nonproliferative diabetic retinopathy of right eye, with macular edema, associated with type 2 diabetes mellitus (HCC)  V40.0867 Color Fundus Photography Optos - OU - Both Eyes  2. Severe nonproliferative diabetic retinopathy of left eye, with macular edema, associated with type 2 diabetes mellitus (HCC)  Y19.5093 Color Fundus Photography Optos - OU - Both Eyes  3. Acute endophthalmitis of left eye  H44.002     1.  Continue Pred [place] OS 3 x daily for 3 more weeks, then discontinue.  2.  3.  Ophthalmic Meds Ordered this visit:  No orders of the defined types were placed in this encounter.      No  follow-ups on file.  There are no Patient Instructions on file for this visit.   Explained the diagnoses, plan, and follow up  with the patient and they expressed understanding.  Patient expressed understanding of the importance of proper follow up care.   [name] Breckin Savannah M.D. Diseases & Surgery of the Retina and Vitreous Retina & Diabetic [place] 03/01/20     Abbreviations: M myopia (nearsighted); A astigmatism; H hyperopia (farsighted); P presbyopia; Mrx spectacle prescription;  CTL contact lenses; OD right eye; OS left eye; OU both eyes  XT exotropia; ET esotropia; PEK punctate epithelial keratitis; PEE punctate epithelial erosions; DES dry eye syndrome; MGD meibomian gland dysfunction; ATs artificial tears; PFAT's preservative free artificial tears; [place] nuclear sclerotic cataract; PSC posterior subcapsular cataract; ERM epi-retinal membrane; PVD posterior vitreous detachment; RD retinal detachment; DM diabetes mellitus; DR diabetic retinopathy; NPDR non-proliferative diabetic retinopathy; PDR proliferative diabetic retinopathy; CSME clinically significant macular edema; DME diabetic macular edema; dbh dot blot hemorrhages; CWS cotton wool spot; POAG primary open angle glaucoma; C/D cup-to-disc ratio; HVF humphrey visual field; GVF goldmann visual field; OCT optical coherence tomography; IOP intraocular pressure; BRVO Branch retinal vein occlusion; CRVO central retinal vein occlusion; CRAO central retinal artery occlusion; BRAO branch retinal artery occlusion; RT retinal tear; SB scleral buckle; PPV pars plana vitrectomy; VH Vitreous hemorrhage; PRP panretinal laser photocoagulation; IVK intravitreal kenalog; VMT vitreomacular traction; MH Macular hole;  NVD neovascularization of the disc; NVE neovascularization elsewhere; AREDS age related eye disease study; ARMD age related macular degeneration; POAG primary open angle glaucoma; EBMD epithelial/anterior basement membrane dystrophy; ACIOL  anterior chamber intraocular lens; IOL intraocular lens; PCIOL posterior chamber intraocular lens; Phaco/IOL phacoemulsification with intraocular lens placement; [place] photorefractive keratectomy; LASIK laser assisted in situ keratomileusis; HTN hypertension; DM diabetes mellitus; COPD chronic obstructive pulmonary disease

## 2020-03-01 NOTE — Assessment & Plan Note (Signed)
Staph epidermidis endophthalmitis, OS now resolved and vitreous clearing

## 2020-03-01 NOTE — Assessment & Plan Note (Signed)
Chronic clinically significant macular edema left eye will observe  Will not plan further injections in the left eye without commencing with a complete lid hygiene regimen to control rosacea blepharitis this would include use of oral doxycycline for some weeks prior to any possible injection

## 2020-03-15 DIAGNOSIS — D510 Vitamin B12 deficiency anemia due to intrinsic factor deficiency: Secondary | ICD-10-CM | POA: Diagnosis not present

## 2020-03-15 DIAGNOSIS — E7849 Other hyperlipidemia: Secondary | ICD-10-CM | POA: Diagnosis not present

## 2020-03-15 DIAGNOSIS — Z0000 Encounter for general adult medical examination without abnormal findings: Secondary | ICD-10-CM | POA: Diagnosis not present

## 2020-03-15 DIAGNOSIS — E1159 Type 2 diabetes mellitus with other circulatory complications: Secondary | ICD-10-CM | POA: Diagnosis not present

## 2020-03-15 DIAGNOSIS — Z125 Encounter for screening for malignant neoplasm of prostate: Secondary | ICD-10-CM | POA: Diagnosis not present

## 2020-03-20 DIAGNOSIS — H3500 Unspecified background retinopathy: Secondary | ICD-10-CM | POA: Diagnosis not present

## 2020-03-20 DIAGNOSIS — R82998 Other abnormal findings in urine: Secondary | ICD-10-CM | POA: Diagnosis not present

## 2020-03-20 DIAGNOSIS — Z0000 Encounter for general adult medical examination without abnormal findings: Secondary | ICD-10-CM | POA: Diagnosis not present

## 2020-03-20 DIAGNOSIS — I129 Hypertensive chronic kidney disease with stage 1 through stage 4 chronic kidney disease, or unspecified chronic kidney disease: Secondary | ICD-10-CM | POA: Diagnosis not present

## 2020-03-20 DIAGNOSIS — Z8673 Personal history of transient ischemic attack (TIA), and cerebral infarction without residual deficits: Secondary | ICD-10-CM | POA: Diagnosis not present

## 2020-03-20 DIAGNOSIS — E11319 Type 2 diabetes mellitus with unspecified diabetic retinopathy without macular edema: Secondary | ICD-10-CM | POA: Diagnosis not present

## 2020-03-21 ENCOUNTER — Encounter (INDEPENDENT_AMBULATORY_CARE_PROVIDER_SITE_OTHER): Payer: Self-pay

## 2020-03-21 ENCOUNTER — Encounter (INDEPENDENT_AMBULATORY_CARE_PROVIDER_SITE_OTHER): Payer: Medicare Other | Admitting: Ophthalmology

## 2020-03-22 ENCOUNTER — Encounter (INDEPENDENT_AMBULATORY_CARE_PROVIDER_SITE_OTHER): Payer: Self-pay | Admitting: Ophthalmology

## 2020-03-22 ENCOUNTER — Ambulatory Visit (INDEPENDENT_AMBULATORY_CARE_PROVIDER_SITE_OTHER): Payer: Medicare Other | Admitting: Ophthalmology

## 2020-03-22 ENCOUNTER — Other Ambulatory Visit: Payer: Self-pay

## 2020-03-22 DIAGNOSIS — E113412 Type 2 diabetes mellitus with severe nonproliferative diabetic retinopathy with macular edema, left eye: Secondary | ICD-10-CM | POA: Diagnosis not present

## 2020-03-22 DIAGNOSIS — H44002 Unspecified purulent endophthalmitis, left eye: Secondary | ICD-10-CM

## 2020-03-22 DIAGNOSIS — E113411 Type 2 diabetes mellitus with severe nonproliferative diabetic retinopathy with macular edema, right eye: Secondary | ICD-10-CM

## 2020-03-22 DIAGNOSIS — Z09 Encounter for follow-up examination after completed treatment for conditions other than malignant neoplasm: Secondary | ICD-10-CM

## 2020-03-22 NOTE — Progress Notes (Signed)
03/22/2020     CHIEF COMPLAINT Patient presents for Retina Follow Up   HISTORY OF PRESENT ILLNESS: James Blevins is a 74 y.o. male who presents to the clinic today for:   HPI    Retina Follow Up    Patient presents with  Other.  In left eye.  This started 3 weeks ago.  Duration of 3 weeks.  Since onset it is stable.          Comments    3 week f/u s/p Acute endophthalmitis OS with dilated exam and Fundus photos today.  Pt states the vision has improved some since last visit.  LBS was 166 yesterday.        Last edited by Melburn Popper, COA on 03/22/2020  2:39 PM. (History)      Referring physician: [name], MD [place],   [id number]  HISTORICAL INFORMATION:   Selected notes from the MEDICAL RECORD NUMBER    Lab Results  Component Value Date   HGBA1C 8.4 (H) 04/12/2016     CURRENT MEDICATIONS: Current Outpatient Medications (Ophthalmic Drugs)  Medication Sig  . atropine 1 % ophthalmic solution Place 1 drop into the left eye 2 (two) times daily.  . prednisoLONE acetate (PRED FORTE) 1 % ophthalmic suspension Place 1 drop into the left eye 4 (four) times daily.   No current facility-administered medications for this visit. (Ophthalmic Drugs)   Current Outpatient Medications (Other)  Medication Sig  . aspirin 325 MG tablet Take 1 tablet (325 mg total) by mouth daily.  [name] atorvastatin (LIPITOR) 80 MG tablet Take 80 mg by mouth daily.  . clopidogrel (PLAVIX) 75 MG tablet Take 75 mg by mouth daily.  . cyanocobalamin (,VITAMIN B-12,) 1000 MCG/ML injection Please take once daily for next 7 days, then once weekly for 4 weeks, then once monthly.  [name] glipiZIDE (GLUCOTROL) 5 MG tablet Take 0.5 tablets (2.5 mg total) by mouth 2 (two) times daily before a meal. Please take for 2 days, then stop once or you resumed her Janumet back  . sitaGLIPtin-metformin (JANUMET) 50-1000 MG tablet Take 1 tablet by mouth 2 (two) times daily with a meal. Please  hold for next 2 days as you have received IV contrast during hospital stay, resume on 04/17/2016   No current facility-administered medications for this visit. (Other)      REVIEW OF SYSTEMS:    ALLERGIES No Known Allergies  PAST MEDICAL HISTORY Past Medical History:  Diagnosis Date  . Cataracts, bilateral   . Diabetes ([place])   . Retinal detachment left eye, 08/2013  . Skin cancer   . Stroke ([place])   . Stroke due to embolism ([place]) 01/14/2016   Past Surgical History:  Procedure Laterality Date  . COLONOSCOPY  2002  . EYE SURGERY    . PARS PLANA VITRECTOMY Left 02/18/2020   Procedure: PARS PLANA VITRECTOMY 25 GAUGE FOR ENDOPHTHALMITIS, ENDOPTHALMITIS PROTOCOL WITH ANTIBIOTIC INJECTION;  Surgeon: [name], MD;  Location: [facility];  Service: Ophthalmology;  Laterality: Left;  . skin cancer removal    . TONSILLECTOMY  childhood    FAMILY HISTORY Family History  Problem Relation Age of Onset  . Clotting disorder Mother        heart attack  . Stroke Maternal Grandfather        stroke  . Clotting disorder Daughter        prothrombin gene mutation  . Colon cancer Neg Hx   . Esophageal cancer  Neg Hx   . Rectal cancer Neg Hx   . Stomach cancer Neg Hx     SOCIAL HISTORY Social History   Tobacco Use  . Smoking status: Never Smoker  . Smokeless tobacco: Never Used  Substance Use Topics  . Alcohol use: Yes    Comment: rare  . Drug use: No         OPHTHALMIC EXAM:  Base Eye Exam    Visual Acuity (ETDRS)      Right Left   Dist [place] 20/25 CF at 2'       Tonometry (Tonopen, 2:44 PM)      Right Left   Pressure 12 9       Pupils      Pupils Dark Light Shape React APD   Right PERRL 5 4 Round Slow None   Left PERRL 5 4 Round Slow None       Visual Fields (Counting fingers)      Left Right    Full Full       Extraocular Movement      Right Left    Full Full       Neuro/Psych    Oriented x3: Yes   Mood/Affect: Normal       Dilation    Left eye:  1.0% Mydriacyl, 2.5% Phenylephrine @ 2:44 PM        Slit Lamp and Fundus Exam    External Exam      Right Left   External Normal Normal       Slit Lamp Exam      Right Left   Lids/Lashes Normal Normal   Conjunctiva/Sclera White and quiet White and quiet   Cornea Clear Clear   Anterior Chamber Deep and quiet Deep and quiet   Iris Round and reactive Round and reactive   Lens Centered posterior chamber intraocular lens Centered posterior chamber intraocular lens   Anterior Vitreous Normal Normal       Fundus Exam      Right Left   Posterior Vitreous  Vitrectomized, media clear   Disc  Normal   C/D Ratio  0.7   Macula  Severe clinically significant macular edema   Vessels  NPDR severe   Periphery  Normal          IMAGING AND PROCEDURES  Imaging and Procedures for 03/22/20  Color Fundus Photography Optos - OU - Both Eyes       Right Eye Progression has been stable.   Left Eye Progression has improved. Disc findings include normal observations.   Notes OD with severe NPDR, no active CSME  OS with quiescent PDR, active CSME diffuse, media clear resolving endophthalmitis                ASSESSMENT/PLAN:  Severe nonproliferative diabetic retinopathy of right eye, with macular edema, associated with type 2 diabetes mellitus ([place]) OD, monocular status.  Due to patient and physician choice to avoid any injectable medications either eye with his chronic eyelid disease, we discussed that I will offer panretinal photocoagulation anteriorly in the right eye to protect his monocular status and prevent a severe NPDR progression to PDR.  Acute endophthalmitis of left eye OS, completely resolved      ICD-10-CM   1. Severe nonproliferative diabetic retinopathy of left eye, with macular edema, associated with type 2 diabetes mellitus (HCC)  A83.4196 Color Fundus Photography Optos - OU - Both Eyes  2. Severe nonproliferative diabetic retinopathy of right eye, with  macular edema, associated with type 2 diabetes mellitus (HCC)  F74.9449 Color Fundus Photography Optos - OU - Both Eyes  3. Follow-up examination after eye surgery  Z09   4. Acute endophthalmitis of left eye  H44.002     1.  OU dilate OU next visit  2.  Likely PRP OD next visit  3.  OS will observe  Ophthalmic Meds Ordered this visit:  No orders of the defined types were placed in this encounter.      Return in about 3 months (around 06/22/2020) for DILATE OU, PRP, OD.  There are no Patient Instructions on file for this visit.   Explained the diagnoses, plan, and follow up with the patient and they expressed understanding.  Patient expressed understanding of the importance of proper follow up care.   [name] Fatoumata Albaugh M.D. Diseases & Surgery of the Retina and Vitreous Retina & Diabetic [place] 03/22/20     Abbreviations: M myopia (nearsighted); A astigmatism; H hyperopia (farsighted); P presbyopia; Mrx spectacle prescription;  CTL contact lenses; OD right eye; OS left eye; OU both eyes  XT exotropia; ET esotropia; PEK punctate epithelial keratitis; PEE punctate epithelial erosions; DES dry eye syndrome; MGD meibomian gland dysfunction; ATs artificial tears; PFAT's preservative free artificial tears; [place] nuclear sclerotic cataract; PSC posterior subcapsular cataract; ERM epi-retinal membrane; PVD posterior vitreous detachment; RD retinal detachment; DM diabetes mellitus; DR diabetic retinopathy; NPDR non-proliferative diabetic retinopathy; PDR proliferative diabetic retinopathy; CSME clinically significant macular edema; DME diabetic macular edema; dbh dot blot hemorrhages; CWS cotton wool spot; POAG primary open angle glaucoma; C/D cup-to-disc ratio; HVF humphrey visual field; GVF goldmann visual field; OCT optical coherence tomography; IOP intraocular pressure; BRVO Branch retinal vein occlusion; CRVO central retinal vein occlusion; CRAO central retinal artery occlusion; BRAO branch  retinal artery occlusion; RT retinal tear; SB scleral buckle; PPV pars plana vitrectomy; VH Vitreous hemorrhage; PRP panretinal laser photocoagulation; IVK intravitreal kenalog; VMT vitreomacular traction; MH Macular hole;  NVD neovascularization of the disc; NVE neovascularization elsewhere; AREDS age related eye disease study; ARMD age related macular degeneration; POAG primary open angle glaucoma; EBMD epithelial/anterior basement membrane dystrophy; ACIOL anterior chamber intraocular lens; IOL intraocular lens; PCIOL posterior chamber intraocular lens; Phaco/IOL phacoemulsification with intraocular lens placement; [place] photorefractive keratectomy; LASIK laser assisted in situ keratomileusis; HTN hypertension; DM diabetes mellitus; COPD chronic obstructive pulmonary disease

## 2020-03-22 NOTE — Assessment & Plan Note (Signed)
OD, monocular status.  Due to patient and physician choice to avoid any injectable medications either eye with his chronic eyelid disease, we discussed that I will offer panretinal photocoagulation anteriorly in the right eye to protect his monocular status and prevent a severe NPDR progression to PDR.

## 2020-03-22 NOTE — Assessment & Plan Note (Signed)
OS, completely resolved

## 2020-03-27 DIAGNOSIS — D2361 Other benign neoplasm of skin of right upper limb, including shoulder: Secondary | ICD-10-CM | POA: Diagnosis not present

## 2020-03-27 DIAGNOSIS — Z85820 Personal history of malignant melanoma of skin: Secondary | ICD-10-CM | POA: Diagnosis not present

## 2020-03-27 DIAGNOSIS — L570 Actinic keratosis: Secondary | ICD-10-CM | POA: Diagnosis not present

## 2020-03-27 DIAGNOSIS — Z85828 Personal history of other malignant neoplasm of skin: Secondary | ICD-10-CM | POA: Diagnosis not present

## 2020-06-25 ENCOUNTER — Other Ambulatory Visit: Payer: Self-pay

## 2020-06-25 ENCOUNTER — Encounter (INDEPENDENT_AMBULATORY_CARE_PROVIDER_SITE_OTHER): Payer: Self-pay | Admitting: Ophthalmology

## 2020-06-25 ENCOUNTER — Ambulatory Visit (INDEPENDENT_AMBULATORY_CARE_PROVIDER_SITE_OTHER): Payer: Medicare Other | Admitting: Ophthalmology

## 2020-06-25 DIAGNOSIS — E113412 Type 2 diabetes mellitus with severe nonproliferative diabetic retinopathy with macular edema, left eye: Secondary | ICD-10-CM

## 2020-06-25 DIAGNOSIS — E113411 Type 2 diabetes mellitus with severe nonproliferative diabetic retinopathy with macular edema, right eye: Secondary | ICD-10-CM

## 2020-06-25 DIAGNOSIS — H34232 Retinal artery branch occlusion, left eye: Secondary | ICD-10-CM | POA: Diagnosis not present

## 2020-06-25 DIAGNOSIS — H34212 Partial retinal artery occlusion, left eye: Secondary | ICD-10-CM

## 2020-06-25 NOTE — Patient Instructions (Signed)
Instructed to contact Dr.Avva  For evaluation of cerebral vasculature and carotid arteries.

## 2020-06-25 NOTE — Assessment & Plan Note (Signed)
Chronic OS and improved over time, status post peripheral PRP.  Patient Declines further injections due to prior stroke, 4 years previous

## 2020-06-25 NOTE — Progress Notes (Signed)
06/25/2020     CHIEF COMPLAINT Patient presents for Retina Follow Up   HISTORY OF PRESENT ILLNESS: James Blevins is a 74 y.o. male who presents to the clinic today for:   HPI    Retina Follow Up    Patient presents with  Diabetic Retinopathy.  In right eye.  This started 3 months ago.  Severity is mild.  Duration of 3 months.  Since onset it is stable.          Comments    3 Month Diabetic F/U OU, PRP OD  Pt denies noticeable changes to [place] OU since last visit. Pt denies ocular pain, flashes of light, or floaters OU.  LBS: 140 this AM A1c: has not checked        Last edited by Rockie Neighbours, [place] on 06/25/2020  2:04 PM. (History)      Referring physician: [name], MD [place],  [place] [address]  HISTORICAL INFORMATION:   Selected notes from the MEDICAL RECORD NUMBER    Lab Results  Component Value Date   HGBA1C 8.4 (H) 04/12/2016     CURRENT MEDICATIONS: Current Outpatient Medications (Ophthalmic Drugs)  Medication Sig  . atropine 1 % ophthalmic solution Place 1 drop into the left eye 2 (two) times daily. (Patient not taking: Reported on 06/25/2020)  . prednisoLONE acetate (PRED FORTE) 1 % ophthalmic suspension Place 1 drop into the left eye 4 (four) times daily. (Patient not taking: Reported on 06/25/2020)   No current facility-administered medications for this visit. (Ophthalmic Drugs)   Current Outpatient Medications (Other)  Medication Sig  . aspirin 325 MG tablet Take 1 tablet (325 mg total) by mouth daily.  [name] atorvastatin (LIPITOR) 80 MG tablet Take 80 mg by mouth daily.  . clopidogrel (PLAVIX) 75 MG tablet Take 75 mg by mouth daily.  . cyanocobalamin (,VITAMIN B-12,) 1000 MCG/ML injection Please take once daily for next 7 days, then once weekly for 4 weeks, then once monthly.  [name] glipiZIDE (GLUCOTROL) 5 MG tablet Take 0.5 tablets (2.5 mg total) by mouth 2 (two) times daily before a meal. Please take for 2 days, then stop once  or you resumed her Janumet back  . sitaGLIPtin-metformin (JANUMET) 50-1000 MG tablet Take 1 tablet by mouth 2 (two) times daily with a meal. Please hold for next 2 days as you have received IV contrast during hospital stay, resume on 04/17/2016   No current facility-administered medications for this visit. (Other)      REVIEW OF SYSTEMS:    ALLERGIES No Known Allergies  PAST MEDICAL HISTORY Past Medical History:  Diagnosis Date  . Acute endophthalmitis of left eye 02/18/2020  . Cataracts, bilateral   . Diabetes ([place])   . Hypopyon of left eye 02/18/2020  . Retinal detachment left eye, 08/2013  . Skin cancer   . Stroke ([place])   . Stroke due to embolism ([place]) 01/14/2016   Past Surgical History:  Procedure Laterality Date  . COLONOSCOPY  2002  . EYE SURGERY    . PARS PLANA VITRECTOMY Left 02/18/2020   Procedure: PARS PLANA VITRECTOMY 25 GAUGE FOR ENDOPHTHALMITIS, ENDOPTHALMITIS PROTOCOL WITH ANTIBIOTIC INJECTION;  Surgeon: [name], MD;  Location: [facility];  Service: Ophthalmology;  Laterality: Left;  . skin cancer removal    . TONSILLECTOMY  childhood    FAMILY HISTORY Family History  Problem Relation Age of Onset  . Clotting disorder Mother        heart attack  .  Stroke Maternal Grandfather        stroke  . Clotting disorder Daughter        prothrombin gene mutation  . Colon cancer Neg Hx   . Esophageal cancer Neg Hx   . Rectal cancer Neg Hx   . Stomach cancer Neg Hx     SOCIAL HISTORY Social History   Tobacco Use  . Smoking status: Never Smoker  . Smokeless tobacco: Never Used  Substance Use Topics  . Alcohol use: Yes    Comment: rare  . Drug use: No         OPHTHALMIC EXAM:  Base Eye Exam    Visual Acuity (ETDRS)      Right Left   Dist [place] 20/30 +2 CF at 3'   Dist ph [place] NI NI       Tonometry (Tonopen, 2:04 PM)      Right Left   Pressure 14 10       Pupils      Pupils Dark Light Shape React APD   Right PERRL 5 4 Round Slow None   Left  PERRL 5 4 Round Slow None       Visual Fields (Counting fingers)      Left Right    Full Full       Extraocular Movement      Right Left    Full Full       Neuro/Psych    Oriented x3: Yes   Mood/Affect: Normal       Dilation    Both eyes: 1.0% Mydriacyl, 2.5% Phenylephrine @ 2:08 PM        Slit Lamp and Fundus Exam    External Exam      Right Left   External Normal Normal       Slit Lamp Exam      Right Left   Lids/Lashes Normal Normal   Conjunctiva/Sclera White and quiet White and quiet   Cornea Clear Clear   Anterior Chamber Deep and quiet Deep and quiet   Iris Round and reactive Round and reactive   Lens Centered posterior chamber intraocular lens Centered posterior chamber intraocular lens   Anterior Vitreous Normal Normal       Fundus Exam      Right Left   Posterior Vitreous Normal Vitrectomized, media clear   Disc Normal Normal   C/D Ratio 0.5 0.7   Macula Microaneurysms, Mild clinically significant macular edema Severe clinically significant macular edema   Vessels NPDR-Severe NPDR severe, new  intraretinal Hollenhorst calcific plaque superior to the nerve visible   Periphery Normal Normal          IMAGING AND PROCEDURES  Imaging and Procedures for 06/25/20  OCT, Retina - OU - Both Eyes       Right Eye Quality was good. Scan locations included subfoveal. Central Foveal Thickness: 361. Progression has been stable. Findings include normal observations.   Left Eye Scan locations included subfoveal. Central Foveal Thickness: 552. Progression has been stable. Findings include cystoid macular edema, abnormal foveal contour.   Notes OS CSME massive, continued slow improvement.  Laser treatment CSME OD temporal aspect of the fovea slightly worse today.  May consider focal laser temporally in order to prevent CSME progression        Color Fundus Photography Optos - OU - Both Eyes       Right Eye Progression has been stable. Disc findings  include hemorrhage. Macula : microaneurysms. Vessels : IRMA. Periphery :  normal observations.   Left Eye Progression has been stable. Macula : microaneurysms, exudates. Vessels : IRMA.   Notes New intraretinal calcific Hollenhorst plaque superior and plaque inferior to the optic nerve left eye.  Findings are new over the last 6 months.  Severe NPDR with minor CSME OD.  OS with chronic CSME.                ASSESSMENT/PLAN:  Branch retinal artery occlusion, left New Hollenhorst plaque superior to the optic nerve visible on examination  Will recommend follow-up with vascular surgery and her PCP to evaluate carotid arteries  Severe nonproliferative diabetic retinopathy of left eye, with macular edema, associated with type 2 diabetes mellitus (HCC) Chronic OS and improved over time, status post peripheral PRP.  Patient Declines further injections due to prior stroke, 4 years previous      ICD-10-CM   1. Hollenhorst plaque, left eye  H34.212 Color Fundus Photography Optos - OU - Both Eyes  2. Severe nonproliferative diabetic retinopathy of right eye, with macular edema, associated with type 2 diabetes mellitus (HCC)  E11.3411 OCT, Retina - OU - Both Eyes  3. Severe nonproliferative diabetic retinopathy of left eye, with macular edema, associated with type 2 diabetes mellitus (HCC)  U76.5465 OCT, Retina - OU - Both Eyes  4. Branch retinal artery occlusion, left  H34.232 Color Fundus Photography Optos - OU - Both Eyes    1.  Severe CSME, macular edema left eye slowly improving  2.  New calcific Hollenhorst plaque, branch retinal artery occlusion left eye asymptomatic.  New finding in a patient with a history of stroke, requires reevaluation of proximal vasculature to the eyes and the cerebral vasculature.  I instructed the patient to contact out of his office to commence with the evaluation.  3.  New mild clinically significant macular edema right eye (CSME) will need evaluation  likely focal laser photocoagulation on a nonemergent basis in the coming weeks.  Ophthalmic Meds Ordered this visit:  No orders of the defined types were placed in this encounter.      Return in about 4 weeks (around 07/23/2020) for DILATE OU, COLOR FP, FOCAL, OD.  Patient Instructions  Instructed to contact Dr.Avva  For evaluation of cerebral vasculature and carotid arteries.    Explained the diagnoses, plan, and follow up with the patient and they expressed understanding.  Patient expressed understanding of the importance of proper follow up care.   [name] Teyton Pattillo M.D. Diseases & Surgery of the Retina and Vitreous [facility] 06/25/20     Abbreviations: M myopia (nearsighted); A astigmatism; H hyperopia (farsighted); P presbyopia; Mrx spectacle prescription;  CTL contact lenses; OD right eye; OS left eye; OU both eyes  XT exotropia; ET esotropia; PEK punctate epithelial keratitis; PEE punctate epithelial erosions; DES dry eye syndrome; MGD meibomian gland dysfunction; ATs artificial tears; PFAT's preservative free artificial tears; [place] nuclear sclerotic cataract; PSC posterior subcapsular cataract; ERM epi-retinal membrane; PVD posterior vitreous detachment; RD retinal detachment; DM diabetes mellitus; DR diabetic retinopathy; NPDR non-proliferative diabetic retinopathy; PDR proliferative diabetic retinopathy; CSME clinically significant macular edema; DME diabetic macular edema; dbh dot blot hemorrhages; CWS cotton wool spot; POAG primary open angle glaucoma; C/D cup-to-disc ratio; HVF humphrey visual field; GVF goldmann visual field; OCT optical coherence tomography; IOP intraocular pressure; BRVO Branch retinal vein occlusion; CRVO central retinal vein occlusion; CRAO central retinal artery occlusion; BRAO branch retinal artery occlusion; RT retinal tear; SB scleral buckle; PPV pars plana vitrectomy;  VH Vitreous hemorrhage; PRP panretinal laser photocoagulation; IVK  intravitreal kenalog; VMT vitreomacular traction; MH Macular hole;  NVD neovascularization of the disc; NVE neovascularization elsewhere; AREDS age related eye disease study; ARMD age related macular degeneration; POAG primary open angle glaucoma; EBMD epithelial/anterior basement membrane dystrophy; ACIOL anterior chamber intraocular lens; IOL intraocular lens; PCIOL posterior chamber intraocular lens; Phaco/IOL phacoemulsification with intraocular lens placement; [place] photorefractive keratectomy; LASIK laser assisted in situ keratomileusis; HTN hypertension; DM diabetes mellitus; COPD chronic obstructive pulmonary disease

## 2020-06-25 NOTE — Assessment & Plan Note (Addendum)
New Hollenhorst plaque superior to the optic nerve visible on examination  Will recommend follow-up with vascular surgery and her PCP to evaluate carotid arteries

## 2020-06-26 DIAGNOSIS — E785 Hyperlipidemia, unspecified: Secondary | ICD-10-CM | POA: Diagnosis not present

## 2020-06-26 DIAGNOSIS — I129 Hypertensive chronic kidney disease with stage 1 through stage 4 chronic kidney disease, or unspecified chronic kidney disease: Secondary | ICD-10-CM | POA: Diagnosis not present

## 2020-06-26 DIAGNOSIS — H3500 Unspecified background retinopathy: Secondary | ICD-10-CM | POA: Diagnosis not present

## 2020-06-26 DIAGNOSIS — E1159 Type 2 diabetes mellitus with other circulatory complications: Secondary | ICD-10-CM | POA: Diagnosis not present

## 2020-06-29 ENCOUNTER — Other Ambulatory Visit: Payer: Self-pay | Admitting: Internal Medicine

## 2020-06-29 ENCOUNTER — Other Ambulatory Visit (HOSPITAL_COMMUNITY): Payer: Self-pay | Admitting: Internal Medicine

## 2020-06-29 DIAGNOSIS — I639 Cerebral infarction, unspecified: Secondary | ICD-10-CM

## 2020-06-30 ENCOUNTER — Ambulatory Visit (HOSPITAL_COMMUNITY)
Admission: RE | Admit: 2020-06-30 | Discharge: 2020-06-30 | Disposition: A | Discharge status: Home / Self Care | Payer: Medicare Other | Source: Ambulatory Visit | Attending: Internal Medicine | Admitting: Internal Medicine

## 2020-06-30 ENCOUNTER — Other Ambulatory Visit: Payer: Self-pay

## 2020-06-30 DIAGNOSIS — I639 Cerebral infarction, unspecified: Secondary | ICD-10-CM | POA: Insufficient documentation

## 2020-06-30 DIAGNOSIS — Z0389 Encounter for observation for other suspected diseases and conditions ruled out: Secondary | ICD-10-CM | POA: Diagnosis not present

## 2020-06-30 MED ORDER — GADOBUTROL 1 MMOL/ML IV SOLN
8.0000 mL | Freq: Once | INTRAVENOUS | Status: AC | PRN
  Administered 2020-06-30: 8 mL via INTRAVENOUS

## 2020-07-19 DIAGNOSIS — H3500 Unspecified background retinopathy: Secondary | ICD-10-CM | POA: Diagnosis not present

## 2020-07-19 DIAGNOSIS — E1159 Type 2 diabetes mellitus with other circulatory complications: Secondary | ICD-10-CM | POA: Diagnosis not present

## 2020-07-19 DIAGNOSIS — E785 Hyperlipidemia, unspecified: Secondary | ICD-10-CM | POA: Diagnosis not present

## 2020-07-19 DIAGNOSIS — I129 Hypertensive chronic kidney disease with stage 1 through stage 4 chronic kidney disease, or unspecified chronic kidney disease: Secondary | ICD-10-CM | POA: Diagnosis not present

## 2020-07-24 ENCOUNTER — Ambulatory Visit (INDEPENDENT_AMBULATORY_CARE_PROVIDER_SITE_OTHER): Payer: Medicare Other | Admitting: Ophthalmology

## 2020-07-24 ENCOUNTER — Encounter (INDEPENDENT_AMBULATORY_CARE_PROVIDER_SITE_OTHER): Payer: Self-pay | Admitting: Ophthalmology

## 2020-07-24 ENCOUNTER — Other Ambulatory Visit: Payer: Self-pay

## 2020-07-24 DIAGNOSIS — E113412 Type 2 diabetes mellitus with severe nonproliferative diabetic retinopathy with macular edema, left eye: Secondary | ICD-10-CM

## 2020-07-24 DIAGNOSIS — E113411 Type 2 diabetes mellitus with severe nonproliferative diabetic retinopathy with macular edema, right eye: Secondary | ICD-10-CM

## 2020-07-24 NOTE — Progress Notes (Signed)
07/24/2020     CHIEF COMPLAINT Patient presents for Retina Follow Up   HISTORY OF PRESENT ILLNESS: James Blevins is a 74 y.o. male who presents to the clinic today for:   HPI    Retina Follow Up    Patient presents with  Diabetic Retinopathy.  In both eyes.  This started 4 weeks ago.  Severity is mild.  Duration of 4 weeks.  Since onset it is stable.          Comments    4 Week Diabetic F/U OU  Pt sts he keeps seeing a "film" over [place]. Pt denies ocular pain, flashes of light, or floaters OU.  LBS: 169 yesterday        Last edited by Rockie Neighbours, [place] on 07/24/2020  2:25 PM. (History)      Referring physician: [name], MD [place],  [place] [address]  HISTORICAL INFORMATION:   Selected notes from the MEDICAL RECORD NUMBER    Lab Results  Component Value Date   HGBA1C 8.4 (H) 04/12/2016     CURRENT MEDICATIONS: Current Outpatient Medications (Ophthalmic Drugs)  Medication Sig  . atropine 1 % ophthalmic solution Place 1 drop into the left eye 2 (two) times daily. (Patient not taking: Reported on 06/25/2020)  . prednisoLONE acetate (PRED FORTE) 1 % ophthalmic suspension Place 1 drop into the left eye 4 (four) times daily. (Patient not taking: Reported on 06/25/2020)   No current facility-administered medications for this visit. (Ophthalmic Drugs)   Current Outpatient Medications (Other)  Medication Sig  . aspirin 325 MG tablet Take 1 tablet (325 mg total) by mouth daily.  [name] atorvastatin (LIPITOR) 80 MG tablet Take 80 mg by mouth daily.  . clopidogrel (PLAVIX) 75 MG tablet Take 75 mg by mouth daily.  . cyanocobalamin (,VITAMIN B-12,) 1000 MCG/ML injection Please take once daily for next 7 days, then once weekly for 4 weeks, then once monthly.  [name] glipiZIDE (GLUCOTROL) 5 MG tablet Take 0.5 tablets (2.5 mg total) by mouth 2 (two) times daily before a meal. Please take for 2 days, then stop once or you resumed her Janumet back  .  sitaGLIPtin-metformin (JANUMET) 50-1000 MG tablet Take 1 tablet by mouth 2 (two) times daily with a meal. Please hold for next 2 days as you have received IV contrast during hospital stay, resume on 04/17/2016   No current facility-administered medications for this visit. (Other)      REVIEW OF SYSTEMS:    ALLERGIES No Known Allergies  PAST MEDICAL HISTORY Past Medical History:  Diagnosis Date  . Acute endophthalmitis of left eye 02/18/2020  . Cataracts, bilateral   . Diabetes ([place])   . Hypopyon of left eye 02/18/2020  . Retinal detachment left eye, 08/2013  . Skin cancer   . Stroke ([place])   . Stroke due to embolism ([place]) 01/14/2016   Past Surgical History:  Procedure Laterality Date  . COLONOSCOPY  2002  . EYE SURGERY    . PARS PLANA VITRECTOMY Left 02/18/2020   Procedure: PARS PLANA VITRECTOMY 25 GAUGE FOR ENDOPHTHALMITIS, ENDOPTHALMITIS PROTOCOL WITH ANTIBIOTIC INJECTION;  Surgeon: [name], MD;  Location: [facility];  Service: Ophthalmology;  Laterality: Left;  . skin cancer removal    . TONSILLECTOMY  childhood    FAMILY HISTORY Family History  Problem Relation Age of Onset  . Clotting disorder Mother        heart attack  . Stroke Maternal Grandfather  stroke  . Clotting disorder Daughter        prothrombin gene mutation  . Colon cancer Neg Hx   . Esophageal cancer Neg Hx   . Rectal cancer Neg Hx   . Stomach cancer Neg Hx     SOCIAL HISTORY Social History   Tobacco Use  . Smoking status: Never Smoker  . Smokeless tobacco: Never Used  Substance Use Topics  . Alcohol use: Yes    Comment: rare  . Drug use: No         OPHTHALMIC EXAM: Base Eye Exam    Visual Acuity (ETDRS)      Right Left   Dist [place] 20/50 +2 CF @ 5'   Dist ph [place] 20/30 NI       Tonometry (Tonopen, 2:25 PM)      Right Left   Pressure 13 09       Pupils      Pupils Dark Light Shape React APD   Right PERRL 5 4 Round Slow None   Left PERRL 5 4 Round Slow None        Visual Fields (Counting fingers)      Left Right    Full Full       Extraocular Movement      Right Left    Full Full       Neuro/Psych    Oriented x3: Yes   Mood/Affect: Normal       Dilation    Both eyes: 1.0% Mydriacyl, 2.5% Phenylephrine @ 2:28 PM        Slit Lamp and Fundus Exam    External Exam      Right Left   External Normal Normal       Slit Lamp Exam      Right Left   Lids/Lashes Normal Normal   Conjunctiva/Sclera White and quiet White and quiet   Cornea Clear Clear   Anterior Chamber Deep and quiet Deep and quiet   Iris Round and reactive Round and reactive   Lens Centered posterior chamber intraocular lens Centered posterior chamber intraocular lens   Anterior Vitreous Normal Normal       Fundus Exam      Right Left   Posterior Vitreous Normal Vitrectomized, media clear   Disc Normal Normal   C/D Ratio 0.5 0.7   Macula Microaneurysms, Mild clinically significant macular edema Severe clinically significant macular edema   Vessels NPDR-Severe NPDR severe, new  intraretinal Hollenhorst calcific plaque superior to the nerve visible   Periphery Normal Normal          IMAGING AND PROCEDURES  Imaging and Procedures for 07/24/20  Color Fundus Photography Optos - OU - Both Eyes       Right Eye Progression has been stable. Disc findings include normal observations. Macula : edema. Vessels : IRMA.   Left Eye Progression has been stable. Macula : edema, microaneurysms. Vessels : IRMA.   Notes CSME OD temporally, for focal laser treatment today       Focal Laser - OD - Right Eye       Time Out Confirmed correct patient, procedure, site, and patient consented.   Anesthesia Topical anesthesia was used. Anesthetic medications included Proparacaine 0.5%.   Laser Information The type of laser was diode. Color was yellow. The duration in seconds was 0.08. The spot size was 100 microns. Laser power was 80. Total spots was 344.   Post-op The  patient tolerated the procedure well. There were  no complications. The patient received written and verbal post procedure care education.   Notes Focal) applied temporal to the macula.  OCT and color fundus photography guided                ASSESSMENT/PLAN:  No problem-specific Assessment & Plan notes found for this encounter.      ICD-10-CM   1. Severe nonproliferative diabetic retinopathy of right eye, with macular edema, associated with type 2 diabetes mellitus (HCC)  A56.9794 Color Fundus Photography Optos - OU - Both Eyes    Focal Laser - OD - Right Eye    CANCELED: OCT, Retina - OU - Both Eyes  2. Severe nonproliferative diabetic retinopathy of left eye, with macular edema, associated with type 2 diabetes mellitus (HCC)  I01.6553 Color Fundus Photography Optos - OU - Both Eyes    CANCELED: OCT, Retina - OU - Both Eyes    1.  Diabetic CSME OD, mostly temporal to the fovea, will repeat grid and focal laser treatment to this region today to prevent progression to center involvement.  2.  OS with multifocal diffuse CSME centered prior antivegF  3.  Ophthalmic Meds Ordered this visit:  No orders of the defined types were placed in this encounter.      Return in about 4 months (around 11/21/2020) for DILATE OU, COLOR FP, OCT.  There are no Patient Instructions on file for this visit.   Explained the diagnoses, plan, and follow up with the patient and they expressed understanding.  Patient expressed understanding of the importance of proper follow up care.   [name] Shahrzad Koble M.D. Diseases & Surgery of the Retina and Vitreous [facility]ina [facility] Di[facility] 07/24/20     Abbreviations: M myopia (nearsighted); A astigmatism; H hyperopia (farsighted); P presbyopia; Mrx spectacle prescription;  CTL contact lenses; OD right eye; OS left eye; OU both eyes  XT exotropia; ET esotropia; PEK punctate epithelial keratitis; PEE punctate epithelial erosions; DES dry eye syndrome; MGD  meibomian gland dysfunction; ATs artificial tears; PFAT's preservative free artificial tears; [place] nuclear sclerotic cataract; PSC posterior subcapsular cataract; ERM epi-retinal membrane; PVD posterior vitreous detachment; RD retinal detachment; DM diabetes mellitus; DR diabetic retinopathy; NPDR non-proliferative diabetic retinopathy; PDR proliferative diabetic retinopathy; CSME clinically significant macular edema; DME diabetic macular edema; dbh dot blot hemorrhages; CWS cotton wool spot; POAG primary open angle glaucoma; C/D cup-to-disc ratio; HVF humphrey visual field; GVF goldmann visual field; OCT optical coherence tomography; IOP intraocular pressure; BRVO Branch retinal vein occlusion; CRVO central retinal vein occlusion; CRAO central retinal artery occlusion; BRAO branch retinal artery occlusion; RT retinal tear; SB scleral buckle; PPV pars plana vitrectomy; VH Vitreous hemorrhage; PRP panretinal laser photocoagulation; IVK intravitreal kenalog; VMT vitreomacular traction; MH Macular hole;  NVD neovascularization of the disc; NVE neovascularization elsewhere; AREDS age related eye disease study; ARMD age related macular degeneration; POAG primary open angle glaucoma; EBMD epithelial/anterior basement membrane dystrophy; ACIOL anterior chamber intraocular lens; IOL intraocular lens; PCIOL posterior chamber intraocular lens; Phaco/IOL phacoemulsification with intraocular lens placement; [place] photorefractive keratectomy; LASIK laser assisted in situ keratomileusis; HTN hypertension; DM diabetes mellitus; COPD chronic obstructive pulmonary disease

## 2020-08-18 DIAGNOSIS — Z20822 Contact with and (suspected) exposure to covid-19: Secondary | ICD-10-CM | POA: Diagnosis not present

## 2020-08-30 DIAGNOSIS — H5203 Hypermetropia, bilateral: Secondary | ICD-10-CM | POA: Diagnosis not present

## 2020-09-12 DIAGNOSIS — S0502XA Injury of conjunctiva and corneal abrasion without foreign body, left eye, initial encounter: Secondary | ICD-10-CM | POA: Diagnosis not present

## 2020-09-12 DIAGNOSIS — H16102 Unspecified superficial keratitis, left eye: Secondary | ICD-10-CM | POA: Diagnosis not present

## 2020-09-12 DIAGNOSIS — H0100A Unspecified blepharitis right eye, upper and lower eyelids: Secondary | ICD-10-CM | POA: Diagnosis not present

## 2020-09-12 DIAGNOSIS — H0100B Unspecified blepharitis left eye, upper and lower eyelids: Secondary | ICD-10-CM | POA: Diagnosis not present

## 2020-09-27 DIAGNOSIS — D225 Melanocytic nevi of trunk: Secondary | ICD-10-CM | POA: Diagnosis not present

## 2020-09-27 DIAGNOSIS — L821 Other seborrheic keratosis: Secondary | ICD-10-CM | POA: Diagnosis not present

## 2020-09-27 DIAGNOSIS — Z85820 Personal history of malignant melanoma of skin: Secondary | ICD-10-CM | POA: Diagnosis not present

## 2020-09-27 DIAGNOSIS — Z85828 Personal history of other malignant neoplasm of skin: Secondary | ICD-10-CM | POA: Diagnosis not present

## 2020-11-20 ENCOUNTER — Ambulatory Visit (INDEPENDENT_AMBULATORY_CARE_PROVIDER_SITE_OTHER): Payer: Medicare Other | Admitting: Ophthalmology

## 2020-11-20 ENCOUNTER — Other Ambulatory Visit: Payer: Self-pay

## 2020-11-20 ENCOUNTER — Encounter (INDEPENDENT_AMBULATORY_CARE_PROVIDER_SITE_OTHER): Payer: Self-pay | Admitting: Ophthalmology

## 2020-11-20 DIAGNOSIS — E113411 Type 2 diabetes mellitus with severe nonproliferative diabetic retinopathy with macular edema, right eye: Secondary | ICD-10-CM | POA: Diagnosis not present

## 2020-11-20 DIAGNOSIS — H34212 Partial retinal artery occlusion, left eye: Secondary | ICD-10-CM | POA: Diagnosis not present

## 2020-11-20 DIAGNOSIS — E113412 Type 2 diabetes mellitus with severe nonproliferative diabetic retinopathy with macular edema, left eye: Secondary | ICD-10-CM

## 2020-11-20 NOTE — Progress Notes (Signed)
11/20/2020     CHIEF COMPLAINT Patient presents for Retina Follow Up (4 Month NPDR f\u OU. OCT and FP/Pt states vision is stable. Pt states no new floaters and FOL)   HISTORY OF PRESENT ILLNESS: James Blevins is a 75 y.o. male who presents to the clinic today for:   HPI    Retina Follow Up    Patient presents with  Diabetic Retinopathy.  In both eyes.  Severity is moderate.  Duration of 4 months.  Since onset it is stable.  I, the attending physician,  performed the HPI with the patient and updated documentation appropriately. Additional comments: 4 Month NPDR f\u OU. OCT and FP Pt states vision is stable. Pt states no new floaters and FOL       Last edited by Tilda Franco on 11/20/2020  3:58 PM. (History)      Referring physician: [name], MD [place],  [place] [address]  HISTORICAL INFORMATION:   Selected notes from the MEDICAL RECORD NUMBER    Lab Results  Component Value Date   HGBA1C 8.4 (H) 04/12/2016     CURRENT MEDICATIONS: Current Outpatient Medications (Ophthalmic Drugs)  Medication Sig  . atropine 1 % ophthalmic solution Place 1 drop into the left eye 2 (two) times daily. (Patient not taking: Reported on 06/25/2020)  . prednisoLONE acetate (PRED FORTE) 1 % ophthalmic suspension Place 1 drop into the left eye 4 (four) times daily. (Patient not taking: Reported on 06/25/2020)   No current facility-administered medications for this visit. (Ophthalmic Drugs)   Current Outpatient Medications (Other)  Medication Sig  . aspirin 325 MG tablet Take 1 tablet (325 mg total) by mouth daily.  [name] atorvastatin (LIPITOR) 80 MG tablet Take 80 mg by mouth daily.  . clopidogrel (PLAVIX) 75 MG tablet Take 75 mg by mouth daily.  . cyanocobalamin (,VITAMIN B-12,) 1000 MCG/ML injection Please take once daily for next 7 days, then once weekly for 4 weeks, then once monthly.  [name] glipiZIDE (GLUCOTROL) 5 MG tablet Take 0.5 tablets (2.5 mg total) by mouth 2  (two) times daily before a meal. Please take for 2 days, then stop once or you resumed her Janumet back  . sitaGLIPtin-metformin (JANUMET) 50-1000 MG tablet Take 1 tablet by mouth 2 (two) times daily with a meal. Please hold for next 2 days as you have received IV contrast during hospital stay, resume on 04/17/2016   No current facility-administered medications for this visit. (Other)      REVIEW OF SYSTEMS:    ALLERGIES No Known Allergies  PAST MEDICAL HISTORY Past Medical History:  Diagnosis Date  . Acute endophthalmitis of left eye 02/18/2020  . Cataracts, bilateral   . Diabetes ([place])   . Hypopyon of left eye 02/18/2020  . Retinal detachment left eye, 08/2013  . Skin cancer   . Stroke ([place])   . Stroke due to embolism ([place]) 01/14/2016   Past Surgical History:  Procedure Laterality Date  . COLONOSCOPY  2002  . EYE SURGERY    . PARS PLANA VITRECTOMY Left 02/18/2020   Procedure: PARS PLANA VITRECTOMY 25 GAUGE FOR ENDOPHTHALMITIS, ENDOPTHALMITIS PROTOCOL WITH ANTIBIOTIC INJECTION;  Surgeon: [name], MD;  Location: [facility];  Service: Ophthalmology;  Laterality: Left;  . skin cancer removal    . TONSILLECTOMY  childhood    FAMILY HISTORY Family History  Problem Relation Age of Onset  . Clotting disorder Mother        heart attack  .  Stroke Maternal Grandfather        stroke  . Clotting disorder Daughter        prothrombin gene mutation  . Colon cancer Neg Hx   . Esophageal cancer Neg Hx   . Rectal cancer Neg Hx   . Stomach cancer Neg Hx     SOCIAL HISTORY Social History   Tobacco Use  . Smoking status: Never Smoker  . Smokeless tobacco: Never Used  Substance Use Topics  . Alcohol use: Yes    Comment: rare  . Drug use: No         OPHTHALMIC EXAM:  Base Eye Exam    Visual Acuity (Snellen - Linear)      Right Left   [place] 20/20 -1 CF @ 4'       Tonometry (Tonopen, 4:03 PM)      Right Left   Pressure 12 10       Pupils      Pupils Dark Light  Shape React APD   Right PERRL 4 3 Round Slow None   Left PERRL 3 3 Round Minimal None       Visual Fields (Counting fingers)      Left Right    Full Full       Neuro/Psych    Oriented x3: Yes   Mood/Affect: Normal       Dilation    Both eyes: 1.0% Mydriacyl, 2.5% Phenylephrine @ 4:03 PM        Slit Lamp and Fundus Exam    External Exam      Right Left   External Normal Normal       Slit Lamp Exam      Right Left   Lids/Lashes Normal Normal   Conjunctiva/Sclera White and quiet White and quiet   Cornea Clear Clear   Anterior Chamber Deep and quiet Deep and quiet   [place]ris Round and reactive Round and reactive   Lens Centered posterior chamber intraocular lens Centered posterior chamber intraocular lens   Anterior Vitreous Normal Normal       Fundus Exam      Right Left   Posterior Vitreous Normal Vitrectomized, media clear   Disc Normal Normal   C/D Ratio 0.55 0.7   Macula Microaneurysms, Mild clinically significant macular edema much less Severe clinically significant macular edema   Vessels NPDR-Severe NPDR severe, new  intraretinal Hollenhorst calcific plaque superior to the nerve visible - stable   Periphery Normal Normal          IMAGING AND PROCEDURES  Imaging and Procedures for 11/20/20  OCT, Retina - OU - Both Eyes       Right Eye Quality was good. Scan locations included subfoveal. Central Foveal Thickness: 312. Progression has improved. Findings include abnormal foveal contour.   Left Eye Quality was good. Scan locations included subfoveal. Central Foveal Thickness: 443. Progression has improved. Findings include abnormal foveal contour.   Notes Much improved CSME OD status post focal grid laser photocoagulation November 2021  OS with diffuse macular edema also improved since last visit will observe                ASSESSMENT/PLAN:  Hollenhorst plaque, left eye Stable      ICD-10-CM   1. Severe nonproliferative diabetic  retinopathy of right eye, with macular edema, associated with type 2 diabetes mellitus ([place])  E11.3411 OCT, Retina - OU - Both Eyes    Color Fundus Photography Optos - OU - Both  Eyes  2. Severe nonproliferative diabetic retinopathy of left eye, with macular edema, associated with type 2 diabetes mellitus ([facility])  L93.7902 OCT, Retina - OU - Both Eyes    Color Fundus Photography Optos - OU - Both Eyes  3. Hollenhorst plaque, left eye  H34.212     1.  Continued improvement in CSME OU we will continue to observe  2.  3.  Ophthalmic Meds Ordered this visit:  No orders of the defined types were placed in this encounter.      Return in about 4 months (around 03/22/2021).  There are no Patient Instructions on file for this visit.   Explained the diagnoses, plan, and follow up with the patient and they expressed understanding.  Patient expressed understanding of the importance of proper follow up care.   [name] Idonia Zollinger M.D. Diseases & Surgery of the Retina and Vitreous [facility] [place] 11/20/20     Abbreviations: M myopia (nearsighted); A astigmatism; H hyperopia (farsighted); P presbyopia; Mrx spectacle prescription;  CTL contact lenses; OD right eye; OS left eye; OU both eyes  XT exotropia; ET esotropia; PEK punctate epithelial keratitis; PEE punctate epithelial erosions; DES dry eye syndrome; MGD meibomian gland dysfunction; ATs artificial tears; PFAT's preservative free artificial tears; [place] nuclear sclerotic cataract; PSC posterior subcapsular cataract; ERM epi-retinal membrane; PVD posterior vitreous detachment; RD retinal detachment; DM diabetes mellitus; DR diabetic retinopathy; NPDR non-proliferative diabetic retinopathy; PDR proliferative diabetic retinopathy; CSME clinically significant macular edema; DME diabetic macular edema; dbh dot blot hemorrhages; CWS cotton wool spot; POAG primary open angle glaucoma; C/D cup-to-disc ratio; HVF humphrey visual field; GVF  goldmann visual field; OCT optical coherence tomography; IOP intraocular pressure; BRVO Branch retinal vein occlusion; CRVO central retinal vein occlusion; CRAO central retinal artery occlusion; BRAO branch retinal artery occlusion; RT retinal tear; SB scleral buckle; PPV pars plana vitrectomy; VH Vitreous hemorrhage; PRP panretinal laser photocoagulation; IVK intravitreal kenalog; VMT vitreomacular traction; MH Macular hole;  NVD neovascularization of the disc; NVE neovascularization elsewhere; AREDS age related eye disease study; ARMD age related macular degeneration; POAG primary open angle glaucoma; EBMD epithelial/anterior basement membrane dystrophy; ACIOL anterior chamber intraocular lens; IOL intraocular lens; PCIOL posterior chamber intraocular lens; Phaco/IOL phacoemulsification with intraocular lens placement; [place] photorefractive keratectomy; LASIK laser assisted in situ keratomileusis; HTN hypertension; DM diabetes mellitus; COPD chronic obstructive pulmonary disease

## 2020-11-20 NOTE — Assessment & Plan Note (Signed)
Stable

## 2020-12-03 DIAGNOSIS — E785 Hyperlipidemia, unspecified: Secondary | ICD-10-CM | POA: Diagnosis not present

## 2020-12-03 DIAGNOSIS — I129 Hypertensive chronic kidney disease with stage 1 through stage 4 chronic kidney disease, or unspecified chronic kidney disease: Secondary | ICD-10-CM | POA: Diagnosis not present

## 2020-12-03 DIAGNOSIS — H3500 Unspecified background retinopathy: Secondary | ICD-10-CM | POA: Diagnosis not present

## 2020-12-03 DIAGNOSIS — E1159 Type 2 diabetes mellitus with other circulatory complications: Secondary | ICD-10-CM | POA: Diagnosis not present

## 2021-01-10 DIAGNOSIS — H16402 Unspecified corneal neovascularization, left eye: Secondary | ICD-10-CM | POA: Diagnosis not present

## 2021-01-10 DIAGNOSIS — H10502 Unspecified blepharoconjunctivitis, left eye: Secondary | ICD-10-CM | POA: Diagnosis not present

## 2021-01-10 DIAGNOSIS — H16202 Unspecified keratoconjunctivitis, left eye: Secondary | ICD-10-CM | POA: Diagnosis not present

## 2021-02-05 ENCOUNTER — Encounter (INDEPENDENT_AMBULATORY_CARE_PROVIDER_SITE_OTHER): Payer: Self-pay

## 2021-02-05 DIAGNOSIS — H4312 Vitreous hemorrhage, left eye: Secondary | ICD-10-CM | POA: Diagnosis not present

## 2021-02-06 ENCOUNTER — Encounter (INDEPENDENT_AMBULATORY_CARE_PROVIDER_SITE_OTHER): Payer: Medicare Other | Admitting: Ophthalmology

## 2021-02-07 ENCOUNTER — Ambulatory Visit (INDEPENDENT_AMBULATORY_CARE_PROVIDER_SITE_OTHER): Payer: Medicare Other | Admitting: Ophthalmology

## 2021-02-07 ENCOUNTER — Other Ambulatory Visit: Payer: Self-pay

## 2021-02-07 ENCOUNTER — Encounter (INDEPENDENT_AMBULATORY_CARE_PROVIDER_SITE_OTHER): Payer: Self-pay | Admitting: Ophthalmology

## 2021-02-07 DIAGNOSIS — E113592 Type 2 diabetes mellitus with proliferative diabetic retinopathy without macular edema, left eye: Secondary | ICD-10-CM

## 2021-02-07 DIAGNOSIS — E113412 Type 2 diabetes mellitus with severe nonproliferative diabetic retinopathy with macular edema, left eye: Secondary | ICD-10-CM | POA: Diagnosis not present

## 2021-02-07 DIAGNOSIS — E113411 Type 2 diabetes mellitus with severe nonproliferative diabetic retinopathy with macular edema, right eye: Secondary | ICD-10-CM | POA: Diagnosis not present

## 2021-02-07 DIAGNOSIS — H4312 Vitreous hemorrhage, left eye: Secondary | ICD-10-CM | POA: Diagnosis not present

## 2021-02-07 NOTE — Assessment & Plan Note (Signed)
Very severe NPDR OD, I have discussed with the patient once the left eye has improved post vitrectomy to remove the dense vitreous hemorrhage, we will we will deliver implant deliver peripheral anterior PRP to permanently quiet down the severe NPDR to prevent progression to PDR and hemorrhage such as occurred now in the left eye

## 2021-02-07 NOTE — Assessment & Plan Note (Addendum)
The nature of the vitreous hemorrhage was discussed with the patient as well as the common causes.   Patients with diabetic eye disease may develop retinal neovascularization.  Other eye conditions develop retinal  neovascularization secondary to retinal venous occlusions.   Vitreous hemorrhage may result from spontaneous vitreous detachment or retinal breaks.  Blunt trauma is a common cause as well.  The need for serial evaluation of the fundus (interior of the eye) until  clear views are obtained was addressed. An occasional need  to monitor the condition by in office  B-scan ultrasonography in the case of dense vitreous hemorrhage was discussed.The nature of the vitreous hemorrhage was discussed with the patient as well as the common causes.   Patients with diabetic eye disease may develop retinal neovascularization.  Other eye conditions develop retinal  neovascularization secondary to retinal venous occlusions.   Vitreous hemorrhage may result from spontaneous vitreous detachment or retinal breaks.  Blunt trauma is a common cause as well.  The need for serial evaluation of the fundus (interior of the eye) until  clear views are obtained was addressed. An occasional need  to monitor the condition by in office  B-scan ultrasonography in the case of dense vitreous hemorrhage was discussed.The nature of the vitreous hemorrhage was discussed with the patient as well as the common causes.   Patients with diabetic eye disease may develop retinal neovascularization.  Other eye conditions develop retinal  neovascularization secondary to retinal venous occlusions.   Vitreous hemorrhage may result from spontaneous vitreous detachment or retinal breaks.  Blunt trauma is a common cause as well.  The need for serial evaluation of the fundus (interior of the eye) until  clear views are obtained was addressed. An occasional need  to monitor the condition by in office  B-scan ultrasonography in the case of dense vitreous  hemorrhage was discussed.  OS with new onset, in eye with prior Hollenhorst plaque branch retinal artery occlusion possible neovascular disease secondary to the underlying condition

## 2021-02-07 NOTE — Assessment & Plan Note (Signed)
Likely progression of severe NPDR to PDR exacerbated by nonperfusion of recent BRVO.  We will need to evaluate B-scan for vitreous hemorrhage to look for etiology and retinal tear detachment

## 2021-02-07 NOTE — Progress Notes (Signed)
02/07/2021     CHIEF COMPLAINT Patient presents for Blurred Vision (WIP - Vit heme OS - Ref'd by Dr. McCuen//Pt c/o blurry VA OS x 2 weeks. Pt sts, "I thought it was blurry from an eye infection but I have bleeding in there" OS. VA OD stable. No ocular pain OU.)   HISTORY OF PRESENT ILLNESS: James Blevins is a 75 y.o. male who presents to the clinic today for:   HPI    Blurred Vision    Laterality: left eye   Onset: unknown   Quality: blurred   Severity: severe   Onset: 2 weeks ago   Frequency: constantly   Timing: throughout the day   Context: distance vision, mid-range vision and near vision   Course: stable   Treatments tried: eye drops   Comments: WIP - Vit heme OS - Ref'd by Dr. Ellie Lunch  Pt c/o blurry VA OS x 2 weeks. Pt sts, "I thought it was blurry from an eye infection but I have bleeding in there" OS. VA OD stable. No ocular pain OU.       Last edited by Rockie Neighbours, [place] on 02/07/2021 10:53 AM. (History)      Referring physician: [name], MD [place],  [place] [address]  HISTORICAL INFORMATION:   Selected notes from the MEDICAL RECORD NUMBER    Lab Results  Component Value Date   HGBA1C 8.4 (H) 04/12/2016     CURRENT MEDICATIONS: Current Outpatient Medications (Ophthalmic Drugs)  Medication Sig  . atropine 1 % ophthalmic solution Place 1 drop into the left eye 2 (two) times daily. (Patient not taking: No sig reported)  . prednisoLONE acetate (PRED FORTE) 1 % ophthalmic suspension Place 1 drop into the left eye 4 (four) times daily. (Patient not taking: No sig reported)   No current facility-administered medications for this visit. (Ophthalmic Drugs)   Current Outpatient Medications (Other)  Medication Sig  . aspirin 325 MG tablet Take 1 tablet (325 mg total) by mouth daily.  [name] atorvastatin (LIPITOR) 80 MG tablet Take 80 mg by mouth daily.  . clopidogrel (PLAVIX) 75 MG tablet Take 75 mg by mouth daily.  . cyanocobalamin  (,VITAMIN B-12,) 1000 MCG/ML injection Please take once daily for next 7 days, then once weekly for 4 weeks, then once monthly.  [name] glipiZIDE (GLUCOTROL) 5 MG tablet Take 0.5 tablets (2.5 mg total) by mouth 2 (two) times daily before a meal. Please take for 2 days, then stop once or you resumed her Janumet back  . sitaGLIPtin-metformin (JANUMET) 50-1000 MG tablet Take 1 tablet by mouth 2 (two) times daily with a meal. Please hold for next 2 days as you have received IV contrast during hospital stay, resume on 04/17/2016   No current facility-administered medications for this visit. (Other)      REVIEW OF SYSTEMS:    ALLERGIES No Known Allergies  PAST MEDICAL HISTORY Past Medical History:  Diagnosis Date  . Acute endophthalmitis of left eye 02/18/2020  . Cataracts, bilateral   . Diabetes ([place])   . Hypopyon of left eye 02/18/2020  . Retinal detachment left eye, 08/2013  . Skin cancer   . Stroke ([place])   . Stroke due to embolism ([place]) 01/14/2016   Past Surgical History:  Procedure Laterality Date  . COLONOSCOPY  2002  . EYE SURGERY    . PARS PLANA VITRECTOMY Left 02/18/2020   Procedure: PARS PLANA VITRECTOMY 25 GAUGE FOR ENDOPHTHALMITIS, ENDOPTHALMITIS PROTOCOL WITH ANTIBIOTIC INJECTION;  Surgeon: [name], MD;  Location: [facility];  Service: Ophthalmology;  Laterality: Left;  . skin cancer removal    . TONSILLECTOMY  childhood    FAMILY HISTORY Family History  Problem Relation Age of Onset  . Clotting disorder Mother        heart attack  . Stroke Maternal Grandfather        stroke  . Clotting disorder Daughter        prothrombin gene mutation  . Colon cancer Neg Hx   . Esophageal cancer Neg Hx   . Rectal cancer Neg Hx   . Stomach cancer Neg Hx     SOCIAL HISTORY Social History   Tobacco Use  . Smoking status: Never Smoker  . Smokeless tobacco: Never Used  Substance Use Topics  . Alcohol use: Yes    Comment: rare  . Drug use: No         OPHTHALMIC  EXAM: Base Eye Exam    Visual Acuity (ETDRS)      Right Left   Dist [place] 20/25 +1 HM   Dist ph [place]  NI       Tonometry (Tonopen, 10:57 AM)      Right Left   Pressure 13 08       Pupils      Dark Light Shape React APD   Right 6 5 Round Slow None   Left 5 4 Round Slow +1       Visual Fields (Counting fingers)      Left Right     Full   Restrictions Total superior temporal, inferior temporal, superior nasal, inferior nasal deficiencies        Extraocular Movement      Right Left    Full Full       Neuro/Psych    Oriented x3: Yes   Mood/Affect: Normal       Dilation    Left eye: 1.0% Mydriacyl, 2.5% Phenylephrine @ 10:57 AM        Slit Lamp and Fundus Exam    External Exam      Right Left   External Normal Normal       Slit Lamp Exam      Right Left   Lids/Lashes Normal Normal   Conjunctiva/Sclera White and quiet White and quiet   Cornea Clear Clear   Anterior Chamber Deep and quiet Deep and quiet   Iris Round and reactive Round and reactive   Lens Centered posterior chamber intraocular lens Centered posterior chamber intraocular lens   Anterior Vitreous Normal Normal       Fundus Exam      Right Left   Posterior Vitreous  Dense Vitreous hemorrhage   Disc  no details   C/D Ratio  no view due to hemorrhage   Macula  no details due to hemorrhage   Vessels  no details   Periphery  no details          IMAGING AND PROCEDURES  Imaging and Procedures for 02/07/21  Color Fundus Photography Optos - OU - Both Eyes       Right Eye Progression has been stable. Macula : edema.   Left Eye Progression has worsened.   Notes Severe NPDR, improved OU  OD with retinopexy superotemporal for operculated retinal tear.  Severe NPDR progression  OD evident  OS no view through dense vitreous hemorrhage       B-Scan Ultrasound - OS - Left Eye  Quality was good. Findings included vitreous hemorrhage.   Notes No retinal tears or detachment, good  scleral buckle remains                ASSESSMENT/PLAN:  Vitreous hemorrhage of left eye (HCC) The nature of the vitreous hemorrhage was discussed with the patient as well as the common causes.   Patients with diabetic eye disease may develop retinal neovascularization.  Other eye conditions develop retinal  neovascularization secondary to retinal venous occlusions.   Vitreous hemorrhage may result from spontaneous vitreous detachment or retinal breaks.  Blunt trauma is a common cause as well.  The need for serial evaluation of the fundus (interior of the eye) until  clear views are obtained was addressed. An occasional need  to monitor the condition by in office  B-scan ultrasonography in the case of dense vitreous hemorrhage was discussed.The nature of the vitreous hemorrhage was discussed with the patient as well as the common causes.   Patients with diabetic eye disease may develop retinal neovascularization.  Other eye conditions develop retinal  neovascularization secondary to retinal venous occlusions.   Vitreous hemorrhage may result from spontaneous vitreous detachment or retinal breaks.  Blunt trauma is a common cause as well.  The need for serial evaluation of the fundus (interior of the eye) until  clear views are obtained was addressed. An occasional need  to monitor the condition by in office  B-scan ultrasonography in the case of dense vitreous hemorrhage was discussed.The nature of the vitreous hemorrhage was discussed with the patient as well as the common causes.   Patients with diabetic eye disease may develop retinal neovascularization.  Other eye conditions develop retinal  neovascularization secondary to retinal venous occlusions.   Vitreous hemorrhage may result from spontaneous vitreous detachment or retinal breaks.  Blunt trauma is a common cause as well.  The need for serial evaluation of the fundus (interior of the eye) until  clear views are obtained was addressed. An  occasional need  to monitor the condition by in office  B-scan ultrasonography in the case of dense vitreous hemorrhage was discussed.  OS with new onset, in eye with prior Hollenhorst plaque branch retinal artery occlusion possible neovascular disease secondary to the underlying condition  Proliferative diabetic retinopathy of left eye ([place]) Likely progression of severe NPDR to PDR exacerbated by nonperfusion of recent BRVO.  We will need to evaluate B-scan for vitreous hemorrhage to look for etiology and retinal tear detachment  Severe nonproliferative diabetic retinopathy of right eye, with macular edema, associated with type 2 diabetes mellitus ([place]) Very severe NPDR OD, I have discussed with the patient once the left eye has improved post vitrectomy to remove the dense vitreous hemorrhage, we will we will deliver implant deliver peripheral anterior PRP to permanently quiet down the severe NPDR to prevent progression to PDR and hemorrhage such as occurred now in the left eye      ICD-10-CM   1. Severe nonproliferative diabetic retinopathy of left eye, with macular edema, associated with type 2 diabetes mellitus (HCC)  R91.6384 Color Fundus Photography Optos - OU - Both Eyes  2. Vitreous hemorrhage of left eye (HCC)  H43.12 B-Scan Ultrasound - OS - Left Eye  3. Severe nonproliferative diabetic retinopathy of right eye, with macular edema, associated with type 2 diabetes mellitus ([place])  E11.3411   4. Proliferative diabetic retinopathy of left eye without macular edema associated with type 2 diabetes mellitus ([place])  Y65.9935     1.  OS with very dense vitreous hemorrhage.  Will need to schedule vitrectomy to clear the vitreous opacification so as to maximize visual potential to the level of count fingers as it was present in March.  Underlying visual acuity is limited by an underlying previous branch retinal artery occlusion stroke type issue.  2.  Risk and benefits reviewed.  I explained the  patient that surgical intervention of this type would be a roughly 15 to 25-minute procedure under local MAC anesthesia left eye.  Clearance of the hemorrhage and delivery of panel photocoagulation is likely going to induce quiescent's of diabetic retinopathy and neovascular stimulus from previous BRAO.  3.  OD in the future will need completion of PRP to prevent progression of severe NPDR to PDR due to patient's intolerance of intravitreal injections  Ophthalmic Meds Ordered this visit:  No orders of the defined types were placed in this encounter.      Return ,, [facility], for Schedule vitrectomy [id number], OS.  There are no Patient Instructions on file for this visit.   Explained the diagnoses, plan, and follow up with the patient and they expressed understanding.  Patient expressed understanding of the importance of proper follow up care.   [name] Letisia Schwalb M.D. Diseases & Surgery of the Retina and Vitreous [facility] [place] 02/07/21     Abbreviations: M myopia (nearsighted); A astigmatism; H hyperopia (farsighted); P presbyopia; Mrx spectacle prescription;  CTL contact lenses; OD right eye; OS left eye; OU both eyes  XT exotropia; ET esotropia; PEK punctate epithelial keratitis; PEE punctate epithelial erosions; DES dry eye syndrome; MGD meibomian gland dysfunction; ATs artificial tears; PFAT's preservative free artificial tears; [place] nuclear sclerotic cataract; PSC posterior subcapsular cataract; ERM epi-retinal membrane; PVD posterior vitreous detachment; RD retinal detachment; DM diabetes mellitus; DR diabetic retinopathy; NPDR non-proliferative diabetic retinopathy; PDR proliferative diabetic retinopathy; CSME clinically significant macular edema; DME diabetic macular edema; dbh dot blot hemorrhages; CWS cotton wool spot; POAG primary open angle glaucoma; C/D cup-to-disc ratio; HVF humphrey visual field; GVF goldmann visual field; OCT optical coherence  tomography; IOP intraocular pressure; BRVO Branch retinal vein occlusion; CRVO central retinal vein occlusion; CRAO central retinal artery occlusion; BRAO branch retinal artery occlusion; RT retinal tear; SB scleral buckle; PPV pars plana vitrectomy; VH Vitreous hemorrhage; PRP panretinal laser photocoagulation; IVK intravitreal kenalog; VMT vitreomacular traction; MH Macular hole;  NVD neovascularization of the disc; NVE neovascularization elsewhere; AREDS age related eye disease study; ARMD age related macular degeneration; POAG primary open angle glaucoma; EBMD epithelial/anterior basement membrane dystrophy; ACIOL anterior chamber intraocular lens; IOL intraocular lens; PCIOL posterior chamber intraocular lens; Phaco/IOL phacoemulsification with intraocular lens placement; [place] photorefractive keratectomy; LASIK laser assisted in situ keratomileusis; HTN hypertension; DM diabetes mellitus; COPD chronic obstructive pulmonary disease

## 2021-02-13 ENCOUNTER — Other Ambulatory Visit: Payer: Self-pay

## 2021-02-13 ENCOUNTER — Encounter (INDEPENDENT_AMBULATORY_CARE_PROVIDER_SITE_OTHER): Payer: Self-pay

## 2021-02-13 ENCOUNTER — Ambulatory Visit (INDEPENDENT_AMBULATORY_CARE_PROVIDER_SITE_OTHER): Payer: Medicare Other

## 2021-02-13 DIAGNOSIS — H4312 Vitreous hemorrhage, left eye: Secondary | ICD-10-CM

## 2021-02-13 MED ORDER — PREDNISOLONE ACETATE 1 % OP SUSP
1.0000 [drp] | Freq: Four times a day (QID) | OPHTHALMIC | 0 refills | Status: AC
Start: 2021-02-13 — End: 2021-03-06

## 2021-02-13 MED ORDER — CIPROFLOXACIN HCL 0.3 % OP SOLN
1.0000 [drp] | Freq: Four times a day (QID) | OPHTHALMIC | 0 refills | Status: AC
Start: 2021-02-13 — End: 2021-03-06

## 2021-02-13 NOTE — Progress Notes (Signed)
02/13/2021     CHIEF COMPLAINT Patient presents for Pre-op Exam (Pre-op Vitrectomy OS 02/20/2021/Pt states, "I still cannot see in my OS. Nothing is worse but I am ready to have this fixed."/A1C:8.1/LBS:160)   HISTORY OF PRESENT ILLNESS: James Blevins is a 75 y.o. male who presents to the clinic today for:   HPI    Pre-op Exam    Comments: Pre-op Vitrectomy OS 02/20/2021 Pt states, "I still cannot see in my OS. Nothing is worse but I am ready to have this fixed." A1C:8.1 LBS:160       Last edited by James Blevins, COA on 02/13/2021  1:27 PM. (History)        HISTORICAL INFORMATION:   Selected notes from the MEDICAL RECORD NUMBER    Lab Results  Component Value Date   HGBA1C 8.4 (H) 04/12/2016     CURRENT MEDICATIONS: Current Outpatient Medications (Ophthalmic Drugs)  Medication Sig  . ciprofloxacin (CILOXAN) 0.3 % ophthalmic solution Place 1 drop into the left eye in the morning, at noon, in the evening, and at bedtime for 21 days.  . prednisoLONE acetate (PRED FORTE) 1 % ophthalmic suspension Place 1 drop into the left eye 4 (four) times daily for 21 days.  [name] atropine 1 % ophthalmic solution Place 1 drop into the left eye 2 (two) times daily. (Patient not taking: No sig reported)  . prednisoLONE acetate (PRED FORTE) 1 % ophthalmic suspension Place 1 drop into the left eye 4 (four) times daily. (Patient not taking: No sig reported)   No current facility-administered medications for this visit. (Ophthalmic Drugs)   Current Outpatient Medications (Other)  Medication Sig  . aspirin 325 MG tablet Take 1 tablet (325 mg total) by mouth daily.  [name] atorvastatin (LIPITOR) 80 MG tablet Take 80 mg by mouth daily.  . clopidogrel (PLAVIX) 75 MG tablet Take 75 mg by mouth daily.  . cyanocobalamin (,VITAMIN B-12,) 1000 MCG/ML injection Please take once daily for next 7 days, then once weekly for 4 weeks, then once monthly.  [name] glipiZIDE (GLUCOTROL) 5 MG tablet Take 0.5 tablets  (2.5 mg total) by mouth 2 (two) times daily before a meal. Please take for 2 days, then stop once or you resumed her Janumet back  . sitaGLIPtin-metformin (JANUMET) 50-1000 MG tablet Take 1 tablet by mouth 2 (two) times daily with a meal. Please hold for next 2 days as you have received IV contrast during hospital stay, resume on 04/17/2016   No current facility-administered medications for this visit. (Other)     ALLERGIES No Known Allergies  PAST MEDICAL HISTORY Past Medical History:  Diagnosis Date  . Acute endophthalmitis of left eye 02/18/2020  . Cataracts, bilateral   . Diabetes ([place])   . Hypopyon of left eye 02/18/2020  . Retinal detachment left eye, 08/2013  . Skin cancer   . Stroke ([place])   . Stroke due to embolism ([place]) 01/14/2016   Past Surgical History:  Procedure Laterality Date  . COLONOSCOPY  2002  . EYE SURGERY    . PARS PLANA VITRECTOMY Left 02/18/2020   Procedure: PARS PLANA VITRECTOMY 25 GAUGE FOR ENDOPHTHALMITIS, ENDOPTHALMITIS PROTOCOL WITH ANTIBIOTIC INJECTION;  Surgeon: [name], MD;  Location: [facility];  Service: Ophthalmology;  Laterality: Left;  . skin cancer removal    . TONSILLECTOMY  childhood    FAMILY HISTORY Family History  Problem Relation Age of Onset  . Clotting disorder Mother        heart  attack  . Stroke Maternal Grandfather        stroke  . Clotting disorder Daughter        prothrombin gene mutation  . Colon cancer Neg Hx   . Esophageal cancer Neg Hx   . Rectal cancer Neg Hx   . Stomach cancer Neg Hx     SOCIAL HISTORY Social History   Tobacco Use  . Smoking status: Never Smoker  . Smokeless tobacco: Never Used  Substance Use Topics  . Alcohol use: Yes    Comment: rare  . Drug use: No         OPHTHALMIC EXAM:  Base Eye Exam    Visual Acuity (ETDRS)      Right Left   Dist [place] 20/25 -1 HM       Tonometry      Right Left   Pressure  11       Neuro/Psych    Oriented x3: Yes   Mood/Affect: Normal        Slit  Lamp and Fundus Exam    External Exam      Right Left   External Normal Normal       Slit Lamp Exam      Right Left   Lids/Lashes Normal Normal   Conjunctiva/Sclera White and quiet White and quiet   Cornea Clear Clear   Anterior Chamber Deep and quiet Deep and quiet   Iris Round and reactive Round and reactive   Lens Centered posterior chamber intraocular lens Centered posterior chamber intraocular lens   Anterior Vitreous Normal Normal       Fundus Exam      Right Left   Posterior Vitreous  Dense Vitreous hemorrhage   Disc  no details   C/D Ratio  no view due to hemorrhage   Macula  no details due to hemorrhage   Vessels  no details   Periphery  no details          IMAGING AND PROCEDURES  Imaging and Procedures for @TODAY @           ASSESSMENT/PLAN:  No diagnosis found.  Ophthalmic Meds Ordered this visit:  Meds ordered this encounter  Medications  . ciprofloxacin (CILOXAN) 0.3 % ophthalmic solution    Sig: Place 1 drop into the left eye in the morning, at noon, in the evening, and at bedtime for 21 days.    Dispense:  5 mL    Refill:  0  . prednisoLONE acetate (PRED FORTE) 1 % ophthalmic suspension    Sig: Place 1 drop into the left eye 4 (four) times daily for 21 days.    Dispense:  5 mL    Refill:  0        Pre-op completed. Operative consent obtained with pre-op eye drops reviewed with James Blevins and sent via [facility] as needed. Post op instructions reviewed with patient and per patient all questions answered.  [place], COA

## 2021-02-20 ENCOUNTER — Encounter (AMBULATORY_SURGERY_CENTER): Payer: Medicare Other | Admitting: Ophthalmology

## 2021-02-20 ENCOUNTER — Encounter (INDEPENDENT_AMBULATORY_CARE_PROVIDER_SITE_OTHER): Payer: Medicare Other | Admitting: Ophthalmology

## 2021-02-20 DIAGNOSIS — E113592 Type 2 diabetes mellitus with proliferative diabetic retinopathy without macular edema, left eye: Secondary | ICD-10-CM | POA: Diagnosis not present

## 2021-02-20 DIAGNOSIS — H4312 Vitreous hemorrhage, left eye: Secondary | ICD-10-CM

## 2021-02-21 ENCOUNTER — Other Ambulatory Visit: Payer: Self-pay

## 2021-02-21 ENCOUNTER — Ambulatory Visit (INDEPENDENT_AMBULATORY_CARE_PROVIDER_SITE_OTHER): Payer: Medicare Other | Admitting: Ophthalmology

## 2021-02-21 ENCOUNTER — Encounter (INDEPENDENT_AMBULATORY_CARE_PROVIDER_SITE_OTHER): Payer: Self-pay | Admitting: Ophthalmology

## 2021-02-21 DIAGNOSIS — E113592 Type 2 diabetes mellitus with proliferative diabetic retinopathy without macular edema, left eye: Secondary | ICD-10-CM

## 2021-02-21 DIAGNOSIS — H34232 Retinal artery branch occlusion, left eye: Secondary | ICD-10-CM

## 2021-02-21 DIAGNOSIS — H4312 Vitreous hemorrhage, left eye: Secondary | ICD-10-CM

## 2021-02-21 NOTE — Assessment & Plan Note (Signed)
Media now clear OS

## 2021-02-21 NOTE — Patient Instructions (Addendum)
Ofloxacin  4 times daily to the operative eye  Prednisolone acetate 1 drop to the operative eye 4 times daily  Patient instructed not to refill the medications and use them for maximum of 3 weeks.  Patient instructed do not rub the eye.  Patient has the option to use the patch at night.   OS, looks great status post vitrectomy endolaser PRP, positional choices allowed  Patient will be instructed to use a topical medications in the left eye for the next 3 weeks.  He is asked not to refill medications today be completed before that time.  He is also asked not to use the drops beyond the 3-week time.Ofloxacin  4 times daily to the operative eye  Prednisolone acetate 1 drop to the operative eye 4 times daily  Patient instructed not to refill the medications and use them for maximum of 3 weeks.  Patient instructed do not rub the eye.  Patient has the option to use the patch at night.   No lifting and bending for 1 week. No water IN the eye for 10 days. Do not rub the eye. Wear shield at night for 1-3 days.  Continue your topical medications for a total of 3 weeks.  Do not refill your postoperative medications unless instructed.  Refrain from exercise or intentional activity which increases our heart rate above resting levels.  Normal walking to complete normal activities of your day are appropriate.  Driving:  Legally, you only need one good eye, of 20/40 or better to drive.  However, the practice does not recommend driving during first weeks after surgery, IF you are uncomfortable with your visual functioning or capabilities.   If you have known sleep apnea, wear your CPAP as you normally should.

## 2021-02-21 NOTE — Assessment & Plan Note (Signed)
Postop day #1 status post vitrectomy endolaser PRP, looks great with medial clear

## 2021-02-21 NOTE — Assessment & Plan Note (Signed)
Underlying disease limits acuity

## 2021-02-21 NOTE — Progress Notes (Signed)
02/21/2021     CHIEF COMPLAINT Patient presents for Post-op Follow-up (1 Day POV OS//Pt denies ocular pain. No nausea or vomiting following sx. Pt reports slight itching OS.)   HISTORY OF PRESENT ILLNESS: James Blevins is a 75 y.o. male who presents to the clinic today for:   HPI     Post-op Follow-up           Laterality: left eye   Comments: 1 Day POV OS  Pt denies ocular pain. No nausea or vomiting following sx. Pt reports slight itching OS.       Last edited by Milly Jakob, [employer] on 02/21/2021  9:10 AM.      Referring physician: [name], MD [place],  [place] [id number]  HISTORICAL INFORMATION:   Selected notes from the MEDICAL RECORD NUMBER    Lab Results  Component Value Date   HGBA1C 8.4 (H) 04/12/2016     CURRENT MEDICATIONS: Current Outpatient Medications (Ophthalmic Drugs)  Medication Sig   atropine 1 % ophthalmic solution Place 1 drop into the left eye 2 (two) times daily. (Patient not taking: No sig reported)   ciprofloxacin (CILOXAN) 0.3 % ophthalmic solution Place 1 drop into the left eye in the morning, at noon, in the evening, and at bedtime for 21 days.   prednisoLONE acetate (PRED FORTE) 1 % ophthalmic suspension Place 1 drop into the left eye 4 (four) times daily. (Patient not taking: No sig reported)   prednisoLONE acetate (PRED FORTE) 1 % ophthalmic suspension Place 1 drop into the left eye 4 (four) times daily for 21 days.   No current facility-administered medications for this visit. (Ophthalmic Drugs)   Current Outpatient Medications (Other)  Medication Sig   aspirin 325 MG tablet Take 1 tablet (325 mg total) by mouth daily.   atorvastatin (LIPITOR) 80 MG tablet Take 80 mg by mouth daily.   clopidogrel (PLAVIX) 75 MG tablet Take 75 mg by mouth daily.   cyanocobalamin (,VITAMIN B-12,) 1000 MCG/ML injection Please take once daily for next 7 days, then once weekly for 4 weeks, then once monthly.   glipiZIDE  (GLUCOTROL) 5 MG tablet Take 0.5 tablets (2.5 mg total) by mouth 2 (two) times daily before a meal. Please take for 2 days, then stop once or you resumed her Janumet back   sitaGLIPtin-metformin (JANUMET) 50-1000 MG tablet Take 1 tablet by mouth 2 (two) times daily with a meal. Please hold for next 2 days as you have received IV contrast during hospital stay, resume on 04/17/2016   No current facility-administered medications for this visit. (Other)      REVIEW OF SYSTEMS:    ALLERGIES No Known Allergies  PAST MEDICAL HISTORY Past Medical History:  Diagnosis Date   Acute endophthalmitis of left eye 02/18/2020   Cataracts, bilateral    Diabetes ([place])    Hypopyon of left eye 02/18/2020   Retinal detachment left eye, 08/2013   Skin cancer    Stroke [facility])    Stroke due to embolism ([place]) 01/14/2016   Past Surgical History:  Procedure Laterality Date   COLONOSCOPY  2002   EYE SURGERY     PARS PLANA VITRECTOMY Left 02/18/2020   Procedure: PARS PLANA VITRECTOMY 25 GAUGE FOR ENDOPHTHALMITIS, ENDOPTHALMITIS PROTOCOL WITH ANTIBIOTIC INJECTION;  Surgeon: [name], MD;  Location: [place];  Service: Ophthalmology;  Laterality: Left;   skin cancer removal     TONSILLECTOMY  childhood    FAMILY HISTORY Family History  Problem Relation Age of Onset   Clotting disorder Mother        heart attack   Stroke Maternal Grandfather        stroke   Clotting disorder Daughter        prothrombin gene mutation   Colon cancer Neg Hx    Esophageal cancer Neg Hx    Rectal cancer Neg Hx    Stomach cancer Neg Hx     SOCIAL HISTORY Social History   Tobacco Use   Smoking status: Never   Smokeless tobacco: Never  Substance Use Topics   Alcohol use: Yes    Comment: rare   Drug use: No         OPHTHALMIC EXAM:  Base Eye Exam     Visual Acuity (ETDRS)       Right Left   Dist [place] 20/25 +1 CF @ 5'   Dist ph [place]  NI         Tonometry (Tonopen, 9:15 AM)       Right Left   Pressure  07 12         Pupils       Dark Light Shape React APD   Right 4 4 Round Minimal None   Left 8 8 Round Dilated None         Neuro/Psych     Oriented x3: Yes   Mood/Affect: Normal         Dilation     Left eye: 1.0% Mydriacyl, 2.5% Phenylephrine @ 9:15 AM           Slit Lamp and Fundus Exam     External Exam       Right Left   External Normal Normal         Slit Lamp Exam       Right Left   Lids/Lashes Normal Normal   Conjunctiva/Sclera White and quiet White and quiet   Cornea Clear Clear   Anterior Chamber Deep and quiet Deep and quiet   Iris Round and reactive Round and reactive   Lens Centered posterior chamber intraocular lens Centered posterior chamber intraocular lens   Anterior Vitreous Normal Normal         Fundus Exam       Right Left   Posterior Vitreous  clear , avitric   Disc  1+ Optic disc atrophy, 1+ Pallor   C/D Ratio  0.45   Macula  Attached, no macular thickening, Microaneurysms   Vessels  PDR-quiet   Periphery  Attached peripherally, 360 with good retinopexy            IMAGING AND PROCEDURES  Imaging and Procedures for 02/21/21           ASSESSMENT/PLAN:  Proliferative diabetic retinopathy of left eye (HCC) Postop day #1 status post vitrectomy endolaser PRP, looks great with medial clear  Branch retinal artery occlusion, left Underlying disease limits acuity  Vitreous hemorrhage of left eye (HCC) Media now clear OS     ICD-10-CM   1. Proliferative diabetic retinopathy of left eye without macular edema associated with type 2 diabetes mellitus ([place])  M57.8469     2. Branch retinal artery occlusion, left  H34.232     3. Vitreous hemorrhage of left eye (HCC)  H43.12       1.  OS looks great status post vitrectomy and laser PRP  2.  Follow-up in 1 week, color fundus photography and postop visit   Ordered this visit:  No orders of the defined types were placed in this encounter.      Return for RV  5 to 7 days, POST OP, dilate, OS, COLOR FP.  Patient Instructions  Ofloxacin  4 times daily to the operative eye  Prednisolone acetate 1 drop to the operative eye 4 times daily  Patient instructed not to refill the medications and use them for maximum of 3 weeks.  Patient instructed do not rub the eye.  Patient has the option to use the patch at night.   OS, looks great status post vitrectomy endolaser PRP, positional choices allowed  Patient will be instructed to use a topical medications in the left eye for the next 3 weeks.  He is asked not to refill medications today be completed before that time.  He is also asked not to use the drops beyond the 3-week time.Ofloxacin  4 times daily to the operative eye  Prednisolone acetate 1 drop to the operative eye 4 times daily  Patient instructed not to refill the medications and use them for maximum of 3 weeks.  Patient instructed do not rub the eye.  Patient has the option to use the patch at night.   No lifting and bending for 1 week. No water IN the eye for 10 days. Do not rub the eye. Wear shield at night for 1-3 days.  Continue your topical medications for a total of 3 weeks.  Do not refill your postoperative medications unless instructed.  Refrain from exercise or intentional activity which increases our heart rate above resting levels.  Normal walking to complete normal activities of your day are appropriate.  Driving:  Legally, you only need one good eye, of 20/40 or better to drive.  However, the practice does not recommend driving during first weeks after surgery, IF you are uncomfortable with your visual functioning or capabilities.   If you have known sleep apnea, wear your CPAP as you normally should.   Explained the diagnoses, plan, and follow up with the patient and they expressed understanding.  Patient expressed understanding of the importance of proper follow up care.   [name] Alanda Colton M.D. Diseases & Surgery of the  Retina and Vitreous Retina & Diabetic [place] 02/21/21     Abbreviations: M myopia (nearsighted); A astigmatism; H hyperopia (farsighted); P presbyopia; Mrx spectacle prescription;  CTL contact lenses; OD right eye; OS left eye; OU both eyes  XT exotropia; ET esotropia; PEK punctate epithelial keratitis; PEE punctate epithelial erosions; DES dry eye syndrome; MGD meibomian gland dysfunction; ATs artificial tears; PFAT's preservative free artificial tears; [place] nuclear sclerotic cataract; PSC posterior subcapsular cataract; ERM epi-retinal membrane; PVD posterior vitreous detachment; RD retinal detachment; DM diabetes mellitus; DR diabetic retinopathy; NPDR non-proliferative diabetic retinopathy; PDR proliferative diabetic retinopathy; CSME clinically significant macular edema; DME diabetic macular edema; dbh dot blot hemorrhages; CWS cotton wool spot; POAG primary open angle glaucoma; C/D cup-to-disc ratio; HVF humphrey visual field; GVF goldmann visual field; OCT optical coherence tomography; IOP intraocular pressure; BRVO Branch retinal vein occlusion; CRVO central retinal vein occlusion; CRAO central retinal artery occlusion; BRAO branch retinal artery occlusion; RT retinal tear; SB scleral buckle; PPV pars plana vitrectomy; VH Vitreous hemorrhage; PRP panretinal laser photocoagulation; IVK intravitreal kenalog; VMT vitreomacular traction; MH Macular hole;  NVD neovascularization of the disc; NVE neovascularization elsewhere; AREDS age related eye disease study; ARMD age related macular degeneration; POAG primary open angle glaucoma; EBMD epithelial/anterior basement membrane dystrophy; ACIOL anterior chamber intraocular lens; IOL intraocular lens;  PCIOL posterior chamber intraocular lens; Phaco/IOL phacoemulsification with intraocular lens placement; [place] photorefractive keratectomy; LASIK laser assisted in situ keratomileusis; HTN hypertension; DM diabetes mellitus; COPD chronic obstructive pulmonary  disease

## 2021-02-27 ENCOUNTER — Ambulatory Visit (INDEPENDENT_AMBULATORY_CARE_PROVIDER_SITE_OTHER): Payer: Medicare Other | Admitting: Ophthalmology

## 2021-02-27 ENCOUNTER — Encounter (INDEPENDENT_AMBULATORY_CARE_PROVIDER_SITE_OTHER): Payer: Self-pay | Admitting: Ophthalmology

## 2021-02-27 ENCOUNTER — Other Ambulatory Visit: Payer: Self-pay

## 2021-02-27 DIAGNOSIS — H4312 Vitreous hemorrhage, left eye: Secondary | ICD-10-CM

## 2021-02-27 DIAGNOSIS — E113411 Type 2 diabetes mellitus with severe nonproliferative diabetic retinopathy with macular edema, right eye: Secondary | ICD-10-CM

## 2021-02-27 NOTE — Assessment & Plan Note (Signed)
7 days status post vitrectomy for dense vitreous hemorrhage secondary to proliferative retinopathy from combination of of diabetic retinopathy and previous branch retinal artery occlusion.  Good PRP peripherally.  Visual acuity has returned to baseline as to prior to recent vitrectomy surgery

## 2021-02-27 NOTE — Assessment & Plan Note (Signed)
OD continue to monitor we will follow as scheduled or in 3 to 4 months

## 2021-02-27 NOTE — Progress Notes (Signed)
02/27/2021     CHIEF COMPLAINT Patient presents for Post-op Follow-up (5-7 day post-op sx OS 02/20/2021/Pt states, "My va is back to where it was before the bleeding happened. I have not had any issues. "/Pt reports using Ofloxacin and Pred QID OS "religiously")   HISTORY OF PRESENT ILLNESS: James Blevins is a 75 y.o. male who presents to the clinic today for:   HPI     Post-op Follow-up           Laterality: left eye   Discomfort: none.  Negative for pain and tearing   Vision: is stable   Comments: 5-7 day post-op sx OS 02/20/2021 Pt states, "My va is back to where it was before the bleeding happened. I have not had any issues. " Pt reports using Ofloxacin and Pred QID OS "religiously"       Last edited by Kendra Opitz, COA on 02/27/2021  8:30 AM.      Referring physician: [name], MD [place],  [place] [phone]  HISTORICAL INFORMATION:   Selected notes from the MEDICAL RECORD NUMBER    Lab Results  Component Value Date   HGBA1C 8.4 (H) 04/12/2016     CURRENT MEDICATIONS: Current Outpatient Medications (Ophthalmic Drugs)  Medication Sig   atropine 1 % ophthalmic solution Place 1 drop into the left eye 2 (two) times daily. (Patient not taking: No sig reported)   ciprofloxacin (CILOXAN) 0.3 % ophthalmic solution Place 1 drop into the left eye in the morning, at noon, in the evening, and at bedtime for 21 days.   prednisoLONE acetate (PRED FORTE) 1 % ophthalmic suspension Place 1 drop into the left eye 4 (four) times daily. (Patient not taking: No sig reported)   prednisoLONE acetate (PRED FORTE) 1 % ophthalmic suspension Place 1 drop into the left eye 4 (four) times daily for 21 days.   No current facility-administered medications for this visit. (Ophthalmic Drugs)   Current Outpatient Medications (Other)  Medication Sig   aspirin 325 MG tablet Take 1 tablet (325 mg total) by mouth daily.   atorvastatin (LIPITOR) 80 MG tablet Take 80  mg by mouth daily.   clopidogrel (PLAVIX) 75 MG tablet Take 75 mg by mouth daily.   cyanocobalamin (,VITAMIN B-12,) 1000 MCG/ML injection Please take once daily for next 7 days, then once weekly for 4 weeks, then once monthly.   glipiZIDE (GLUCOTROL) 5 MG tablet Take 0.5 tablets (2.5 mg total) by mouth 2 (two) times daily before a meal. Please take for 2 days, then stop once or you resumed her Janumet back   sitaGLIPtin-metformin (JANUMET) 50-1000 MG tablet Take 1 tablet by mouth 2 (two) times daily with a meal. Please hold for next 2 days as you have received IV contrast during hospital stay, resume on 04/17/2016   No current facility-administered medications for this visit. (Other)      REVIEW OF SYSTEMS:    ALLERGIES No Known Allergies  PAST MEDICAL HISTORY Past Medical History:  Diagnosis Date   Acute endophthalmitis of left eye 02/18/2020   Cataracts, bilateral    Diabetes ([place])    Hypopyon of left eye 02/18/2020   Retinal detachment left eye, 08/2013   Skin cancer    Stroke [facility])    Stroke due to embolism ([place]) 01/14/2016   Past Surgical History:  Procedure Laterality Date   COLONOSCOPY  2002   EYE SURGERY     PARS PLANA VITRECTOMY Left 02/18/2020   Procedure:  PARS PLANA VITRECTOMY 25 GAUGE FOR ENDOPHTHALMITIS, ENDOPTHALMITIS PROTOCOL WITH ANTIBIOTIC INJECTION;  Surgeon: [name], MD;  Location: [place];  Service: Ophthalmology;  Laterality: Left;   skin cancer removal     TONSILLECTOMY  childhood    FAMILY HISTORY Family History  Problem Relation Age of Onset   Clotting disorder Mother        heart attack   Stroke Maternal Grandfather        stroke   Clotting disorder Daughter        prothrombin gene mutation   Colon cancer Neg Hx    Esophageal cancer Neg Hx    Rectal cancer Neg Hx    Stomach cancer Neg Hx     SOCIAL HISTORY Social History   Tobacco Use   Smoking status: Never   Smokeless tobacco: Never  Substance Use Topics   Alcohol use: Yes     Comment: rare   Drug use: No         OPHTHALMIC EXAM:  Base Eye Exam     Visual Acuity (ETDRS)       Right Left   Dist [place] 20/30 +2 CF at 5'   Dist ph [place] NI          Tonometry (Tonopen, 8:35 AM)       Right Left   Pressure 15 12         Pupils       Pupils Dark Light Shape React APD   Right PERRL 6 5 Round Minimal None   Left PERRL 5 4 Round Minimal None         Visual Fields (Counting fingers)       Left Right    Full Full         Extraocular Movement       Right Left    Full Full         Neuro/Psych     Oriented x3: Yes   Mood/Affect: Normal         Dilation     Left eye: 1.0% Mydriacyl, 2.5% Phenylephrine @ 8:35 AM           Slit Lamp and Fundus Exam     External Exam       Right Left   External Normal Normal         Slit Lamp Exam       Right Left   Lids/Lashes Normal Normal   Conjunctiva/Sclera White and quiet White and quiet   Cornea Clear Clear   Anterior Chamber Deep and quiet Deep and quiet   Iris Round and reactive Round and reactive   Lens Centered posterior chamber intraocular lens Centered posterior chamber intraocular lens   Anterior Vitreous Normal Normal         Fundus Exam       Right Left   Posterior Vitreous  clear , avitric   Disc  1+ Optic disc atrophy, 1+ Pallor   C/D Ratio  0.45   Macula  Attached, no macular thickening, Microaneurysms   Vessels  PDR-quiet   Periphery  Attached peripherally, 360 with good retinopexy            IMAGING AND PROCEDURES  Imaging and Procedures for 02/27/21           ASSESSMENT/PLAN:  Vitreous hemorrhage of left eye (HCC) 7 days status post vitrectomy for dense vitreous hemorrhage secondary to proliferative retinopathy from combination of of diabetic retinopathy and previous branch retinal  artery occlusion.  Good PRP peripherally.  Visual acuity has returned to baseline as to prior to recent vitrectomy surgery  Severe nonproliferative  diabetic retinopathy of right eye, with macular edema, associated with type 2 diabetes mellitus ([place]) OD continue to monitor we will follow as scheduled or in 3 to 4 months     ICD-10-CM   1. Vitreous hemorrhage of left eye ([place])  H43.12     2. Severe nonproliferative diabetic retinopathy of right eye, with macular edema, associated with type 2 diabetes mellitus ([place])  E11.3411       1.  OS looks great status post vitrectomy now clear vitreous with successful restoration of visual acuity that was present prior to development of vitreous hemorrhage,  2.  Patient instructed to continue eye medications topically for the next 2 weeks but do not refill the bottles if they are completed before that time.    3.  Any remaining medications in her possession at the end of 2 weeks should be stopped.  Ophthalmic Meds Ordered this visit:  No orders of the defined types were placed in this encounter.      Return in about 8 weeks (around 04/24/2021) for COLOR FP, OCT, dilate, OS, POST OP.  There are no Patient Instructions on file for this visit.   Explained the diagnoses, plan, and follow up with the patient and they expressed understanding.  Patient expressed understanding of the importance of proper follow up care.   [name] Alayzha An M.D. Diseases & Surgery of the Retina and Vitreous Retina & Diabetic [place] 02/27/21     Abbreviations: M myopia (nearsighted); A astigmatism; H hyperopia (farsighted); P presbyopia; Mrx spectacle prescription;  CTL contact lenses; OD right eye; OS left eye; OU both eyes  XT exotropia; ET esotropia; PEK punctate epithelial keratitis; PEE punctate epithelial erosions; DES dry eye syndrome; MGD meibomian gland dysfunction; ATs artificial tears; PFAT's preservative free artificial tears; [place] nuclear sclerotic cataract; PSC posterior subcapsular cataract; ERM epi-retinal membrane; PVD posterior vitreous detachment; RD retinal detachment; DM diabetes mellitus; DR  diabetic retinopathy; NPDR non-proliferative diabetic retinopathy; PDR proliferative diabetic retinopathy; CSME clinically significant macular edema; DME diabetic macular edema; dbh dot blot hemorrhages; CWS cotton wool spot; POAG primary open angle glaucoma; C/D cup-to-disc ratio; HVF humphrey visual field; GVF goldmann visual field; OCT optical coherence tomography; IOP intraocular pressure; BRVO Branch retinal vein occlusion; CRVO central retinal vein occlusion; CRAO central retinal artery occlusion; BRAO branch retinal artery occlusion; RT retinal tear; SB scleral buckle; PPV pars plana vitrectomy; VH Vitreous hemorrhage; PRP panretinal laser photocoagulation; IVK intravitreal kenalog; VMT vitreomacular traction; MH Macular hole;  NVD neovascularization of the disc; NVE neovascularization elsewhere; AREDS age related eye disease study; ARMD age related macular degeneration; POAG primary open angle glaucoma; EBMD epithelial/anterior basement membrane dystrophy; ACIOL anterior chamber intraocular lens; IOL intraocular lens; PCIOL posterior chamber intraocular lens; Phaco/IOL phacoemulsification with intraocular lens placement; [place] photorefractive keratectomy; LASIK laser assisted in situ keratomileusis; HTN hypertension; DM diabetes mellitus; COPD chronic obstructive pulmonary disease

## 2021-03-26 ENCOUNTER — Encounter (INDEPENDENT_AMBULATORY_CARE_PROVIDER_SITE_OTHER): Payer: Medicare Other | Admitting: Ophthalmology

## 2021-03-28 DIAGNOSIS — D692 Other nonthrombocytopenic purpura: Secondary | ICD-10-CM | POA: Diagnosis not present

## 2021-03-28 DIAGNOSIS — L821 Other seborrheic keratosis: Secondary | ICD-10-CM | POA: Diagnosis not present

## 2021-03-28 DIAGNOSIS — C44612 Basal cell carcinoma of skin of right upper limb, including shoulder: Secondary | ICD-10-CM | POA: Diagnosis not present

## 2021-03-28 DIAGNOSIS — Z85828 Personal history of other malignant neoplasm of skin: Secondary | ICD-10-CM | POA: Diagnosis not present

## 2021-03-28 DIAGNOSIS — Z85820 Personal history of malignant melanoma of skin: Secondary | ICD-10-CM | POA: Diagnosis not present

## 2021-03-28 DIAGNOSIS — L570 Actinic keratosis: Secondary | ICD-10-CM | POA: Diagnosis not present

## 2021-04-24 ENCOUNTER — Encounter (INDEPENDENT_AMBULATORY_CARE_PROVIDER_SITE_OTHER): Payer: Self-pay | Admitting: Ophthalmology

## 2021-04-24 ENCOUNTER — Other Ambulatory Visit: Payer: Self-pay

## 2021-04-24 ENCOUNTER — Ambulatory Visit (INDEPENDENT_AMBULATORY_CARE_PROVIDER_SITE_OTHER): Payer: Medicare Other | Admitting: Ophthalmology

## 2021-04-24 DIAGNOSIS — H33319 Horseshoe tear of retina without detachment, unspecified eye: Secondary | ICD-10-CM | POA: Insufficient documentation

## 2021-04-24 DIAGNOSIS — H33311 Horseshoe tear of retina without detachment, right eye: Secondary | ICD-10-CM | POA: Insufficient documentation

## 2021-04-24 DIAGNOSIS — E113592 Type 2 diabetes mellitus with proliferative diabetic retinopathy without macular edema, left eye: Secondary | ICD-10-CM

## 2021-04-24 DIAGNOSIS — E113411 Type 2 diabetes mellitus with severe nonproliferative diabetic retinopathy with macular edema, right eye: Secondary | ICD-10-CM

## 2021-04-24 DIAGNOSIS — H4312 Vitreous hemorrhage, left eye: Secondary | ICD-10-CM

## 2021-04-24 NOTE — Assessment & Plan Note (Signed)
Stable severe NPDR OD

## 2021-04-24 NOTE — Assessment & Plan Note (Signed)
No new breaks stable finding OD

## 2021-04-24 NOTE — Progress Notes (Signed)
04/24/2021     CHIEF COMPLAINT Patient presents for Post-op Follow-up (5-7 day post-op sx OS 02/20/2021/Pt states, "My va is back to where it was before the bleeding happened. I have not had any issues. "/Pt reports using Ofloxacin and Pred QID OS "religiously")   HISTORY OF PRESENT ILLNESS: James Blevins is a 75 y.o. male who presents to the clinic today for:   HPI     Post-op Follow-up           Laterality: left eye   Discomfort: none.  Negative for pain and tearing   Vision: is improved and is stable   Comments: 5-7 day post-op sx OS 02/20/2021 Pt states, "My va is back to where it was before the bleeding happened. I have not had any issues. " Pt reports using Ofloxacin and Pred QID OS "religiously"         Comments   8 wk po os oct fp sx 02/20/21 Patient states his vision is stable. States he used the atropine os bid and prednisolone os qid and his vision improved, discontinued drops after 2 weeks. Denies new FOL/ floaters.        Last edited by Laurin Coder, COA on 04/24/2021 10:59 AM.      Referring physician: [name], MD [place],  [place] [address]  HISTORICAL INFORMATION:   Selected notes from the MEDICAL RECORD NUMBER    Lab Results  Component Value Date   HGBA1C 8.4 (H) 04/12/2016     CURRENT MEDICATIONS: Current Outpatient Medications (Ophthalmic Drugs)  Medication Sig   atropine 1 % ophthalmic solution Place 1 drop into the left eye 2 (two) times daily. (Patient not taking: No sig reported)   prednisoLONE acetate (PRED FORTE) 1 % ophthalmic suspension Place 1 drop into the left eye 4 (four) times daily. (Patient not taking: No sig reported)   No current facility-administered medications for this visit. (Ophthalmic Drugs)   Current Outpatient Medications (Other)  Medication Sig   aspirin 325 MG tablet Take 1 tablet (325 mg total) by mouth daily.   atorvastatin (LIPITOR) 80 MG tablet Take 80 mg by mouth daily.    clopidogrel (PLAVIX) 75 MG tablet Take 75 mg by mouth daily.   cyanocobalamin (,VITAMIN B-12,) 1000 MCG/ML injection Please take once daily for next 7 days, then once weekly for 4 weeks, then once monthly.   glipiZIDE (GLUCOTROL) 5 MG tablet Take 0.5 tablets (2.5 mg total) by mouth 2 (two) times daily before a meal. Please take for 2 days, then stop once or you resumed her Janumet back   sitaGLIPtin-metformin (JANUMET) 50-1000 MG tablet Take 1 tablet by mouth 2 (two) times daily with a meal. Please hold for next 2 days as you have received IV contrast during hospital stay, resume on 04/17/2016   No current facility-administered medications for this visit. (Other)      REVIEW OF SYSTEMS:    ALLERGIES No Known Allergies  PAST MEDICAL HISTORY Past Medical History:  Diagnosis Date   Acute endophthalmitis of left eye 02/18/2020   Cataracts, bilateral    Diabetes ([place])    Hypopyon of left eye 02/18/2020   Retinal detachment left eye, 08/2013   Skin cancer    Stroke [facility])    Stroke due to embolism ([place]) 01/14/2016   Past Surgical History:  Procedure Laterality Date   COLONOSCOPY  2002   EYE SURGERY     PARS PLANA VITRECTOMY Left 02/18/2020   Procedure: PARS  PLANA VITRECTOMY 25 GAUGE FOR ENDOPHTHALMITIS, ENDOPTHALMITIS PROTOCOL WITH ANTIBIOTIC INJECTION;  Surgeon: [name], MD;  Location: [place];  Service: Ophthalmology;  Laterality: Left;   skin cancer removal     TONSILLECTOMY  childhood    FAMILY HISTORY Family History  Problem Relation Age of Onset   Clotting disorder Mother        heart attack   Stroke Maternal Grandfather        stroke   Clotting disorder Daughter        prothrombin gene mutation   Colon cancer Neg Hx    Esophageal cancer Neg Hx    Rectal cancer Neg Hx    Stomach cancer Neg Hx     SOCIAL HISTORY Social History   Tobacco Use   Smoking status: Never   Smokeless tobacco: Never  Substance Use Topics   Alcohol use: Yes    Comment: rare   Drug  use: No         OPHTHALMIC EXAM:  Base Eye Exam     Visual Acuity (ETDRS)       Right Left   Dist [place] 20/25 -1 CF 5'   Dist ph [place]  NI         Tonometry (Tonopen, 11:08 AM)       Right Left   Pressure 10 14         Pupils       Pupils Dark Light Shape React APD   Right PERRL 5 4 Round Brisk None   Left PERRL 5 4 Round Brisk None         Visual Fields (Counting fingers)       Left Right    Full Full         Extraocular Movement       Right Left    Full Full         Neuro/Psych     Oriented x3: Yes   Mood/Affect: Normal         Dilation     Left eye: 1.0% Mydriacyl, 2.5% Phenylephrine @ 11:08 AM           Slit Lamp and Fundus Exam     External Exam       Right Left   External Normal Normal         Slit Lamp Exam       Right Left   Lids/Lashes Normal Normal   Conjunctiva/Sclera White and quiet White and quiet   Cornea Clear Clear   Anterior Chamber Deep and quiet Deep and quiet   Iris Round and reactive Round and reactive   Lens Centered posterior chamber intraocular lens Centered posterior chamber intraocular lens   Anterior Vitreous Normal Normal         Fundus Exam       Right Left   Posterior Vitreous  clear , avitric   Disc  1+ Optic disc atrophy, 1+ Pallor   C/D Ratio  0.45   Macula  Attached, no macular thickening clinically, Microaneurysms   Vessels  PDR-quiet   Periphery  Attached peripherally, 360 with good retinopexy and good PRP 360            IMAGING AND PROCEDURES  Imaging and Procedures for 04/24/21  OCT, Retina - OU - Both Eyes       Right Eye Quality was good. Scan locations included subfoveal. Central Foveal Thickness: 285. Progression has improved. Findings include abnormal foveal contour.   Left  Eye Quality was good. Scan locations included subfoveal. Central Foveal Thickness: 408. Progression has improved. Findings include abnormal foveal contour, cystoid macular edema.    Notes Much improved CSME OD status post focal grid laser photocoagulation November 2021  OS vastly improved CSME OS status post vitrectomy endolaser left eye     Color Fundus Photography Optos - OU - Both Eyes       Right Eye Progression has been stable. Macula : edema.   Left Eye Progression has worsened. Disc findings include pallor. Macula : microaneurysms, edema.   Notes  OD with retinopexy superotemporal for operculated retinal tear.  Severe NPDR with no progression OD evident  Clear media OS post vitrectomy good PRP less active PDR             ASSESSMENT/PLAN:  Proliferative diabetic retinopathy of left eye ([place]) Vitrectomy PRP left eye completed 02/20/2021, now with massive improvement in macular CSME thickening as well each improving nicely.  Severe nonproliferative diabetic retinopathy of right eye, with macular edema, associated with type 2 diabetes mellitus (HCC) Stable severe NPDR OD  Retinal horseshoe tear without detachment No new breaks stable finding OD     ICD-10-CM   1. Vitreous hemorrhage of left eye (HCC)  H43.12 OCT, Retina - OU - Both Eyes    Color Fundus Photography Optos - OU - Both Eyes    2. Proliferative diabetic retinopathy of left eye without macular edema associated with type 2 diabetes mellitus ([place])  [id number]     3. Severe nonproliferative diabetic retinopathy of right eye, with macular edema, associated with type 2 diabetes mellitus ([place])  E11.3411     4. Horseshoe tear of retina of right eye without detachment  H33.311       1.  OS less active PDR much less active CSME centrally post vitrectomy for vitreous hemorrhage with additional PRP.  2.  OD with severe NPDR stable over time.  3.  OD with old history of retinal tear superotemporal no changes.  Ophthalmic Meds Ordered this visit:  No orders of the defined types were placed in this encounter.      Return in about 3 months (around 07/25/2021) for DILATE OU, COLOR  FP, OCT.  There are no Patient Instructions on file for this visit.   Explained the diagnoses, plan, and follow up with the patient and they expressed understanding.  Patient expressed understanding of the importance of proper follow up care.   [name] Ailyn Gladd M.D. Diseases & Surgery of the Retina and Vitreous Retina & Diabetic [place] 04/24/21     Abbreviations: M myopia (nearsighted); A astigmatism; H hyperopia (farsighted); P presbyopia; Mrx spectacle prescription;  CTL contact lenses; OD right eye; OS left eye; OU both eyes  XT exotropia; ET esotropia; PEK punctate epithelial keratitis; PEE punctate epithelial erosions; DES dry eye syndrome; MGD meibomian gland dysfunction; ATs artificial tears; PFAT's preservative free artificial tears; [place] nuclear sclerotic cataract; PSC posterior subcapsular cataract; ERM epi-retinal membrane; PVD posterior vitreous detachment; RD retinal detachment; DM diabetes mellitus; DR diabetic retinopathy; NPDR non-proliferative diabetic retinopathy; PDR proliferative diabetic retinopathy; CSME clinically significant macular edema; DME diabetic macular edema; dbh dot blot hemorrhages; CWS cotton wool spot; POAG primary open angle glaucoma; C/D cup-to-disc ratio; HVF humphrey visual field; GVF goldmann visual field; OCT optical coherence tomography; IOP intraocular pressure; BRVO Branch retinal vein occlusion; CRVO central retinal vein occlusion; CRAO central retinal artery occlusion; BRAO branch retinal artery occlusion; RT retinal tear; SB scleral buckle; PPV pars  plana vitrectomy; VH Vitreous hemorrhage; PRP panretinal laser photocoagulation; IVK intravitreal kenalog; VMT vitreomacular traction; MH Macular hole;  NVD neovascularization of the disc; NVE neovascularization elsewhere; AREDS age related eye disease study; ARMD age related macular degeneration; POAG primary open angle glaucoma; EBMD epithelial/anterior basement membrane dystrophy; ACIOL anterior chamber  intraocular lens; IOL intraocular lens; PCIOL posterior chamber intraocular lens; Phaco/IOL phacoemulsification with intraocular lens placement; [place] photorefractive keratectomy; LASIK laser assisted in situ keratomileusis; HTN hypertension; DM diabetes mellitus; COPD chronic obstructive pulmonary disease

## 2021-04-24 NOTE — Assessment & Plan Note (Signed)
Vitrectomy PRP left eye completed 02/20/2021, now with massive improvement in macular CSME thickening as well each improving nicely.

## 2021-04-29 DIAGNOSIS — Z85820 Personal history of malignant melanoma of skin: Secondary | ICD-10-CM | POA: Diagnosis not present

## 2021-04-29 DIAGNOSIS — L821 Other seborrheic keratosis: Secondary | ICD-10-CM | POA: Diagnosis not present

## 2021-04-29 DIAGNOSIS — Z85828 Personal history of other malignant neoplasm of skin: Secondary | ICD-10-CM | POA: Diagnosis not present

## 2021-04-29 DIAGNOSIS — D485 Neoplasm of uncertain behavior of skin: Secondary | ICD-10-CM | POA: Diagnosis not present

## 2021-04-30 DIAGNOSIS — D510 Vitamin B12 deficiency anemia due to intrinsic factor deficiency: Secondary | ICD-10-CM | POA: Diagnosis not present

## 2021-05-01 DIAGNOSIS — E1159 Type 2 diabetes mellitus with other circulatory complications: Secondary | ICD-10-CM | POA: Diagnosis not present

## 2021-05-01 DIAGNOSIS — H0100B Unspecified blepharitis left eye, upper and lower eyelids: Secondary | ICD-10-CM | POA: Diagnosis not present

## 2021-05-01 DIAGNOSIS — H0100A Unspecified blepharitis right eye, upper and lower eyelids: Secondary | ICD-10-CM | POA: Diagnosis not present

## 2021-05-01 DIAGNOSIS — Z125 Encounter for screening for malignant neoplasm of prostate: Secondary | ICD-10-CM | POA: Diagnosis not present

## 2021-05-01 DIAGNOSIS — H1131 Conjunctival hemorrhage, right eye: Secondary | ICD-10-CM | POA: Diagnosis not present

## 2021-05-01 DIAGNOSIS — E785 Hyperlipidemia, unspecified: Secondary | ICD-10-CM | POA: Diagnosis not present

## 2021-05-01 DIAGNOSIS — H04123 Dry eye syndrome of bilateral lacrimal glands: Secondary | ICD-10-CM | POA: Diagnosis not present

## 2021-05-07 DIAGNOSIS — Z0000 Encounter for general adult medical examination without abnormal findings: Secondary | ICD-10-CM | POA: Diagnosis not present

## 2021-05-07 DIAGNOSIS — E1159 Type 2 diabetes mellitus with other circulatory complications: Secondary | ICD-10-CM | POA: Diagnosis not present

## 2021-05-07 DIAGNOSIS — R82998 Other abnormal findings in urine: Secondary | ICD-10-CM | POA: Diagnosis not present

## 2021-05-07 DIAGNOSIS — I129 Hypertensive chronic kidney disease with stage 1 through stage 4 chronic kidney disease, or unspecified chronic kidney disease: Secondary | ICD-10-CM | POA: Diagnosis not present

## 2021-05-07 DIAGNOSIS — H3500 Unspecified background retinopathy: Secondary | ICD-10-CM | POA: Diagnosis not present

## 2021-07-22 DIAGNOSIS — R0981 Nasal congestion: Secondary | ICD-10-CM | POA: Diagnosis not present

## 2021-07-22 DIAGNOSIS — E1159 Type 2 diabetes mellitus with other circulatory complications: Secondary | ICD-10-CM | POA: Diagnosis not present

## 2021-07-22 DIAGNOSIS — J029 Acute pharyngitis, unspecified: Secondary | ICD-10-CM | POA: Diagnosis not present

## 2021-07-22 DIAGNOSIS — R051 Acute cough: Secondary | ICD-10-CM | POA: Diagnosis not present

## 2021-07-25 ENCOUNTER — Other Ambulatory Visit: Payer: Self-pay

## 2021-07-25 ENCOUNTER — Ambulatory Visit (INDEPENDENT_AMBULATORY_CARE_PROVIDER_SITE_OTHER): Payer: Medicare Other | Admitting: Ophthalmology

## 2021-07-25 ENCOUNTER — Encounter (INDEPENDENT_AMBULATORY_CARE_PROVIDER_SITE_OTHER): Payer: Self-pay | Admitting: Ophthalmology

## 2021-07-25 DIAGNOSIS — E113512 Type 2 diabetes mellitus with proliferative diabetic retinopathy with macular edema, left eye: Secondary | ICD-10-CM | POA: Diagnosis not present

## 2021-07-25 DIAGNOSIS — H4312 Vitreous hemorrhage, left eye: Secondary | ICD-10-CM

## 2021-07-25 DIAGNOSIS — E113411 Type 2 diabetes mellitus with severe nonproliferative diabetic retinopathy with macular edema, right eye: Secondary | ICD-10-CM

## 2021-07-25 DIAGNOSIS — H33311 Horseshoe tear of retina without detachment, right eye: Secondary | ICD-10-CM

## 2021-07-25 NOTE — Progress Notes (Signed)
07/25/2021     CHIEF COMPLAINT Patient presents for  Chief Complaint  Patient presents with   Retina Follow Up      HISTORY OF PRESENT ILLNESS: James Blevins is a 75 y.o. male who presents to the clinic today for:   HPI     Retina Follow Up   Patient presents with  Other.  In both eyes.  This started 3 months ago.  Duration of 3 months.  Since onset it is stable.        Comments   3 month f/u OU with OCT and FP      Last edited by Reather Littler, COA on 07/25/2021 11:08 AM.      Referring physician: [name], MD [place],  [place] [address]  HISTORICAL INFORMATION:   Selected notes from the MEDICAL RECORD NUMBER    Lab Results  Component Value Date   HGBA1C 8.4 (H) 04/12/2016     CURRENT MEDICATIONS: Current Outpatient Medications (Ophthalmic Drugs)  Medication Sig   atropine 1 % ophthalmic solution Place 1 drop into the left eye 2 (two) times daily. (Patient not taking: No sig reported)   prednisoLONE acetate (PRED FORTE) 1 % ophthalmic suspension Place 1 drop into the left eye 4 (four) times daily. (Patient not taking: No sig reported)   No current facility-administered medications for this visit. (Ophthalmic Drugs)   Current Outpatient Medications (Other)  Medication Sig   aspirin 325 MG tablet Take 1 tablet (325 mg total) by mouth daily.   atorvastatin (LIPITOR) 80 MG tablet Take 80 mg by mouth daily.   clopidogrel (PLAVIX) 75 MG tablet Take 75 mg by mouth daily.   cyanocobalamin (,VITAMIN B-12,) 1000 MCG/ML injection Please take once daily for next 7 days, then once weekly for 4 weeks, then once monthly.   glipiZIDE (GLUCOTROL) 5 MG tablet Take 0.5 tablets (2.5 mg total) by mouth 2 (two) times daily before a meal. Please take for 2 days, then stop once or you resumed her Janumet back   sitaGLIPtin-metformin (JANUMET) 50-1000 MG tablet Take 1 tablet by mouth 2 (two) times daily with a meal. Please hold for next 2 days as you  have received IV contrast during hospital stay, resume on 04/17/2016   No current facility-administered medications for this visit. (Other)      REVIEW OF SYSTEMS:    ALLERGIES No Known Allergies  PAST MEDICAL HISTORY Past Medical History:  Diagnosis Date   Acute endophthalmitis of left eye 02/18/2020   Cataracts, bilateral    Diabetes ([place])    Hypopyon of left eye 02/18/2020   Retinal detachment left eye, 08/2013   Skin cancer    Stroke [facility])    Stroke due to embolism ([place]) 01/14/2016   Past Surgical History:  Procedure Laterality Date   COLONOSCOPY  2002   EYE SURGERY     PARS PLANA VITRECTOMY Left 02/18/2020   Procedure: PARS PLANA VITRECTOMY 25 GAUGE FOR ENDOPHTHALMITIS, ENDOPTHALMITIS PROTOCOL WITH ANTIBIOTIC INJECTION;  Surgeon: [name], MD;  Location: [place];  Service: Ophthalmology;  Laterality: Left;   skin cancer removal     TONSILLECTOMY  childhood    FAMILY HISTORY Family History  Problem Relation Age of Onset   Clotting disorder Mother        heart attack   Stroke Maternal Grandfather        stroke   Clotting disorder Daughter        prothrombin gene mutation  Colon cancer Neg Hx    Esophageal cancer Neg Hx    Rectal cancer Neg Hx    Stomach cancer Neg Hx     SOCIAL HISTORY Social History   Tobacco Use   Smoking status: Never   Smokeless tobacco: Never  Substance Use Topics   Alcohol use: Yes    Comment: rare   Drug use: No         OPHTHALMIC EXAM:  Base Eye Exam     Visual Acuity (ETDRS)       Right Left   Dist [place] 20/25 +2 CF@5ft    Dist ph [place]  NI         Tonometry (Tonopen, 11:14 AM)       Right Left   Pressure 10 8         Pupils       Dark Light Shape React APD   Right 5 4 Round Brisk None   Left 5 4 Round Brisk +1         Visual Fields (Counting fingers)       Left Right    Full Full         Extraocular Movement       Right Left    Full, Ortho Full, Ortho         Neuro/Psych      Oriented x3: Yes   Mood/Affect: Normal         Dilation     Both eyes: 1.0% Mydriacyl, 2.5% Phenylephrine @ 11:14 AM           Slit Lamp and Fundus Exam     External Exam       Right Left   External Normal Normal         Slit Lamp Exam       Right Left   Lids/Lashes Normal Normal   Conjunctiva/Sclera White and quiet White and quiet   Cornea Clear Clear   Anterior Chamber Deep and quiet Deep and quiet   Iris Round and reactive Round and reactive   Lens Centered posterior chamber intraocular lens Centered posterior chamber intraocular lens   Anterior Vitreous Normal Normal         Fundus Exam       Right Left   Posterior Vitreous Normal clear , avitric   Disc Normal 1+ Optic disc atrophy, 1+ Pallor   C/D Ratio 0.55 0.5   Macula Microaneurysms, Mild clinically significant macular edema much less Attached, Microaneurysms   Vessels NPDR-Severe PDR-quiet   Periphery Normal Attached peripherally, 360 with good retinopexy and good PRP 360            IMAGING AND PROCEDURES  Imaging and Procedures for 07/25/21  OCT, Retina - OU - Both Eyes       Right Eye Quality was good. Scan locations included subfoveal. Central Foveal Thickness: 301. Progression has improved. Findings include abnormal foveal contour.   Left Eye Quality was good. Scan locations included subfoveal. Central Foveal Thickness: 330. Progression has improved. Findings include abnormal foveal contour, cystoid macular edema.   Notes Much improved CSME OD status post focal grid laser photocoagulation November 2021, not active OD.  OS vastly improved CSME OS status post vitrectomy endolaser left eye, much less CSME overall.     Color Fundus Photography Optos - OU - Both Eyes       Right Eye Progression has been stable. Macula : edema.   Left Eye Progression has worsened. Disc findings include  pallor. Macula : microaneurysms, edema.   Notes Good retinopexy superotemporal and  superonasal OD media clear.  OS with less macular thickening Less exudate, good PRP peripherally with good buckle             ASSESSMENT/PLAN:  Vitreous hemorrhage of left eye (HCC) OS currently 5 months post vitrectomy PRP for vitreous hemorrhage secondary to proliferative diabetic retinopathy  Proliferative diabetic retinopathy of left eye (HCC) Quiescent PDR OS and improving CSME with residual visual loss on the basis of foveal atrophy  Severe nonproliferative diabetic retinopathy of right eye, with macular edema, associated with type 2 diabetes mellitus (HCC) OD no signs of progression of severe NPDR will observe  Horseshoe retinal tear of right eye History of retinal tear, superotemporal and superonasal good laser retinopexy no new breaks      ICD-10-CM   1. Vitreous hemorrhage of left eye (HCC)  H43.12 OCT, Retina - OU - Both Eyes    Color Fundus Photography Optos - OU - Both Eyes    2. Proliferative diabetic retinopathy of left eye with macular edema associated with type 2 diabetes mellitus ([place])  T70.1779     3. Severe nonproliferative diabetic retinopathy of right eye, with macular edema, associated with type 2 diabetes mellitus ([place])  E11.3411     4. Horseshoe retinal tear of right eye  H33.311       1.  2.  3.  Ophthalmic Meds Ordered this visit:  No orders of the defined types were placed in this encounter.      Return in about 6 months (around 01/22/2022) for DILATE OU, COLOR FP, OCT.  There are no Patient Instructions on file for this visit.   Explained the diagnoses, plan, and follow up with the patient and they expressed understanding.  Patient expressed understanding of the importance of proper follow up care.   [name] Chaniqua Brisby M.D. Diseases & Surgery of the Retina and Vitreous Retina & Diabetic [place] 07/25/21     Abbreviations: M myopia (nearsighted); A astigmatism; H hyperopia (farsighted); P presbyopia; Mrx spectacle  prescription;  CTL contact lenses; OD right eye; OS left eye; OU both eyes  XT exotropia; ET esotropia; PEK punctate epithelial keratitis; PEE punctate epithelial erosions; DES dry eye syndrome; MGD meibomian gland dysfunction; ATs artificial tears; PFAT's preservative free artificial tears; [place] nuclear sclerotic cataract; PSC posterior subcapsular cataract; ERM epi-retinal membrane; PVD posterior vitreous detachment; RD retinal detachment; DM diabetes mellitus; DR diabetic retinopathy; NPDR non-proliferative diabetic retinopathy; PDR proliferative diabetic retinopathy; CSME clinically significant macular edema; DME diabetic macular edema; dbh dot blot hemorrhages; CWS cotton wool spot; POAG primary open angle glaucoma; C/D cup-to-disc ratio; HVF humphrey visual field; GVF goldmann visual field; OCT optical coherence tomography; IOP intraocular pressure; BRVO Branch retinal vein occlusion; CRVO central retinal vein occlusion; CRAO central retinal artery occlusion; BRAO branch retinal artery occlusion; RT retinal tear; SB scleral buckle; PPV pars plana vitrectomy; VH Vitreous hemorrhage; PRP panretinal laser photocoagulation; IVK intravitreal kenalog; VMT vitreomacular traction; MH Macular hole;  NVD neovascularization of the disc; NVE neovascularization elsewhere; AREDS age related eye disease study; ARMD age related macular degeneration; POAG primary open angle glaucoma; EBMD epithelial/anterior basement membrane dystrophy; ACIOL anterior chamber intraocular lens; IOL intraocular lens; PCIOL posterior chamber intraocular lens; Phaco/IOL phacoemulsification with intraocular lens placement; [place] photorefractive keratectomy; LASIK laser assisted in situ keratomileusis; HTN hypertension; DM diabetes mellitus; COPD chronic obstructive pulmonary disease

## 2021-07-25 NOTE — Assessment & Plan Note (Signed)
History of retinal tear, superotemporal and superonasal good laser retinopexy no new breaks

## 2021-07-25 NOTE — Assessment & Plan Note (Signed)
OD no signs of progression of severe NPDR will observe

## 2021-07-25 NOTE — Assessment & Plan Note (Signed)
OS currently 5 months post vitrectomy PRP for vitreous hemorrhage secondary to proliferative diabetic retinopathy

## 2021-07-25 NOTE — Assessment & Plan Note (Signed)
Quiescent PDR OS and improving CSME with residual visual loss on the basis of foveal atrophy

## 2021-08-29 DIAGNOSIS — E1159 Type 2 diabetes mellitus with other circulatory complications: Secondary | ICD-10-CM | POA: Diagnosis not present

## 2021-08-29 DIAGNOSIS — I129 Hypertensive chronic kidney disease with stage 1 through stage 4 chronic kidney disease, or unspecified chronic kidney disease: Secondary | ICD-10-CM | POA: Diagnosis not present

## 2021-08-29 DIAGNOSIS — E785 Hyperlipidemia, unspecified: Secondary | ICD-10-CM | POA: Diagnosis not present

## 2021-08-29 DIAGNOSIS — H3500 Unspecified background retinopathy: Secondary | ICD-10-CM | POA: Diagnosis not present

## 2021-10-02 DIAGNOSIS — C44629 Squamous cell carcinoma of skin of left upper limb, including shoulder: Secondary | ICD-10-CM | POA: Diagnosis not present

## 2021-10-02 DIAGNOSIS — L821 Other seborrheic keratosis: Secondary | ICD-10-CM | POA: Diagnosis not present

## 2021-10-02 DIAGNOSIS — L570 Actinic keratosis: Secondary | ICD-10-CM | POA: Diagnosis not present

## 2021-10-02 DIAGNOSIS — Z85828 Personal history of other malignant neoplasm of skin: Secondary | ICD-10-CM | POA: Diagnosis not present

## 2021-10-02 DIAGNOSIS — Z85820 Personal history of malignant melanoma of skin: Secondary | ICD-10-CM | POA: Diagnosis not present

## 2021-10-07 ENCOUNTER — Emergency Department (HOSPITAL_COMMUNITY): Payer: Medicare Other

## 2021-10-07 ENCOUNTER — Observation Stay (HOSPITAL_COMMUNITY)
Admission: EM | Admit: 2021-10-07 | Discharge: 2021-10-08 | Disposition: A | Discharge status: Home Health | Payer: Medicare Other | Attending: Family Medicine | Admitting: Family Medicine

## 2021-10-07 ENCOUNTER — Other Ambulatory Visit: Payer: Self-pay

## 2021-10-07 DIAGNOSIS — Z7982 Long term (current) use of aspirin: Secondary | ICD-10-CM | POA: Insufficient documentation

## 2021-10-07 DIAGNOSIS — S0990XA Unspecified injury of head, initial encounter: Secondary | ICD-10-CM | POA: Diagnosis not present

## 2021-10-07 DIAGNOSIS — R2689 Other abnormalities of gait and mobility: Secondary | ICD-10-CM | POA: Insufficient documentation

## 2021-10-07 DIAGNOSIS — Y92410 Unspecified street and highway as the place of occurrence of the external cause: Secondary | ICD-10-CM | POA: Insufficient documentation

## 2021-10-07 DIAGNOSIS — R52 Pain, unspecified: Secondary | ICD-10-CM | POA: Diagnosis not present

## 2021-10-07 DIAGNOSIS — S129XXA Fracture of neck, unspecified, initial encounter: Principal | ICD-10-CM | POA: Diagnosis present

## 2021-10-07 DIAGNOSIS — N400 Enlarged prostate without lower urinary tract symptoms: Secondary | ICD-10-CM | POA: Diagnosis not present

## 2021-10-07 DIAGNOSIS — I129 Hypertensive chronic kidney disease with stage 1 through stage 4 chronic kidney disease, or unspecified chronic kidney disease: Secondary | ICD-10-CM | POA: Diagnosis not present

## 2021-10-07 DIAGNOSIS — M79642 Pain in left hand: Secondary | ICD-10-CM | POA: Diagnosis not present

## 2021-10-07 DIAGNOSIS — Z7984 Long term (current) use of oral hypoglycemic drugs: Secondary | ICD-10-CM | POA: Diagnosis not present

## 2021-10-07 DIAGNOSIS — S3993XA Unspecified injury of pelvis, initial encounter: Secondary | ICD-10-CM | POA: Diagnosis not present

## 2021-10-07 DIAGNOSIS — Z85828 Personal history of other malignant neoplasm of skin: Secondary | ICD-10-CM | POA: Insufficient documentation

## 2021-10-07 DIAGNOSIS — N189 Chronic kidney disease, unspecified: Secondary | ICD-10-CM | POA: Diagnosis not present

## 2021-10-07 DIAGNOSIS — S12600A Unspecified displaced fracture of seventh cervical vertebra, initial encounter for closed fracture: Secondary | ICD-10-CM | POA: Diagnosis not present

## 2021-10-07 DIAGNOSIS — Z79899 Other long term (current) drug therapy: Secondary | ICD-10-CM | POA: Insufficient documentation

## 2021-10-07 DIAGNOSIS — I2510 Atherosclerotic heart disease of native coronary artery without angina pectoris: Secondary | ICD-10-CM | POA: Diagnosis not present

## 2021-10-07 DIAGNOSIS — I10 Essential (primary) hypertension: Secondary | ICD-10-CM | POA: Diagnosis present

## 2021-10-07 DIAGNOSIS — S12100A Unspecified displaced fracture of second cervical vertebra, initial encounter for closed fracture: Secondary | ICD-10-CM | POA: Diagnosis not present

## 2021-10-07 DIAGNOSIS — Z20822 Contact with and (suspected) exposure to covid-19: Secondary | ICD-10-CM | POA: Diagnosis not present

## 2021-10-07 DIAGNOSIS — Z7901 Long term (current) use of anticoagulants: Secondary | ICD-10-CM | POA: Insufficient documentation

## 2021-10-07 DIAGNOSIS — E1122 Type 2 diabetes mellitus with diabetic chronic kidney disease: Secondary | ICD-10-CM | POA: Insufficient documentation

## 2021-10-07 DIAGNOSIS — I517 Cardiomegaly: Secondary | ICD-10-CM | POA: Diagnosis not present

## 2021-10-07 DIAGNOSIS — M79661 Pain in right lower leg: Secondary | ICD-10-CM | POA: Diagnosis not present

## 2021-10-07 DIAGNOSIS — R911 Solitary pulmonary nodule: Secondary | ICD-10-CM | POA: Diagnosis not present

## 2021-10-07 DIAGNOSIS — E11319 Type 2 diabetes mellitus with unspecified diabetic retinopathy without macular edema: Secondary | ICD-10-CM | POA: Diagnosis present

## 2021-10-07 DIAGNOSIS — S199XXA Unspecified injury of neck, initial encounter: Secondary | ICD-10-CM | POA: Diagnosis present

## 2021-10-07 DIAGNOSIS — S299XXA Unspecified injury of thorax, initial encounter: Secondary | ICD-10-CM | POA: Diagnosis not present

## 2021-10-07 DIAGNOSIS — Z8673 Personal history of transient ischemic attack (TIA), and cerebral infarction without residual deficits: Secondary | ICD-10-CM

## 2021-10-07 DIAGNOSIS — S0003XA Contusion of scalp, initial encounter: Secondary | ICD-10-CM | POA: Diagnosis not present

## 2021-10-07 DIAGNOSIS — J479 Bronchiectasis, uncomplicated: Secondary | ICD-10-CM | POA: Diagnosis not present

## 2021-10-07 DIAGNOSIS — R9431 Abnormal electrocardiogram [ECG] [EKG]: Secondary | ICD-10-CM | POA: Diagnosis not present

## 2021-10-07 DIAGNOSIS — T1490XA Injury, unspecified, initial encounter: Secondary | ICD-10-CM

## 2021-10-07 DIAGNOSIS — J811 Chronic pulmonary edema: Secondary | ICD-10-CM | POA: Diagnosis not present

## 2021-10-07 DIAGNOSIS — J9811 Atelectasis: Secondary | ICD-10-CM | POA: Diagnosis not present

## 2021-10-07 LAB — COMPREHENSIVE METABOLIC PANEL
ALT: 18 U/L (ref 0–44)
AST: 21 U/L (ref 15–41)
Albumin: 4 g/dL (ref 3.5–5.0)
Alkaline Phosphatase: 93 U/L (ref 38–126)
Anion gap: 9 (ref 5–15)
BUN: 25 mg/dL — ABNORMAL HIGH (ref 8–23)
CO2: 25 mmol/L (ref 22–32)
Calcium: 9.1 mg/dL (ref 8.9–10.3)
Chloride: 107 mmol/L (ref 98–111)
Creatinine, Ser: 1.4 mg/dL — ABNORMAL HIGH (ref 0.61–1.24)
GFR, Estimated: 52 mL/min — ABNORMAL LOW (ref >60)
Glucose, Bld: 150 mg/dL — ABNORMAL HIGH (ref 70–99)
Potassium: 4 mmol/L (ref 3.5–5.1)
Sodium: 141 mmol/L (ref 135–145)
Total Bilirubin: 0.7 mg/dL (ref 0.3–1.2)
Total Protein: 6.7 g/dL (ref 6.5–8.1)

## 2021-10-07 LAB — PROTIME-INR
INR: 1 (ref 0.8–1.2)
Prothrombin Time: 13.2 seconds (ref 11.4–15.2)

## 2021-10-07 LAB — SAMPLE TO BLOOD BANK

## 2021-10-07 LAB — I-STAT CHEM 8, ED
BUN: 27 mg/dL — ABNORMAL HIGH (ref 8–23)
Calcium, Ion: 1.05 mmol/L — ABNORMAL LOW (ref 1.15–1.40)
Chloride: 108 mmol/L (ref 98–111)
Creatinine, Ser: 1.3 mg/dL — ABNORMAL HIGH (ref 0.61–1.24)
Glucose, Bld: 146 mg/dL — ABNORMAL HIGH (ref 70–99)
HCT: 46 % (ref 39.0–52.0)
Hemoglobin: 15.6 g/dL (ref 13.0–17.0)
Potassium: 4 mmol/L (ref 3.5–5.1)
Sodium: 140 mmol/L (ref 135–145)
TCO2: 24 mmol/L (ref 22–32)

## 2021-10-07 LAB — RESP PANEL BY RT-PCR (FLU A&B, COVID) ARPGX2
Influenza A by PCR: NEGATIVE
Influenza B by PCR: NEGATIVE
SARS Coronavirus 2 by RT PCR: NEGATIVE

## 2021-10-07 LAB — CBC
HCT: 47.1 % (ref 39.0–52.0)
Hemoglobin: 15.5 g/dL (ref 13.0–17.0)
MCH: 28.7 pg (ref 26.0–34.0)
MCHC: 32.9 g/dL (ref 30.0–36.0)
MCV: 87.1 fL (ref 80.0–100.0)
Platelets: 186 10*3/uL (ref 150–400)
RBC: 5.41 MIL/uL (ref 4.22–5.81)
RDW: 14.1 % (ref 11.5–15.5)
WBC: 8.8 10*3/uL (ref 4.0–10.5)
nRBC: 0 % (ref 0.0–0.2)

## 2021-10-07 LAB — LACTIC ACID, PLASMA: Lactic Acid, Venous: 1.1 mmol/L (ref 0.5–1.9)

## 2021-10-07 LAB — ETHANOL: Alcohol, Ethyl (B): <10 mg/dL (ref <10)

## 2021-10-07 MED ORDER — MORPHINE SULFATE (PF) 4 MG/ML IV SOLN
4.0000 mg | Freq: Once | INTRAVENOUS | Status: AC | PRN
Start: 2021-10-07 — End: 2021-10-07
  Administered 2021-10-07: 4 mg via INTRAVENOUS
  Filled 2021-10-07: qty 1

## 2021-10-07 MED ORDER — IOHEXOL 300 MG/ML  SOLN
100.0000 mL | Freq: Once | INTRAMUSCULAR | Status: AC | PRN
  Administered 2021-10-07: 100 mL via INTRAVENOUS

## 2021-10-07 MED ORDER — ONDANSETRON HCL 4 MG/2ML IJ SOLN
4.0000 mg | Freq: Four times a day (QID) | INTRAMUSCULAR | Status: AC | PRN
  Administered 2021-10-07: 4 mg via INTRAVENOUS
  Filled 2021-10-07: qty 2

## 2021-10-07 MED ORDER — ACETAMINOPHEN 325 MG PO TABS: 650.0000 mg | ORAL_TABLET | Freq: Four times a day (QID) | ORAL | Status: DC | PRN
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
## 2021-10-07 NOTE — Progress Notes (Signed)
CT C-spine reviewed, shows a C1 jefferson with type 2 dens fracture, good maintenance of alignment without angulation, pt reportedly neurologically at his baseline. Can follow up with me in clinic, needs to wear a rigid cervical collar at all times.
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
## 2021-10-07 NOTE — Progress Notes (Signed)
Orthopedic Tech Progress Note Patient Details:  James Blevins 1945-11-14 [id number]  Patient ID: [name], male   DOB: 1946/05/01, 76 y.o.   MRN: [id number] I attended trauma page. Karolee Stamps 10/07/2021, 9:15 PM

## 2021-10-07 NOTE — ED Notes (Signed)
Pt ambulated in room with assistance. Unsteady gait while ambulating was noted. Pt stating feeling dizzy having CP and Neck pain. Pt looking down while ambulating and was educated to look forward while ambulating. Pt continued to have an unsteady gait and looking down while ambulating. Mcilwain, MD has been notified.

## 2021-10-08 ENCOUNTER — Encounter (HOSPITAL_COMMUNITY): Payer: Self-pay | Admitting: Internal Medicine

## 2021-10-08 DIAGNOSIS — Z8673 Personal history of transient ischemic attack (TIA), and cerebral infarction without residual deficits: Secondary | ICD-10-CM

## 2021-10-08 DIAGNOSIS — S129XXA Fracture of neck, unspecified, initial encounter: Secondary | ICD-10-CM

## 2021-10-08 DIAGNOSIS — I10 Essential (primary) hypertension: Secondary | ICD-10-CM

## 2021-10-08 DIAGNOSIS — E113513 Type 2 diabetes mellitus with proliferative diabetic retinopathy with macular edema, bilateral: Secondary | ICD-10-CM | POA: Diagnosis not present

## 2021-10-08 DIAGNOSIS — S0990XA Unspecified injury of head, initial encounter: Secondary | ICD-10-CM | POA: Diagnosis not present

## 2021-10-08 LAB — CBC
HCT: 43.2 % (ref 39.0–52.0)
Hemoglobin: 13.8 g/dL (ref 13.0–17.0)
MCH: 27.9 pg (ref 26.0–34.0)
MCHC: 31.9 g/dL (ref 30.0–36.0)
MCV: 87.4 fL (ref 80.0–100.0)
Platelets: 177 10*3/uL (ref 150–400)
RBC: 4.94 MIL/uL (ref 4.22–5.81)
RDW: 14.3 % (ref 11.5–15.5)
WBC: 7.1 10*3/uL (ref 4.0–10.5)
nRBC: 0 % (ref 0.0–0.2)

## 2021-10-08 LAB — BASIC METABOLIC PANEL
Anion gap: 8 (ref 5–15)
BUN: 23 mg/dL (ref 8–23)
CO2: 22 mmol/L (ref 22–32)
Calcium: 8.4 mg/dL — ABNORMAL LOW (ref 8.9–10.3)
Chloride: 108 mmol/L (ref 98–111)
Creatinine, Ser: 1.08 mg/dL (ref 0.61–1.24)
GFR, Estimated: >60 mL/min (ref >60)
Glucose, Bld: 214 mg/dL — ABNORMAL HIGH (ref 70–99)
Potassium: 4.1 mmol/L (ref 3.5–5.1)
Sodium: 138 mmol/L (ref 135–145)

## 2021-10-08 MED ORDER — ATORVASTATIN CALCIUM 40 MG PO TABS
80.0000 mg | ORAL_TABLET | Freq: Every day | ORAL | Status: DC
  Administered 2021-10-08: 80 mg via ORAL
  Filled 2021-10-08: qty 1

## 2021-10-08 MED ORDER — ASPIRIN 325 MG PO TABS
325.0000 mg | ORAL_TABLET | Freq: Every day | ORAL | Status: DC
  Administered 2021-10-08: 325 mg via ORAL
  Filled 2021-10-08: qty 1

## 2021-10-08 MED ORDER — POLYETHYLENE GLYCOL 3350 17 G PO PACK
17.0000 g | PACK | Freq: Every day | ORAL | Status: DC | PRN
Start: 2021-10-08 — End: 2021-10-09

## 2021-10-08 MED ORDER — HYDROCODONE-ACETAMINOPHEN 5-325 MG PO TABS: 1.0000 | ORAL_TABLET | Freq: Four times a day (QID) | ORAL | 0 refills | Status: AC | PRN

## 2021-10-08 MED ORDER — SODIUM CHLORIDE 0.9% FLUSH: 3.0000 mL | Freq: Two times a day (BID) | INTRAVENOUS | Status: DC

## 2021-10-08 MED ORDER — HYDROCODONE-ACETAMINOPHEN 5-325 MG PO TABS: 1.0000 | ORAL_TABLET | Freq: Four times a day (QID) | ORAL | Status: DC | PRN

## 2021-10-08 MED ORDER — CLOPIDOGREL BISULFATE 75 MG PO TABS
75.0000 mg | ORAL_TABLET | Freq: Every day | ORAL | Status: DC
  Administered 2021-10-08: 75 mg via ORAL
  Filled 2021-10-08: qty 1

## 2021-10-08 MED ORDER — ACETAMINOPHEN 325 MG PO TABS: 650.0000 mg | ORAL_TABLET | Freq: Four times a day (QID) | ORAL | Status: DC | PRN

## 2021-10-08 MED ORDER — ENOXAPARIN SODIUM 40 MG/0.4ML IJ SOSY: 40.0000 mg | PREFILLED_SYRINGE | INTRAMUSCULAR | Status: DC

## 2021-10-08 MED ORDER — ACETAMINOPHEN 650 MG RE SUPP: 650.0000 mg | Freq: Four times a day (QID) | RECTAL | Status: DC | PRN

## 2021-10-08 MED ORDER — SODIUM CHLORIDE 0.9 % IV SOLN: INTRAVENOUS | Status: AC

## 2021-10-08 NOTE — Progress Notes (Incomplete)
Trauma Response Nurse Documentation   James Blevins is a 76 y.o. male arriving to [facility] ED via {Trauma ED/EMS:26864}  On {meds; anticoagulants:31417}. Trauma was activated as a {Trauma Level:26868} by *** based on the following trauma criteria {Trauma criteria:26865}. Trauma team at the bedside on patient arrival. Patient cleared for CT by Dr. [name] Patient to CT with team. GCS ***.  History   Past Medical History:  Diagnosis Date   Acute endophthalmitis of left eye 02/18/2020   Cataracts, bilateral    Diabetes ([place])    Hypopyon of left eye 02/18/2020   Retinal detachment left eye, 08/2013   Skin cancer    Stroke [facility])    Stroke due to embolism ([place]) 01/14/2016     Past Surgical History:  Procedure Laterality Date   COLONOSCOPY  2002   EYE SURGERY     PARS PLANA VITRECTOMY Left 02/18/2020   Procedure: PARS PLANA VITRECTOMY 25 GAUGE FOR ENDOPHTHALMITIS, ENDOPTHALMITIS PROTOCOL WITH ANTIBIOTIC INJECTION;  Surgeon: [name], MD;  Location: [place];  Service: Ophthalmology;  Laterality: Left;   skin cancer removal     TONSILLECTOMY  childhood       Initial Focused Assessment (If applicable, or please see trauma documentation):   CT's Completed:   {Trauma CT:26866}   Interventions:   Plan for disposition:  {Trauma Dispo:26867}   Consults completed:  {Trauma Consults:26862} at ***.  Event Summary:  MTP Summary (If applicable):   Bedside handoff with {Trauma handoff:26863::"ED RN"} ***.    James Blevins, [place]  Trauma Response RN  Please call TRN at [phone] for further assistance.
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
## 2021-10-08 NOTE — ED Notes (Signed)
PT at bedside.

## 2021-10-08 NOTE — ED Notes (Signed)
Pt attempted to sign for discharge but someone was in his chart.  Additional [place] J collar given to Pt/family per OT request.

## 2021-10-08 NOTE — ED Notes (Signed)
Per TOC, they can follow up with Pt after discharge.

## 2021-10-08 NOTE — ED Notes (Signed)
Breakfast orders placed 

## 2021-10-08 NOTE — TOC CAGE-AID Note (Signed)
Transition of Care [facility]) - CAGE-AID Screening   Patient Details  Name: James Blevins MRN: [id number] Date of Birth: Oct 24, 1945  Transition of Care [facility]) CM/SW Contact:    Gaetano Hawthorne Tarpley-Carter, [place] Phone Number: 10/08/2021, 12:28 PM   Clinical Narrative: Pt participated in [place].  Pt stated he does not use substance or ETOH.  Pt was not offered resources, due to no usage of substance or ETOH.    Satia Winger Tarpley-Carter, MSW, LCSW-A Pronouns:  She/Her/Hers [place] Transitions of Care Clinical Social Worker Direct Number:  [phone] [email or web address]   CAGE-AID Screening:      Have People Annoyed You By Critizing Your Drinking Or Drug Use?: No Have You Felt Bad Or Guilty About Your Drinking Or Drug Use?: No      Substance Abuse Education Offered: No

## 2021-10-08 NOTE — Discharge Summary (Signed)
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 for treatment. Nasal washings and aspirates are unacceptable for Xpert Xpress SARS-CoV-2/FLU/RSV testing.  Fact Sheet for Patients: EntrepreneurPulse.com.au  Fact Sheet for Healthcare Providers: IncredibleEmployment.be  This test is not yet approved or cleared by the [place] FDA and has been authorized for detection and/or diagnosis of SARS-CoV-2 by FDA under an Emergency Use Authorization (EUA). This EUA will remain in effect (meaning this test can be used) for the duration of the COVID-19 declaration under Section 564(b)(1) of the Act, 21 U.S.C. section 360bbb-3(b)(1), unless the authorization is terminated or revoked.  Performed at [facility], [place] [address], [place], [place] [address]      Time  coordinating discharge: Over 30 minutes  SIGNED:   Darliss Cheney, MD  [facility] 10/08/2021, 11:58 AM  If 7PM-7AM, please contact night-coverage [email or web address]

## 2021-10-18 DIAGNOSIS — S129XXA Fracture of neck, unspecified, initial encounter: Secondary | ICD-10-CM | POA: Diagnosis not present

## 2021-10-18 DIAGNOSIS — Z6835 Body mass index (BMI) 35.0-35.9, adult: Secondary | ICD-10-CM | POA: Diagnosis not present

## 2021-11-15 DIAGNOSIS — Z87442 Personal history of urinary calculi: Secondary | ICD-10-CM | POA: Diagnosis not present

## 2021-11-15 DIAGNOSIS — N400 Enlarged prostate without lower urinary tract symptoms: Secondary | ICD-10-CM | POA: Diagnosis not present

## 2021-11-29 DIAGNOSIS — Z6829 Body mass index (BMI) 29.0-29.9, adult: Secondary | ICD-10-CM | POA: Diagnosis not present

## 2021-11-29 DIAGNOSIS — S129XXA Fracture of neck, unspecified, initial encounter: Secondary | ICD-10-CM | POA: Diagnosis not present

## 2021-12-30 DIAGNOSIS — E785 Hyperlipidemia, unspecified: Secondary | ICD-10-CM | POA: Diagnosis not present

## 2021-12-30 DIAGNOSIS — H3500 Unspecified background retinopathy: Secondary | ICD-10-CM | POA: Diagnosis not present

## 2021-12-30 DIAGNOSIS — I129 Hypertensive chronic kidney disease with stage 1 through stage 4 chronic kidney disease, or unspecified chronic kidney disease: Secondary | ICD-10-CM | POA: Diagnosis not present

## 2021-12-30 DIAGNOSIS — I639 Cerebral infarction, unspecified: Secondary | ICD-10-CM | POA: Diagnosis not present

## 2022-01-23 ENCOUNTER — Encounter (INDEPENDENT_AMBULATORY_CARE_PROVIDER_SITE_OTHER): Payer: Medicare Other | Admitting: Ophthalmology

## 2022-01-28 ENCOUNTER — Encounter (INDEPENDENT_AMBULATORY_CARE_PROVIDER_SITE_OTHER): Payer: Medicare Other | Admitting: Ophthalmology

## 2022-01-30 ENCOUNTER — Encounter (INDEPENDENT_AMBULATORY_CARE_PROVIDER_SITE_OTHER): Payer: Medicare Other | Admitting: Ophthalmology

## 2022-01-30 ENCOUNTER — Ambulatory Visit (INDEPENDENT_AMBULATORY_CARE_PROVIDER_SITE_OTHER): Payer: Medicare Other | Admitting: Ophthalmology

## 2022-01-30 ENCOUNTER — Encounter (INDEPENDENT_AMBULATORY_CARE_PROVIDER_SITE_OTHER): Payer: Self-pay | Admitting: Ophthalmology

## 2022-01-30 DIAGNOSIS — E113411 Type 2 diabetes mellitus with severe nonproliferative diabetic retinopathy with macular edema, right eye: Secondary | ICD-10-CM | POA: Diagnosis not present

## 2022-01-30 DIAGNOSIS — H4312 Vitreous hemorrhage, left eye: Secondary | ICD-10-CM

## 2022-01-30 DIAGNOSIS — E113512 Type 2 diabetes mellitus with proliferative diabetic retinopathy with macular edema, left eye: Secondary | ICD-10-CM

## 2022-01-30 NOTE — Assessment & Plan Note (Signed)
The nature of severe nonproliferative diabetic retinopathy discussed with the patient as well as the need for more frequent follow up and likely progression to proliferative disease in the near future. The options of continued observation versus panretinal photocoagulation at this time were reviewed as well as the risks, benefits, and alternatives. More recent option includes the use of ocular injectable medications to slow progression of retinal disease. Tight control of glucose, blood pressure, and serum lipid levels were recommended under the direction of general physician or endocrinologist, as well as avoidance of smoking and maintenance of normal body weight. The 2-year risk of progression to proliferative diabetic retinopathy is 60%.  With hemoglobin A1c of 8.3, high risk for progression to severe PDR from current severe NPDR.  60% risk in fact.  For this reason we will consider peripheral PRP anterior retina nasally initially in the future so as to prevent progression to PDR in this monocular patient
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
## 2022-01-30 NOTE — Assessment & Plan Note (Signed)
CSME OS is continued to slowly improve over time atrophy limits acuity

## 2022-02-27 ENCOUNTER — Encounter (INDEPENDENT_AMBULATORY_CARE_PROVIDER_SITE_OTHER): Payer: Self-pay | Admitting: Ophthalmology

## 2022-02-27 ENCOUNTER — Ambulatory Visit (INDEPENDENT_AMBULATORY_CARE_PROVIDER_SITE_OTHER): Payer: Medicare Other | Admitting: Ophthalmology

## 2022-02-27 DIAGNOSIS — E113552 Type 2 diabetes mellitus with stable proliferative diabetic retinopathy, left eye: Secondary | ICD-10-CM

## 2022-02-27 DIAGNOSIS — Z8669 Personal history of other diseases of the nervous system and sense organs: Secondary | ICD-10-CM | POA: Diagnosis not present

## 2022-02-27 DIAGNOSIS — E113411 Type 2 diabetes mellitus with severe nonproliferative diabetic retinopathy with macular edema, right eye: Secondary | ICD-10-CM | POA: Diagnosis not present

## 2022-02-27 DIAGNOSIS — H34232 Retinal artery branch occlusion, left eye: Secondary | ICD-10-CM

## 2022-02-27 DIAGNOSIS — H4312 Vitreous hemorrhage, left eye: Secondary | ICD-10-CM

## 2022-02-27 NOTE — Assessment & Plan Note (Signed)
Stable OS 

## 2022-02-27 NOTE — Assessment & Plan Note (Signed)
Severe NPDR.  Report of ET DRS #9, will treat with moderate scatter PRP to prevent rapid progression to PDR in this monocular patient is future should ambulation and ambulatory care be inaccessible

## 2022-02-27 NOTE — Assessment & Plan Note (Signed)
Quiescent PDR OS 

## 2022-02-27 NOTE — Assessment & Plan Note (Signed)
Impact on acuity in the past

## 2022-02-27 NOTE — Assessment & Plan Note (Signed)
1 year later condition cleared OS

## 2022-03-25 DIAGNOSIS — L720 Epidermal cyst: Secondary | ICD-10-CM | POA: Diagnosis not present

## 2022-03-25 DIAGNOSIS — D485 Neoplasm of uncertain behavior of skin: Secondary | ICD-10-CM | POA: Diagnosis not present

## 2022-03-25 DIAGNOSIS — Z85828 Personal history of other malignant neoplasm of skin: Secondary | ICD-10-CM | POA: Diagnosis not present

## 2022-03-25 DIAGNOSIS — Z85820 Personal history of malignant melanoma of skin: Secondary | ICD-10-CM | POA: Diagnosis not present

## 2022-03-25 DIAGNOSIS — L821 Other seborrheic keratosis: Secondary | ICD-10-CM | POA: Diagnosis not present

## 2022-03-25 DIAGNOSIS — C44529 Squamous cell carcinoma of skin of other part of trunk: Secondary | ICD-10-CM | POA: Diagnosis not present

## 2022-03-25 DIAGNOSIS — L570 Actinic keratosis: Secondary | ICD-10-CM | POA: Diagnosis not present

## 2022-05-07 DIAGNOSIS — E113413 Type 2 diabetes mellitus with severe nonproliferative diabetic retinopathy with macular edema, bilateral: Secondary | ICD-10-CM | POA: Diagnosis not present

## 2022-05-07 DIAGNOSIS — H40023 Open angle with borderline findings, high risk, bilateral: Secondary | ICD-10-CM | POA: Diagnosis not present

## 2022-05-07 DIAGNOSIS — Z961 Presence of intraocular lens: Secondary | ICD-10-CM | POA: Diagnosis not present

## 2022-05-07 DIAGNOSIS — H04123 Dry eye syndrome of bilateral lacrimal glands: Secondary | ICD-10-CM | POA: Diagnosis not present

## 2022-05-16 DIAGNOSIS — R7989 Other specified abnormal findings of blood chemistry: Secondary | ICD-10-CM | POA: Diagnosis not present

## 2022-05-16 DIAGNOSIS — E1159 Type 2 diabetes mellitus with other circulatory complications: Secondary | ICD-10-CM | POA: Diagnosis not present

## 2022-05-16 DIAGNOSIS — Z125 Encounter for screening for malignant neoplasm of prostate: Secondary | ICD-10-CM | POA: Diagnosis not present

## 2022-05-16 DIAGNOSIS — Z0000 Encounter for general adult medical examination without abnormal findings: Secondary | ICD-10-CM | POA: Diagnosis not present

## 2022-05-16 DIAGNOSIS — D510 Vitamin B12 deficiency anemia due to intrinsic factor deficiency: Secondary | ICD-10-CM | POA: Diagnosis not present

## 2022-05-16 DIAGNOSIS — E785 Hyperlipidemia, unspecified: Secondary | ICD-10-CM | POA: Diagnosis not present

## 2022-05-21 DIAGNOSIS — R82998 Other abnormal findings in urine: Secondary | ICD-10-CM | POA: Diagnosis not present

## 2022-05-22 ENCOUNTER — Ambulatory Visit: Payer: Medicare Other | Admitting: Podiatry

## 2022-05-22 DIAGNOSIS — L600 Ingrowing nail: Secondary | ICD-10-CM

## 2022-05-22 DIAGNOSIS — M79675 Pain in left toe(s): Secondary | ICD-10-CM | POA: Diagnosis not present

## 2022-05-22 DIAGNOSIS — M79674 Pain in right toe(s): Secondary | ICD-10-CM

## 2022-05-22 DIAGNOSIS — E113513 Type 2 diabetes mellitus with proliferative diabetic retinopathy with macular edema, bilateral: Secondary | ICD-10-CM | POA: Diagnosis not present

## 2022-05-22 DIAGNOSIS — B351 Tinea unguium: Secondary | ICD-10-CM | POA: Diagnosis not present

## 2022-05-22 NOTE — Patient Instructions (Signed)
It was nice to meet you today. If you have any questions or any further concerns, please feel fee to give me a call. You can call our office at [phone] or please feel fee to send me a message through [place].

## 2022-05-22 NOTE — Progress Notes (Signed)
Subjective:  Patient ID: James Blevins, male    DOB: 1945-12-12,  MRN: [id number]  Chief Complaint  Patient presents with   foot care    Patient is here for diabetic foot care.patient ha callous on 3rd toe left foot.    76 y.o. male presents with the above complaint. History confirmed with patient.  Patient presents with complaint primarily of elongated nails that are causing him some pain.  The hallux nails are the worst as they are also very discolored and thickened.  He reports that he has pulled off his great toenails multiple times in the past after they have become damaged from his participation in tennis.  He reports that he is not able to trim any of his nails due to limited vision and his wife does not want to trim them either.  He also reports a history of diabetes and is concerned that if he were to cut his toe he would end up with an infection that would result in a amputation.  He also reports that on the third toe on his left foot he had a small wound developed after his fourth toenail pressing to the third toe.  He states it has since scabbed up and he has been treating it with antibiotic ointment daily.  Objective:  Physical Exam: warm, good capillary refill, nail exam onychomycosis of the great toenails and dystrophic hallux nails with significant thickness discoloration.  Additionally there is a mild ingrown and elongated border of the medial aspect of the fourth toe that is pressing into the side of the third toe.  2 mm eschar present at the lateral aspect of the third toe no open wound erythema or drainage.  No erythema of the fourth toe on the left foot DP pulses palpable, PT pulses palpable, and protective sensation intact Left Foot: normal exam, no swelling, tenderness, instability; ligaments intact, full range of motion of all ankle/foot joints  Right Foot: normal exam, no swelling, tenderness, instability; ligaments intact, full range of motion of all ankle/foot joints    No images are attached to the encounter.  Assessment:   1. Pain due to onychomycosis of toenails of both feet   2. Ingrown nail of fourth toe of left foot   3. Tinea unguium   4. Type 2 diabetes mellitus with both eyes affected by proliferative retinopathy and macular edema, without long-term current use of insulin ([place])      Plan:  Patient was evaluated and treated and all questions answered.  Onychomycosis with pain  -Nails palliatively debrided as below. -Educated on self-care  #Ingrown been elongated fourth toenail medial border #Third toe lateral eschar -Performed aggressive slant back procedure to remove the sharp edge of the fourth toenail that was staying into the fourth toe as well as the third toe. -There is no sign of infection or open wound on the third toe.  I debrided the eschar present at the lateral third toe no underlying wound.  Patient can continue to monitor does not need to apply ointment anymore.  #Tinea unguium of bilateral hallux nail -Discussed with patient and recommend continued monitoring.  Nails are not loose or causing him significant pain so therefore we will leave them for now.  I did discuss that if he was having issues with them we could remove them entirely and apply chemical to prevent the regrowth.  For now we will proceed with routine nail care on an every 91-monthbasis  Procedure: Nail Debridement Rationale: Pain Type  of Debridement: manual, sharp debridement. Instrumentation: Nail nipper, rotary burr. Number of Nails: 10  Return in about 3 months (around 08/21/2022) for [facility].         Everitt Amber, DPM [facility]

## 2022-05-23 DIAGNOSIS — H3500 Unspecified background retinopathy: Secondary | ICD-10-CM | POA: Diagnosis not present

## 2022-05-23 DIAGNOSIS — Z0000 Encounter for general adult medical examination without abnormal findings: Secondary | ICD-10-CM | POA: Diagnosis not present

## 2022-05-23 DIAGNOSIS — E1159 Type 2 diabetes mellitus with other circulatory complications: Secondary | ICD-10-CM | POA: Diagnosis not present

## 2022-05-23 DIAGNOSIS — I129 Hypertensive chronic kidney disease with stage 1 through stage 4 chronic kidney disease, or unspecified chronic kidney disease: Secondary | ICD-10-CM | POA: Diagnosis not present

## 2022-08-04 ENCOUNTER — Encounter (INDEPENDENT_AMBULATORY_CARE_PROVIDER_SITE_OTHER): Payer: Medicare Other | Admitting: Ophthalmology

## 2022-08-05 DIAGNOSIS — H31002 Unspecified chorioretinal scars, left eye: Secondary | ICD-10-CM | POA: Diagnosis not present

## 2022-08-05 DIAGNOSIS — H1132 Conjunctival hemorrhage, left eye: Secondary | ICD-10-CM | POA: Diagnosis not present

## 2022-08-05 DIAGNOSIS — H4720 Unspecified optic atrophy: Secondary | ICD-10-CM | POA: Diagnosis not present

## 2022-08-21 ENCOUNTER — Ambulatory Visit: Payer: Medicare Other | Admitting: Podiatry

## 2022-09-30 DIAGNOSIS — E11319 Type 2 diabetes mellitus with unspecified diabetic retinopathy without macular edema: Secondary | ICD-10-CM | POA: Diagnosis not present

## 2022-09-30 DIAGNOSIS — E1159 Type 2 diabetes mellitus with other circulatory complications: Secondary | ICD-10-CM | POA: Diagnosis not present

## 2022-10-03 DIAGNOSIS — E1159 Type 2 diabetes mellitus with other circulatory complications: Secondary | ICD-10-CM | POA: Diagnosis not present

## 2022-10-28 DIAGNOSIS — I129 Hypertensive chronic kidney disease with stage 1 through stage 4 chronic kidney disease, or unspecified chronic kidney disease: Secondary | ICD-10-CM | POA: Diagnosis not present

## 2022-10-28 DIAGNOSIS — E1159 Type 2 diabetes mellitus with other circulatory complications: Secondary | ICD-10-CM | POA: Diagnosis not present

## 2022-10-28 DIAGNOSIS — N1831 Chronic kidney disease, stage 3a: Secondary | ICD-10-CM | POA: Diagnosis not present

## 2022-10-28 DIAGNOSIS — E785 Hyperlipidemia, unspecified: Secondary | ICD-10-CM | POA: Diagnosis not present

## 2022-11-03 DIAGNOSIS — E1159 Type 2 diabetes mellitus with other circulatory complications: Secondary | ICD-10-CM | POA: Diagnosis not present

## 2022-11-10 DIAGNOSIS — H40013 Open angle with borderline findings, low risk, bilateral: Secondary | ICD-10-CM | POA: Diagnosis not present

## 2022-11-25 DIAGNOSIS — N1831 Chronic kidney disease, stage 3a: Secondary | ICD-10-CM | POA: Diagnosis not present

## 2022-11-25 DIAGNOSIS — E1159 Type 2 diabetes mellitus with other circulatory complications: Secondary | ICD-10-CM | POA: Diagnosis not present

## 2022-11-25 DIAGNOSIS — I129 Hypertensive chronic kidney disease with stage 1 through stage 4 chronic kidney disease, or unspecified chronic kidney disease: Secondary | ICD-10-CM | POA: Diagnosis not present

## 2022-11-25 DIAGNOSIS — E785 Hyperlipidemia, unspecified: Secondary | ICD-10-CM | POA: Diagnosis not present

## 2022-12-02 DIAGNOSIS — E1159 Type 2 diabetes mellitus with other circulatory complications: Secondary | ICD-10-CM | POA: Diagnosis not present

## 2022-12-04 DIAGNOSIS — Z85828 Personal history of other malignant neoplasm of skin: Secondary | ICD-10-CM | POA: Diagnosis not present

## 2022-12-04 DIAGNOSIS — D2261 Melanocytic nevi of right upper limb, including shoulder: Secondary | ICD-10-CM | POA: Diagnosis not present

## 2022-12-04 DIAGNOSIS — D225 Melanocytic nevi of trunk: Secondary | ICD-10-CM | POA: Diagnosis not present

## 2022-12-04 DIAGNOSIS — L570 Actinic keratosis: Secondary | ICD-10-CM | POA: Diagnosis not present

## 2022-12-04 DIAGNOSIS — Z85820 Personal history of malignant melanoma of skin: Secondary | ICD-10-CM | POA: Diagnosis not present

## 2023-01-02 DIAGNOSIS — E1159 Type 2 diabetes mellitus with other circulatory complications: Secondary | ICD-10-CM | POA: Diagnosis not present

## 2023-01-28 DIAGNOSIS — E1159 Type 2 diabetes mellitus with other circulatory complications: Secondary | ICD-10-CM | POA: Diagnosis not present

## 2023-01-28 DIAGNOSIS — I129 Hypertensive chronic kidney disease with stage 1 through stage 4 chronic kidney disease, or unspecified chronic kidney disease: Secondary | ICD-10-CM | POA: Diagnosis not present

## 2023-01-29 DIAGNOSIS — Z794 Long term (current) use of insulin: Secondary | ICD-10-CM | POA: Diagnosis not present

## 2023-02-01 DIAGNOSIS — E1159 Type 2 diabetes mellitus with other circulatory complications: Secondary | ICD-10-CM | POA: Diagnosis not present

## 2023-03-04 DIAGNOSIS — E1159 Type 2 diabetes mellitus with other circulatory complications: Secondary | ICD-10-CM | POA: Diagnosis not present

## 2023-03-09 DIAGNOSIS — E1159 Type 2 diabetes mellitus with other circulatory complications: Secondary | ICD-10-CM | POA: Diagnosis not present

## 2023-03-09 DIAGNOSIS — E1165 Type 2 diabetes mellitus with hyperglycemia: Secondary | ICD-10-CM | POA: Diagnosis not present

## 2023-04-03 DIAGNOSIS — E1159 Type 2 diabetes mellitus with other circulatory complications: Secondary | ICD-10-CM | POA: Diagnosis not present

## 2023-05-07 DIAGNOSIS — I129 Hypertensive chronic kidney disease with stage 1 through stage 4 chronic kidney disease, or unspecified chronic kidney disease: Secondary | ICD-10-CM | POA: Diagnosis not present

## 2023-05-07 DIAGNOSIS — E1159 Type 2 diabetes mellitus with other circulatory complications: Secondary | ICD-10-CM | POA: Diagnosis not present

## 2023-05-27 DIAGNOSIS — E113592 Type 2 diabetes mellitus with proliferative diabetic retinopathy without macular edema, left eye: Secondary | ICD-10-CM | POA: Diagnosis not present

## 2023-05-27 DIAGNOSIS — E113491 Type 2 diabetes mellitus with severe nonproliferative diabetic retinopathy without macular edema, right eye: Secondary | ICD-10-CM | POA: Diagnosis not present

## 2023-05-27 DIAGNOSIS — H33311 Horseshoe tear of retina without detachment, right eye: Secondary | ICD-10-CM | POA: Diagnosis not present

## 2023-05-27 DIAGNOSIS — H34232 Retinal artery branch occlusion, left eye: Secondary | ICD-10-CM | POA: Diagnosis not present

## 2023-06-08 DIAGNOSIS — L820 Inflamed seborrheic keratosis: Secondary | ICD-10-CM | POA: Diagnosis not present

## 2023-06-08 DIAGNOSIS — Z85828 Personal history of other malignant neoplasm of skin: Secondary | ICD-10-CM | POA: Diagnosis not present

## 2023-06-08 DIAGNOSIS — D0462 Carcinoma in situ of skin of left upper limb, including shoulder: Secondary | ICD-10-CM | POA: Diagnosis not present

## 2023-06-08 DIAGNOSIS — Z85820 Personal history of malignant melanoma of skin: Secondary | ICD-10-CM | POA: Diagnosis not present

## 2023-06-08 DIAGNOSIS — L570 Actinic keratosis: Secondary | ICD-10-CM | POA: Diagnosis not present

## 2023-06-08 DIAGNOSIS — L723 Sebaceous cyst: Secondary | ICD-10-CM | POA: Diagnosis not present

## 2023-06-16 DIAGNOSIS — D510 Vitamin B12 deficiency anemia due to intrinsic factor deficiency: Secondary | ICD-10-CM | POA: Diagnosis not present

## 2023-06-16 DIAGNOSIS — E1159 Type 2 diabetes mellitus with other circulatory complications: Secondary | ICD-10-CM | POA: Diagnosis not present

## 2023-06-16 DIAGNOSIS — Z1389 Encounter for screening for other disorder: Secondary | ICD-10-CM | POA: Diagnosis not present

## 2023-06-24 DIAGNOSIS — E1159 Type 2 diabetes mellitus with other circulatory complications: Secondary | ICD-10-CM | POA: Diagnosis not present

## 2023-06-24 DIAGNOSIS — R82998 Other abnormal findings in urine: Secondary | ICD-10-CM | POA: Diagnosis not present

## 2023-06-26 DIAGNOSIS — I129 Hypertensive chronic kidney disease with stage 1 through stage 4 chronic kidney disease, or unspecified chronic kidney disease: Secondary | ICD-10-CM | POA: Diagnosis not present

## 2023-06-26 DIAGNOSIS — Z1331 Encounter for screening for depression: Secondary | ICD-10-CM | POA: Diagnosis not present

## 2023-06-26 DIAGNOSIS — Z0000 Encounter for general adult medical examination without abnormal findings: Secondary | ICD-10-CM | POA: Diagnosis not present

## 2023-06-26 DIAGNOSIS — E1159 Type 2 diabetes mellitus with other circulatory complications: Secondary | ICD-10-CM | POA: Diagnosis not present

## 2023-06-26 DIAGNOSIS — Z1339 Encounter for screening examination for other mental health and behavioral disorders: Secondary | ICD-10-CM | POA: Diagnosis not present

## 2023-07-24 ENCOUNTER — Emergency Department (HOSPITAL_BASED_OUTPATIENT_CLINIC_OR_DEPARTMENT_OTHER)
Admission: EM | Admit: 2023-07-24 | Discharge: 2023-07-24 | Disposition: A | Discharge status: Home / Self Care | Payer: Medicare Other | Attending: Emergency Medicine | Admitting: Emergency Medicine

## 2023-07-24 ENCOUNTER — Emergency Department (HOSPITAL_BASED_OUTPATIENT_CLINIC_OR_DEPARTMENT_OTHER): Payer: Medicare Other

## 2023-07-24 ENCOUNTER — Other Ambulatory Visit: Payer: Self-pay

## 2023-07-24 DIAGNOSIS — E119 Type 2 diabetes mellitus without complications: Secondary | ICD-10-CM | POA: Diagnosis not present

## 2023-07-24 DIAGNOSIS — S01112A Laceration without foreign body of left eyelid and periocular area, initial encounter: Secondary | ICD-10-CM

## 2023-07-24 DIAGNOSIS — Z23 Encounter for immunization: Secondary | ICD-10-CM | POA: Insufficient documentation

## 2023-07-24 DIAGNOSIS — Z794 Long term (current) use of insulin: Secondary | ICD-10-CM | POA: Insufficient documentation

## 2023-07-24 DIAGNOSIS — Z7982 Long term (current) use of aspirin: Secondary | ICD-10-CM | POA: Insufficient documentation

## 2023-07-24 DIAGNOSIS — S0990XA Unspecified injury of head, initial encounter: Secondary | ICD-10-CM | POA: Insufficient documentation

## 2023-07-24 DIAGNOSIS — W182XXA Fall in (into) shower or empty bathtub, initial encounter: Secondary | ICD-10-CM | POA: Insufficient documentation

## 2023-07-24 DIAGNOSIS — S0993XA Unspecified injury of face, initial encounter: Secondary | ICD-10-CM | POA: Diagnosis not present

## 2023-07-24 DIAGNOSIS — R55 Syncope and collapse: Secondary | ICD-10-CM | POA: Diagnosis not present

## 2023-07-24 DIAGNOSIS — R220 Localized swelling, mass and lump, head: Secondary | ICD-10-CM | POA: Diagnosis not present

## 2023-07-24 DIAGNOSIS — I639 Cerebral infarction, unspecified: Secondary | ICD-10-CM | POA: Diagnosis not present

## 2023-07-24 LAB — CBC WITH DIFFERENTIAL/PLATELET
Abs Immature Granulocytes: 0.07 10*3/uL (ref 0.00–0.07)
Basophils Absolute: 0 10*3/uL (ref 0.0–0.1)
Basophils Relative: 0 %
Eosinophils Absolute: 0.1 10*3/uL (ref 0.0–0.5)
Eosinophils Relative: 1 %
HCT: 45.9 % (ref 39.0–52.0)
Hemoglobin: 14.8 g/dL (ref 13.0–17.0)
Immature Granulocytes: 1 %
Lymphocytes Relative: 7 %
Lymphs Abs: 0.9 10*3/uL (ref 0.7–4.0)
MCH: 28.2 pg (ref 26.0–34.0)
MCHC: 32.2 g/dL (ref 30.0–36.0)
MCV: 87.4 fL (ref 80.0–100.0)
Monocytes Absolute: 0.7 10*3/uL (ref 0.1–1.0)
Monocytes Relative: 6 %
Neutro Abs: 10.6 10*3/uL — ABNORMAL HIGH (ref 1.7–7.7)
Neutrophils Relative %: 85 %
Platelets: 186 10*3/uL (ref 150–400)
RBC: 5.25 MIL/uL (ref 4.22–5.81)
RDW: 14.5 % (ref 11.5–15.5)
WBC: 12.4 10*3/uL — ABNORMAL HIGH (ref 4.0–10.5)
nRBC: 0 % (ref 0.0–0.2)

## 2023-07-24 LAB — BASIC METABOLIC PANEL
Anion gap: 10 (ref 5–15)
BUN: 20 mg/dL (ref 8–23)
CO2: 27 mmol/L (ref 22–32)
Calcium: 9.8 mg/dL (ref 8.9–10.3)
Chloride: 105 mmol/L (ref 98–111)
Creatinine, Ser: 1 mg/dL (ref 0.61–1.24)
GFR, Estimated: >60 mL/min (ref >60)
Glucose, Bld: 87 mg/dL (ref 70–99)
Potassium: 4.3 mmol/L (ref 3.5–5.1)
Sodium: 142 mmol/L (ref 135–145)

## 2023-07-24 MED ORDER — LIDOCAINE-EPINEPHRINE (PF) 2 %-1:200000 IJ SOLN
10.0000 mL | Freq: Once | INTRAMUSCULAR | Status: AC
  Administered 2023-07-24: 10 mL
  Filled 2023-07-24: qty 20

## 2023-07-24 MED ORDER — TETANUS-DIPHTH-ACELL PERTUSSIS 5-2.5-18.5 LF-MCG/0.5 IM SUSY
0.5000 mL | PREFILLED_SYRINGE | Freq: Once | INTRAMUSCULAR | Status: AC
  Administered 2023-07-24: 0.5 mL via INTRAMUSCULAR
  Filled 2023-07-24: qty 0.5

## 2023-07-24 NOTE — Discharge Instructions (Addendum)
It is ok to shower but do not scrub on the wound.  You will have a lot of bruising probably for 1 month.  Your CAT scan today was normal but if you develop severe headache, vomiting, confusion return to the ER for repeat evaluation.

## 2023-07-24 NOTE — ED Provider Notes (Signed)
[place] EMERGENCY DEPARTMENT AT [facility] Provider Note   CSN: [id number] Arrival date & time: 07/24/23  1250     History  Chief Complaint  Patient presents with   Head Injury   Loss of Consciousness    James Blevins is a 77 y.o. male.  Patient is a 77 year old male with a history of diabetes, prior embolic stroke on Plavix and prior episodes of syncope who is presenting today after a syncopal event after getting out of his whirlpool tub.  He reports feeling fine this morning and before taking the bath.  As he was getting out of the bath he reports feeling very hot and then remembers waking up on the floor.  He was able to stand up and walk to the other room to a chair when his wife's came by and saw him when he was pale and diaphoretic asking for a wet cloth and had a gash over his left eye.  Patient reports that was about an hour ago.  He on the drive over here felt a little bit nauseated but reports that has resolved.  He has not had any vomiting.  He denies a headache or recent vision changes.  He has no unilateral weakness or numbness.  No speech issues.  He denies any neck pain.  No chest pain, palpitations or shortness of breath.  No recent medication changes or distal edema.  His wife and daughter present and states that he has done this before when he has been in the hot bathtub and gotten overheated.  He is not sure when his last tetanus shot was.  The history is provided by the patient, the spouse and medical records.  Head Injury Loss of Consciousness      Home Medications Prior to Admission medications   Medication Sig Start Date End Date Taking? Authorizing Provider  aspirin 325 MG tablet Take 1 tablet (325 mg total) by mouth daily. 04/14/16   Elgergawy, Leana Roe, MD  atorvastatin (LIPITOR) 80 MG tablet Take 80 mg by mouth daily.    [provider]  atropine 1 % ophthalmic solution Place 1 drop into the left eye 2 (two) times daily. 02/20/20    Rankin, Alford Highland, MD  clopidogrel (PLAVIX) 75 MG tablet Take 75 mg by mouth daily.    [provider]  cyanocobalamin (,VITAMIN B-12,) 1000 MCG/ML injection Please take once daily for next 7 days, then once weekly for 4 weeks, then once monthly. 04/14/16   Elgergawy, Leana Roe, MD  glipiZIDE (GLUCOTROL) 5 MG tablet Take 0.5 tablets (2.5 mg total) by mouth 2 (two) times daily before a meal. Please take for 2 days, then stop once or you resumed her Janumet back 04/14/16   Elgergawy, Leana Roe, MD  HYDROcodone-acetaminophen (NORCO/VICODIN) 5-325 MG tablet Take 1 tablet by mouth every 6 (six) hours as needed for moderate pain or severe pain. 10/08/21   Hughie Closs, MD  JARDIANCE 10 MG TABS tablet Take 10 mg by mouth daily. 10/03/21   [provider]  metFORMIN (GLUCOPHAGE) 1000 MG tablet Take 1,000 mg by mouth 2 (two) times daily. 07/16/21   [provider]  prednisoLONE acetate (PRED FORTE) 1 % ophthalmic suspension Place 1 drop into the left eye 4 (four) times daily. 02/18/20   Rankin, Alford Highland, MD  sitaGLIPtin-metformin (JANUMET) 50-1000 MG tablet Take 1 tablet by mouth 2 (two) times daily with a meal. Please hold for next 2 days as you have received IV contrast during  hospital stay, resume on 04/17/2016 04/17/16   Elgergawy, Leana Roe, MD  TRESIBA FLEXTOUCH 100 UNIT/ML FlexTouch Pen Inject 35 Units into the skin daily. 10/02/21   [provider]      Allergies    Patient has no known allergies.    Review of Systems   Review of Systems  Cardiovascular:  Positive for syncope.    Physical Exam Updated Vital Signs BP 134/74   Pulse 82   Temp 97.7 F (36.5 C) (Oral)   Resp 17   Ht 5\' 11"  (1.803 m)   Wt 91.2 kg   SpO2 97%   BMI 28.03 kg/m  Physical Exam Vitals and nursing note reviewed.  Constitutional:      General: He is not in acute distress.    Appearance: He is well-developed.  HENT:     Head: Normocephalic and atraumatic.   Eyes:     Conjunctiva/sclera:  Conjunctivae normal.     Pupils: Pupils are equal, round, and reactive to light.  Cardiovascular:     Rate and Rhythm: Normal rate and regular rhythm.     Pulses: Normal pulses.     Heart sounds: No murmur heard. Pulmonary:     Effort: Pulmonary effort is normal. No respiratory distress.     Breath sounds: Normal breath sounds. No wheezing or rales.  Abdominal:     General: There is no distension.     Palpations: Abdomen is soft.     Tenderness: There is no abdominal tenderness. There is no guarding or rebound.  Musculoskeletal:        General: No tenderness. Normal range of motion.     Cervical back: Normal range of motion and neck supple.     Right lower leg: No edema.     Left lower leg: No edema.  Skin:    General: Skin is warm and dry.     Findings: No erythema or rash.  Neurological:     Mental Status: He is alert and oriented to person, place, and time. Mental status is at baseline.     Cranial Nerves: No cranial nerve deficit.     Sensory: No sensory deficit.     Motor: No weakness.     Gait: Gait normal.  Psychiatric:        Behavior: Behavior normal.     ED Results / Procedures / Treatments   Labs (all labs ordered are listed, but only abnormal results are displayed) Labs Reviewed  CBC WITH DIFFERENTIAL/PLATELET - Abnormal; Notable for the following components:      Result Value   WBC 12.4 (*)    Neutro Abs 10.6 (*)    All other components within normal limits  BASIC METABOLIC PANEL    EKG EKG Interpretation Date/Time:  Friday July 24 2023 13:16:20 EST Ventricular Rate:  87 PR Interval:  161 QRS Duration:  106 QT Interval:  370 QTC Calculation: 446 R Axis:   89  Text Interpretation: Sinus rhythm Borderline right axis deviation Nonspecific T abnormalities, lateral leads No significant change since last tracing Confirmed by Gwyneth Sprout ([id number]) on 07/24/2023 1:22:18 PM  Radiology CT Head Wo Contrast  Result Date: 07/24/2023 CLINICAL DATA:   Facial trauma, blunt. Fall on blood thinners. Laceration above the left eyebrow. Loss of consciousness. EXAM: CT HEAD WITHOUT CONTRAST CT MAXILLOFACIAL WITHOUT CONTRAST TECHNIQUE: Multidetector CT imaging of the head and maxillofacial structures were performed using the standard protocol without intravenous contrast. Multiplanar CT image reconstructions of the maxillofacial structures were  also generated. RADIATION DOSE REDUCTION: This exam was performed according to the departmental dose-optimization program which includes automated exposure control, adjustment of the mA and/or kV according to patient size and/or use of iterative reconstruction technique. COMPARISON:  Head CT 10/07/2021 and MRI 06/30/2020 FINDINGS: CT HEAD FINDINGS Brain: There is no evidence of an acute infarct, intracranial hemorrhage, mass, midline shift, or extra-axial fluid collection. Small chronic bilateral occipital and left cerebellar infarcts are unchanged. Mild cerebral atrophy is within normal limits for age. Vascular: Calcified atherosclerosis at the skull base. No hyperdense vessel. Skull: No acute fracture or suspicious osseous lesion. Other: None. CT MAXILLOFACIAL FINDINGS Osseous: No acute fracture or mandibular dislocation. Chronic deformity of the left maxillary sinus. Orbits: Bilateral cataract extraction. Left scleral buckle. No intraorbital hematoma. Sinuses: Largely clear paranasal sinuses and mastoid air cells. Soft tissues: Left supraorbital soft tissue swelling. IMPRESSION: 1. No evidence of acute intracranial abnormality. 2. No acute maxillofacial fracture. 3. Left supraorbital soft tissue swelling. Electronically Signed   By: Sebastian Ache M.D.   On: 07/24/2023 13:18   CT MAXILLOFACIAL WO CONTRAST  Result Date: 07/24/2023 CLINICAL DATA:  Facial trauma, blunt. Fall on blood thinners. Laceration above the left eyebrow. Loss of consciousness. EXAM: CT HEAD WITHOUT CONTRAST CT MAXILLOFACIAL WITHOUT CONTRAST TECHNIQUE:  Multidetector CT imaging of the head and maxillofacial structures were performed using the standard protocol without intravenous contrast. Multiplanar CT image reconstructions of the maxillofacial structures were also generated. RADIATION DOSE REDUCTION: This exam was performed according to the departmental dose-optimization program which includes automated exposure control, adjustment of the mA and/or kV according to patient size and/or use of iterative reconstruction technique. COMPARISON:  Head CT 10/07/2021 and MRI 06/30/2020 FINDINGS: CT HEAD FINDINGS Brain: There is no evidence of an acute infarct, intracranial hemorrhage, mass, midline shift, or extra-axial fluid collection. Small chronic bilateral occipital and left cerebellar infarcts are unchanged. Mild cerebral atrophy is within normal limits for age. Vascular: Calcified atherosclerosis at the skull base. No hyperdense vessel. Skull: No acute fracture or suspicious osseous lesion. Other: None. CT MAXILLOFACIAL FINDINGS Osseous: No acute fracture or mandibular dislocation. Chronic deformity of the left maxillary sinus. Orbits: Bilateral cataract extraction. Left scleral buckle. No intraorbital hematoma. Sinuses: Largely clear paranasal sinuses and mastoid air cells. Soft tissues: Left supraorbital soft tissue swelling. IMPRESSION: 1. No evidence of acute intracranial abnormality. 2. No acute maxillofacial fracture. 3. Left supraorbital soft tissue swelling. Electronically Signed   By: Sebastian Ache M.D.   On: 07/24/2023 13:18    Procedures Procedures  LACERATION REPAIR Performed by: [facility] Authorized by: Gwyneth Sprout Consent: Verbal consent obtained. Risks and benefits: risks, benefits and alternatives were discussed Consent given by: patient Patient identity confirmed: provided demographic data Prepped and Draped in normal sterile fashion Wound explored  Laceration Location: left eyebrow  Laceration Length: 4cm  No Foreign  Bodies seen or palpated  Anesthesia: local infiltration  Local anesthetic: lidocaine 2% with epinephrine  Anesthetic total: 5 ml  Irrigation method: syringe Amount of cleaning: standard  Skin closure: 6.0 prolene  Number of sutures: 8  Technique: simple interrupted  Patient tolerance: Patient tolerated the procedure well with no immediate complications.    Medications Ordered in ED Medications  Tdap (BOOSTRIX) injection 0.5 mL (has no administration in time range)  lidocaine-EPINEPHrine (XYLOCAINE W/EPI) 2 %-1:200000 (PF) injection 10 mL (10 mLs Infiltration Given 07/24/23 1330)    ED Course/ Medical Decision Making/ A&P  Medical Decision Making Amount and/or Complexity of Data Reviewed Labs: ordered. Decision-making details documented in ED Course. Radiology: ordered and independent interpretation performed. Decision-making details documented in ED Course. ECG/medicine tests: ordered and independent interpretation performed. Decision-making details documented in ED Course.  Risk Prescription drug management.   Pt with multiple medical problems and comorbidities and presenting today with a complaint that caries a high risk for morbidity and mortality.  Here today after a syncopal event.  Possible vasovagal after getting overheated while in the tub.  Lower suspicion for dysrhythmia, ACS or stroke.  Patient is mentating normally here.  Patient does take Plavix and we will do an image to ensure no intracranial bleed.  Patient's wife and daughter present and report no neurologic symptoms after the event today.  I independently interpreted patient's EKG and labs.  CBC with mild leukocytosis of 12, BMP wnl.  EKG without acute findings today.  Normal QT and no findings concerning for Brugada's, WPW.  Labs to ensure no evidence of anemia or new AKI are pending.  Wound repaired as above.  Tetanus shot updated. I have independently visualized and  interpreted pt's images today.  Head CT today without acute bleed and face negative for fracture.  Radiology reports left supratentorial soft tissue swelling but no other acute findings.          Final Clinical Impression(s) / ED Diagnoses Final diagnoses:  Vasovagal syncope  Eyebrow laceration, left, initial encounter    Rx / DC Orders ED Discharge Orders     None         Gwyneth Sprout, MD 07/24/23 1501

## 2023-07-24 NOTE — ED Notes (Signed)
Suture cart at the bedside.  

## 2023-07-31 DIAGNOSIS — E1159 Type 2 diabetes mellitus with other circulatory complications: Secondary | ICD-10-CM | POA: Diagnosis not present

## 2023-07-31 DIAGNOSIS — R55 Syncope and collapse: Secondary | ICD-10-CM | POA: Diagnosis not present

## 2023-08-13 ENCOUNTER — Telehealth: Payer: Self-pay

## 2023-08-13 NOTE — Telephone Encounter (Signed)
Transition Care Management Unsuccessful Follow-up Telephone Call  Date of discharge and from where:  [facility] 11/15  Attempts:  1st Attempt  Reason for unsuccessful TCM follow-up call:  No answer/busy   Lenard Forth [place]  [facility], [facility] Guide, Phone: [phone] Website: [email or web address]

## 2023-08-14 ENCOUNTER — Telehealth: Payer: Self-pay

## 2023-08-14 NOTE — Telephone Encounter (Signed)
 Transition Care Management Unsuccessful Follow-up Telephone Call  Date of discharge and from where:  [facility] 11/15  Attempts:  2nd Attempt  Reason for unsuccessful TCM follow-up call:  No answer/busy   Lenard Forth [place]  [facility], [facility] Guide, Phone: [phone] Website: [email or web address]

## 2023-08-24 DIAGNOSIS — I129 Hypertensive chronic kidney disease with stage 1 through stage 4 chronic kidney disease, or unspecified chronic kidney disease: Secondary | ICD-10-CM | POA: Diagnosis not present

## 2023-08-24 DIAGNOSIS — E1159 Type 2 diabetes mellitus with other circulatory complications: Secondary | ICD-10-CM | POA: Diagnosis not present

## 2023-08-24 DIAGNOSIS — E039 Hypothyroidism, unspecified: Secondary | ICD-10-CM | POA: Diagnosis not present

## 2023-09-11 DIAGNOSIS — R52 Pain, unspecified: Secondary | ICD-10-CM | POA: Diagnosis not present

## 2023-09-11 DIAGNOSIS — U071 COVID-19: Secondary | ICD-10-CM | POA: Diagnosis not present

## 2023-09-11 DIAGNOSIS — R490 Dysphonia: Secondary | ICD-10-CM | POA: Diagnosis not present

## 2023-10-01 DIAGNOSIS — E1159 Type 2 diabetes mellitus with other circulatory complications: Secondary | ICD-10-CM | POA: Diagnosis not present

## 2023-10-01 DIAGNOSIS — E039 Hypothyroidism, unspecified: Secondary | ICD-10-CM | POA: Diagnosis not present

## 2023-10-27 DIAGNOSIS — I129 Hypertensive chronic kidney disease with stage 1 through stage 4 chronic kidney disease, or unspecified chronic kidney disease: Secondary | ICD-10-CM | POA: Diagnosis not present

## 2023-10-27 DIAGNOSIS — E1159 Type 2 diabetes mellitus with other circulatory complications: Secondary | ICD-10-CM | POA: Diagnosis not present

## 2023-10-29 DIAGNOSIS — D1801 Hemangioma of skin and subcutaneous tissue: Secondary | ICD-10-CM | POA: Diagnosis not present

## 2023-10-29 DIAGNOSIS — C4442 Squamous cell carcinoma of skin of scalp and neck: Secondary | ICD-10-CM | POA: Diagnosis not present

## 2023-10-29 DIAGNOSIS — D0439 Carcinoma in situ of skin of other parts of face: Secondary | ICD-10-CM | POA: Diagnosis not present

## 2023-10-29 DIAGNOSIS — Z85820 Personal history of malignant melanoma of skin: Secondary | ICD-10-CM | POA: Diagnosis not present

## 2023-10-29 DIAGNOSIS — L570 Actinic keratosis: Secondary | ICD-10-CM | POA: Diagnosis not present

## 2023-10-29 DIAGNOSIS — Z85828 Personal history of other malignant neoplasm of skin: Secondary | ICD-10-CM | POA: Diagnosis not present

## 2023-10-29 DIAGNOSIS — L820 Inflamed seborrheic keratosis: Secondary | ICD-10-CM | POA: Diagnosis not present

## 2023-10-29 DIAGNOSIS — L821 Other seborrheic keratosis: Secondary | ICD-10-CM | POA: Diagnosis not present

## 2023-10-29 DIAGNOSIS — D485 Neoplasm of uncertain behavior of skin: Secondary | ICD-10-CM | POA: Diagnosis not present

## 2023-11-09 DIAGNOSIS — D0439 Carcinoma in situ of skin of other parts of face: Secondary | ICD-10-CM | POA: Diagnosis not present

## 2023-12-07 DIAGNOSIS — R351 Nocturia: Secondary | ICD-10-CM | POA: Diagnosis not present

## 2023-12-07 DIAGNOSIS — N5201 Erectile dysfunction due to arterial insufficiency: Secondary | ICD-10-CM | POA: Diagnosis not present

## 2023-12-29 DIAGNOSIS — I129 Hypertensive chronic kidney disease with stage 1 through stage 4 chronic kidney disease, or unspecified chronic kidney disease: Secondary | ICD-10-CM | POA: Diagnosis not present

## 2023-12-29 DIAGNOSIS — E1159 Type 2 diabetes mellitus with other circulatory complications: Secondary | ICD-10-CM | POA: Diagnosis not present

## 2024-03-03 DIAGNOSIS — I129 Hypertensive chronic kidney disease with stage 1 through stage 4 chronic kidney disease, or unspecified chronic kidney disease: Secondary | ICD-10-CM | POA: Diagnosis not present

## 2024-03-03 DIAGNOSIS — E1159 Type 2 diabetes mellitus with other circulatory complications: Secondary | ICD-10-CM | POA: Diagnosis not present

## 2024-03-14 DIAGNOSIS — E113592 Type 2 diabetes mellitus with proliferative diabetic retinopathy without macular edema, left eye: Secondary | ICD-10-CM | POA: Diagnosis not present

## 2024-03-14 DIAGNOSIS — H34212 Partial retinal artery occlusion, left eye: Secondary | ICD-10-CM | POA: Diagnosis not present

## 2024-03-14 DIAGNOSIS — E113491 Type 2 diabetes mellitus with severe nonproliferative diabetic retinopathy without macular edema, right eye: Secondary | ICD-10-CM | POA: Diagnosis not present

## 2024-03-14 DIAGNOSIS — H34232 Retinal artery branch occlusion, left eye: Secondary | ICD-10-CM | POA: Diagnosis not present

## 2024-04-06 DIAGNOSIS — N5201 Erectile dysfunction due to arterial insufficiency: Secondary | ICD-10-CM | POA: Diagnosis not present

## 2024-04-15 DIAGNOSIS — E113512 Type 2 diabetes mellitus with proliferative diabetic retinopathy with macular edema, left eye: Secondary | ICD-10-CM | POA: Diagnosis not present

## 2024-04-15 DIAGNOSIS — H40023 Open angle with borderline findings, high risk, bilateral: Secondary | ICD-10-CM | POA: Diagnosis not present

## 2024-04-15 DIAGNOSIS — H04123 Dry eye syndrome of bilateral lacrimal glands: Secondary | ICD-10-CM | POA: Diagnosis not present

## 2024-04-15 DIAGNOSIS — H0100A Unspecified blepharitis right eye, upper and lower eyelids: Secondary | ICD-10-CM | POA: Diagnosis not present

## 2024-04-27 DIAGNOSIS — L821 Other seborrheic keratosis: Secondary | ICD-10-CM | POA: Diagnosis not present

## 2024-04-27 DIAGNOSIS — L905 Scar conditions and fibrosis of skin: Secondary | ICD-10-CM | POA: Diagnosis not present

## 2024-04-27 DIAGNOSIS — E1159 Type 2 diabetes mellitus with other circulatory complications: Secondary | ICD-10-CM | POA: Diagnosis not present

## 2024-04-27 DIAGNOSIS — D0439 Carcinoma in situ of skin of other parts of face: Secondary | ICD-10-CM | POA: Diagnosis not present

## 2024-04-27 DIAGNOSIS — I129 Hypertensive chronic kidney disease with stage 1 through stage 4 chronic kidney disease, or unspecified chronic kidney disease: Secondary | ICD-10-CM | POA: Diagnosis not present

## 2024-04-27 DIAGNOSIS — Z85828 Personal history of other malignant neoplasm of skin: Secondary | ICD-10-CM | POA: Diagnosis not present

## 2024-04-27 DIAGNOSIS — L570 Actinic keratosis: Secondary | ICD-10-CM | POA: Diagnosis not present

## 2024-04-27 DIAGNOSIS — Z85820 Personal history of malignant melanoma of skin: Secondary | ICD-10-CM | POA: Diagnosis not present

## 2024-04-27 DIAGNOSIS — C44622 Squamous cell carcinoma of skin of right upper limb, including shoulder: Secondary | ICD-10-CM | POA: Diagnosis not present

## 2024-06-27 DIAGNOSIS — D510 Vitamin B12 deficiency anemia due to intrinsic factor deficiency: Secondary | ICD-10-CM | POA: Diagnosis not present

## 2024-06-27 DIAGNOSIS — Z125 Encounter for screening for malignant neoplasm of prostate: Secondary | ICD-10-CM | POA: Diagnosis not present

## 2024-06-27 DIAGNOSIS — E785 Hyperlipidemia, unspecified: Secondary | ICD-10-CM | POA: Diagnosis not present

## 2024-06-27 DIAGNOSIS — E1159 Type 2 diabetes mellitus with other circulatory complications: Secondary | ICD-10-CM | POA: Diagnosis not present

## 2024-06-27 DIAGNOSIS — E039 Hypothyroidism, unspecified: Secondary | ICD-10-CM | POA: Diagnosis not present

## 2024-06-27 DIAGNOSIS — E7849 Other hyperlipidemia: Secondary | ICD-10-CM | POA: Diagnosis not present

## 2024-07-04 DIAGNOSIS — Z1331 Encounter for screening for depression: Secondary | ICD-10-CM | POA: Diagnosis not present

## 2024-07-04 DIAGNOSIS — Z0000 Encounter for general adult medical examination without abnormal findings: Secondary | ICD-10-CM | POA: Diagnosis not present

## 2024-07-04 DIAGNOSIS — E1159 Type 2 diabetes mellitus with other circulatory complications: Secondary | ICD-10-CM | POA: Diagnosis not present

## 2024-07-04 DIAGNOSIS — Z1339 Encounter for screening examination for other mental health and behavioral disorders: Secondary | ICD-10-CM | POA: Diagnosis not present

## 2024-07-04 DIAGNOSIS — R82998 Other abnormal findings in urine: Secondary | ICD-10-CM | POA: Diagnosis not present

## 2024-08-17 DIAGNOSIS — I129 Hypertensive chronic kidney disease with stage 1 through stage 4 chronic kidney disease, or unspecified chronic kidney disease: Secondary | ICD-10-CM | POA: Diagnosis not present

## 2024-08-17 DIAGNOSIS — E1159 Type 2 diabetes mellitus with other circulatory complications: Secondary | ICD-10-CM | POA: Diagnosis not present
